# Patient Record
Sex: Female | Born: 1965
Health system: Southern US, Community
[De-identification: ages and names within clinical notes are randomized; demographics above are authoritative.]

## PROBLEM LIST (undated history)

## (undated) DIAGNOSIS — F419 Anxiety disorder, unspecified: Secondary | ICD-10-CM

## (undated) DIAGNOSIS — D649 Anemia, unspecified: Secondary | ICD-10-CM

## (undated) DIAGNOSIS — Z808 Family history of malignant neoplasm of other organs or systems: Secondary | ICD-10-CM

## (undated) DIAGNOSIS — Z800 Family history of malignant neoplasm of digestive organs: Secondary | ICD-10-CM

## (undated) DIAGNOSIS — E079 Disorder of thyroid, unspecified: Secondary | ICD-10-CM

## (undated) DIAGNOSIS — J45909 Unspecified asthma, uncomplicated: Secondary | ICD-10-CM

## (undated) DIAGNOSIS — R32 Unspecified urinary incontinence: Secondary | ICD-10-CM

## (undated) HISTORY — DX: Disorder of thyroid, unspecified: E07.9

## (undated) HISTORY — DX: Unspecified asthma, uncomplicated: J45.909

## (undated) HISTORY — DX: Anemia, unspecified: D64.9

## (undated) HISTORY — DX: Family history of malignant neoplasm of other organs or systems: Z80.8

## (undated) HISTORY — DX: Anxiety disorder, unspecified: F41.9

## (undated) HISTORY — DX: Unspecified urinary incontinence: R32

## (undated) HISTORY — DX: Family history of malignant neoplasm of digestive organs: Z80.0

## (undated) HISTORY — PX: ANKLE ARTHROSCOPY WITH RECONSTRUCTION: SHX5583

---

## 2013-12-05 ENCOUNTER — Encounter: Payer: Self-pay | Admitting: Nurse Practitioner

## 2014-02-26 LAB — HM MAMMOGRAPHY

## 2014-11-15 LAB — CBC AND DIFFERENTIAL
HCT: 39 % (ref 36–46)
Hemoglobin: 13 g/dL (ref 12.0–16.0)
Platelets: 213 10*3/uL (ref 150–399)
WBC: 7 10*3/mL

## 2014-11-15 LAB — TSH: TSH: 4.01 u[IU]/mL (ref 0.41–5.90)

## 2014-12-31 LAB — BASIC METABOLIC PANEL
CREATININE: 0.8 mg/dL (ref 0.5–1.1)
Glucose: 93 mg/dL
Potassium: 4 mmol/L (ref 3.4–5.3)
Sodium: 139 mmol/L (ref 137–147)

## 2014-12-31 LAB — CBC AND DIFFERENTIAL
HEMATOCRIT: 38 % (ref 36–46)
Hemoglobin: 12.6 g/dL (ref 12.0–16.0)
PLATELETS: 244 10*3/uL (ref 150–399)
WBC: 8.2 10*3/mL

## 2015-01-07 ENCOUNTER — Encounter: Payer: Self-pay | Admitting: Nurse Practitioner

## 2015-01-07 LAB — TSH: TSH: 3.18 u[IU]/mL (ref 0.41–5.90)

## 2015-07-28 ENCOUNTER — Encounter: Payer: Self-pay | Admitting: Family Medicine

## 2015-07-28 ENCOUNTER — Ambulatory Visit (INDEPENDENT_AMBULATORY_CARE_PROVIDER_SITE_OTHER): Payer: BLUE CROSS/BLUE SHIELD | Admitting: Family Medicine

## 2015-07-28 VITALS — BP 120/80 | HR 71 | Temp 98.3°F | Resp 12 | Ht 62.0 in | Wt 127.0 lb

## 2015-07-28 DIAGNOSIS — J45909 Unspecified asthma, uncomplicated: Secondary | ICD-10-CM | POA: Insufficient documentation

## 2015-07-28 DIAGNOSIS — S82891A Other fracture of right lower leg, initial encounter for closed fracture: Secondary | ICD-10-CM | POA: Diagnosis not present

## 2015-07-28 DIAGNOSIS — M2550 Pain in unspecified joint: Secondary | ICD-10-CM | POA: Diagnosis not present

## 2015-07-28 DIAGNOSIS — F419 Anxiety disorder, unspecified: Secondary | ICD-10-CM

## 2015-07-28 DIAGNOSIS — E039 Hypothyroidism, unspecified: Secondary | ICD-10-CM | POA: Insufficient documentation

## 2015-07-28 DIAGNOSIS — J4520 Mild intermittent asthma, uncomplicated: Secondary | ICD-10-CM

## 2015-07-28 DIAGNOSIS — Z3041 Encounter for surveillance of contraceptive pills: Secondary | ICD-10-CM

## 2015-07-28 DIAGNOSIS — G2581 Restless legs syndrome: Secondary | ICD-10-CM

## 2015-07-28 LAB — BASIC METABOLIC PANEL
BUN: 12 mg/dL (ref 6–23)
CALCIUM: 9.4 mg/dL (ref 8.4–10.5)
CO2: 24 meq/L (ref 19–32)
CREATININE: 0.71 mg/dL (ref 0.40–1.20)
Chloride: 104 mEq/L (ref 96–112)
GFR: 92.03 mL/min (ref >60.00)
Glucose, Bld: 83 mg/dL (ref 70–99)
Potassium: 4 mEq/L (ref 3.5–5.1)
SODIUM: 138 meq/L (ref 135–145)

## 2015-07-28 LAB — TSH: TSH: 1.39 u[IU]/mL (ref 0.35–4.50)

## 2015-07-28 LAB — T3, FREE: T3 FREE: 2.7 pg/mL (ref 2.3–4.2)

## 2015-07-28 MED ORDER — FLUOXETINE HCL 90 MG PO CPDR: 90.0000 mg | DELAYED_RELEASE_CAPSULE | ORAL | Status: DC

## 2015-07-28 MED ORDER — FLUTICASONE-SALMETEROL 250-50 MCG/DOSE IN AEPB
1.0000 | INHALATION_SPRAY | Freq: Two times a day (BID) | RESPIRATORY_TRACT | Status: DC
Start: 2015-07-28 — End: 2015-08-04

## 2015-07-28 MED ORDER — NORETHINDRONE 0.35 MG PO TABS: 1.0000 | ORAL_TABLET | Freq: Every day | ORAL | Status: DC

## 2015-07-28 MED ORDER — ALBUTEROL SULFATE HFA 108 (90 BASE) MCG/ACT IN AERS: 2.0000 | INHALATION_SPRAY | Freq: Four times a day (QID) | RESPIRATORY_TRACT | Status: DC | PRN

## 2015-07-28 MED ORDER — LEVOTHYROXINE SODIUM 88 MCG PO TABS: 88.0000 ug | ORAL_TABLET | Freq: Every day | ORAL | Status: DC

## 2015-07-28 MED ORDER — ROPINIROLE HCL 0.25 MG PO TABS: 0.5000 mg | ORAL_TABLET | Freq: Every day | ORAL | Status: DC

## 2015-07-28 NOTE — Patient Instructions (Addendum)
A few things to remember from today's visit:   1. Ankle fracture, right  - Ambulatory referral to Orthopedic Surgery  2. Primary hypothyroidism  - TSH - T3, free - Basic metabolic panel - levothyroxine (SYNTHROID, LEVOTHROID) 88 MCG tablet; Take 1 tablet (88 mcg total) by mouth daily before breakfast.  Dispense: 90 tablet; Refill: 1  3. Restless legs syndrome (RLS)  - rOPINIRole (REQUIP) 0.25 MG tablet; Take 2 tablets (0.5 mg total) by mouth at bedtime.  Dispense: 180 tablet; Refill: 1  4. Extrinsic asthma, mild intermittent, uncomplicated  - Fluticasone-Salmeterol (ADVAIR) 250-50 MCG/DOSE AEPB; Inhale 1 puff into the lungs 2 (two) times daily.  Dispense: 60 each; Refill: 2 - albuterol (PROVENTIL HFA;VENTOLIN HFA) 108 (90 Base) MCG/ACT inhaler; Inhale 2 puffs into the lungs every 6 (six) hours as needed for wheezing or shortness of breath.  Dispense: 2 Inhaler; Refill: 2  5. Encounter for surveillance of contraceptive pills  - norethindrone (NORA-BE) 0.35 MG tablet; Take 1 tablet (0.35 mg total) by mouth daily.  Dispense: 3 Package; Refill: 0  6. Anxiety disorder, unspecified  - FLUoxetine (PROZAC WEEKLY) 90 MG DR capsule; Take 1 capsule (90 mg total) by mouth every 7 (seven) days.  Dispense: 13 capsule; Refill: 0   Ashley Murphy may help with joint pain. Ortho referral placed.  Fall precaution.    If you sign-up for My chart, you can communicate easier with [place] in case you have any question or concern.

## 2015-07-28 NOTE — Progress Notes (Signed)
HPI:   Ashley Murphy is a 50 y.o. female, who is here today to establish care with me, moved from [place].   Former PCP: Dr Waunita Schooner. Last preventive routine visit: 08/2014 with gyn.  Hx of asthma, allergic rhinitis, arthralgias, anxiety that exacerbates a "skin condition": granuloma annularis. Also RLS, currently she is on Requip and has taken it for about 2 years, takes 2 tabs at bedtime, which helps some but still has occasional symptoms.    Concerns today: "weak bones", Knees "gave up". Arthralgias mainly hips,lower back, and knees.Has been going on for years but worse since right ankle fracture in 08/2014.  She is afraid of exercise/walk because feels like ankle is going to twist. She was following with ortho, she needs a referral. Also needs medications refill.  + Stiffness, which is worse after prolonged rest and improve with movement. No edema or erythema. No limitation on daily activities.   Last mammogram according to pt, this year and negative. She is on OCP's, which she has tolerated well, no side effects reported.   Asthma: She is on Advair and Albuterol inh; the latter one she has not need in years but since she moved to this area she has had mild wheezing, more than usual, has not used inhaler.  No cough, dyspnea, or chest pain. Denies severe/frequent headache, visual changes, chest pain, dyspnea, palpitation, claudication, focal weakness, or edema.  + Former smoker.   Hypothyroidism:  Currently she is on Levothyroxine 88 mcg daily. Tolerating medication well, no side effects reported. She has not noted dysphagia, palpitations, abdominal pain, changes in bowel habits, tremor, cold/heat intolerance, or abnormal weight loss.    Anxiety:  Currently she is on Fluoxetine 90 mg weekly, has taken it for 15-20 years for anxiety. Prior medications include: None Hospitalizations due to psychiatric disorders: Denies Hx of bipolar disorder  Denies. Insomnia/sleep disorder occasional but attributed to joint and back aching. Suicidal thoughts/ideation: Denies  She states that the main reason she is taking the medications because anxiety/stress exacerbates skin rash. Mother possible bipolar and son was Dx with bipolar disorder.    Current symptoms: None reported.    Review of Systems  Constitutional: Negative for fever, activity change, appetite change, fatigue and unexpected weight change.  HENT: Negative for facial swelling, mouth sores, nosebleeds and trouble swallowing.   Eyes: Negative for photophobia, pain and visual disturbance.  Respiratory: Positive for wheezing. Negative for cough and shortness of breath.   Cardiovascular: Negative for chest pain, palpitations and leg swelling.  Gastrointestinal: Negative for nausea, vomiting and abdominal pain.  Endocrine: Negative for cold intolerance and heat intolerance.  Genitourinary: Negative for dysuria, vaginal bleeding, vaginal discharge and menstrual problem (LMP 07/15/15).  Musculoskeletal: Positive for back pain and arthralgias. Negative for myalgias, joint swelling and gait problem.  Skin: Negative for color change and wound.  Neurological: Negative for syncope, weakness, numbness and headaches.  Hematological: Does not bruise/bleed easily.  Psychiatric/Behavioral: Positive for sleep disturbance (due to pain, occasional). Negative for hallucinations and confusion. The patient is nervous/anxious.       No current outpatient prescriptions on file prior to visit.   No current facility-administered medications on file prior to visit.     Past Medical History  Diagnosis Date  . Asthma    Not on File  Family History  Problem Relation Age of Onset  . Arthritis Mother   . Cancer Mother   . Arthritis Father   . Cancer Paternal Grandfather  Social History   Social History  . Marital Status: Married    Spouse Name: N/A  . Number of Children: N/A  .  Years of Education: N/A   Social History Main Topics  . Smoking status: Former Research scientist (life sciences)  . Smokeless tobacco: None     Comment: Quit 27 years ago  . Alcohol Use: None  . Drug Use: None  . Sexual Activity: Not Asked   Other Topics Concern  . None   Social History Narrative  . None    Filed Vitals:   07/28/15 0904  BP: 120/80  Pulse: 71  Temp: 98.3 F (36.8 C)  Resp: 12    Body mass index is 23.22 kg/(m^2).  SpO2 Readings from Last 3 Encounters:  07/28/15 98%       Physical Exam  Constitutional: She is oriented to person, place, and time. She appears well-developed and well-nourished. No distress.  HENT:  Head: Atraumatic.  Eyes: Conjunctivae and EOM are normal. Pupils are equal, round, and reactive to light.  Neck: No muscular tenderness present. No thyroid mass and no thyromegaly present.  Cardiovascular: Normal rate and regular rhythm.   No murmur heard. Pulses:      Dorsalis pedis pulses are 2+ on the right side, and 2+ on the left side.  Respiratory: Effort normal and breath sounds normal. No respiratory distress.  GI: Soft. She exhibits no mass. There is no tenderness.  Musculoskeletal: She exhibits no edema.   No tenderness upon palpation of paraspinal muscles. No trigger points. Knee crepitus, moderate, no effusion or erythema, stable. Hips adequate ROM, left hip ROM elicits mild pain. Right ankle mild limitation dorsal flexion, pain with palpation of medial peri malleolar area, no edema or erythema.   Lymphadenopathy:    She has no cervical adenopathy.  Neurological: She is alert and oriented to person, place, and time. She has normal strength.  SLR negative bilateral. Stable gait, antalgic.  Skin: Skin is warm. No erythema.  Psychiatric: Her speech is normal. Her mood appears anxious.  Well groomed, good eye contact.      ASSESSMENT AND PLAN:   Najla was seen today for new patient (initial visit).  Diagnoses and all orders for this  visit:  Polyarthralgia  Chronic, most likely osteoarthritis. [place] is a good option for exercise and may help with OA. We could consider Cymbalta later one but we may need to wean of Fluoxetine. Fall precautions.   Ankle fracture, right  Continue PT exercises at home, referral to Ortho was placed today.  -     Ambulatory referral to Orthopedic Surgery  Primary hypothyroidism  No changes today, further recommendations would be given according to results. F/U in 6-12 months.  -     TSH -     T3, free -     Basic metabolic panel -     levothyroxine (SYNTHROID, LEVOTHROID) 88 MCG tablet; Take 1 tablet (88 mcg total) by mouth daily before breakfast.  Restless legs syndrome (RLS)  Still occasional symptoms. We discussed other treatment options: gabapentin. She prefers to hold on adding medications; so no changes. F/U in 4 months.   -     rOPINIRole (REQUIP) 0.25 MG tablet; Take 2 tablets (0.5 mg total) by mouth at bedtime.  Extrinsic asthma, mild intermittent, uncomplicated  No changes for now, recommend using albuterol inhaler for wheezing as needed. We may need to consider adding Singulair. Follow-up in 4 months.  -     Fluticasone-Salmeterol (ADVAIR) 250-50 MCG/DOSE  AEPB; Inhale 1 puff into the lungs 2 (two) times daily. -     albuterol (PROVENTIL HFA;VENTOLIN HFA) 108 (90 Base) MCG/ACT inhaler; Inhale 2 puffs into the lungs every 6 (six) hours as needed for wheezing or shortness of breath.  Encounter for surveillance of contraceptive pills  We discussed some side effects of immunotherapy, including thrombotic episodes. I agree refilling OCP's.  -     norethindrone (NORA-BE) 0.35 MG tablet; Take 1 tablet (0.35 mg total) by mouth daily.  Anxiety disorder, unspecified  Stable. No changes in current management. F/U in 6-12 months.  -     FLUoxetine (PROZAC WEEKLY) 90 MG DR capsule; Take 1 capsule (90 mg total) by mouth every 7 (seven) days.       -PMHx  reviewed. -Will obtain records from former PCP.        Adelei Scobey G. [place], MD  [facility].

## 2015-07-28 NOTE — Progress Notes (Signed)
Pre visit review using our clinic review tool, if applicable. No additional management support is needed unless otherwise documented below in the visit note. 

## 2015-07-30 ENCOUNTER — Encounter: Payer: Self-pay | Admitting: Family Medicine

## 2015-08-01 ENCOUNTER — Telehealth: Payer: Self-pay | Admitting: Family Medicine

## 2015-08-01 DIAGNOSIS — J4520 Mild intermittent asthma, uncomplicated: Secondary | ICD-10-CM

## 2015-08-01 NOTE — Telephone Encounter (Signed)
Ashley Murphy w/[facility] would like to know if they can increase the Advair prescription from a 30day supply to a 90day supply.

## 2015-08-04 ENCOUNTER — Encounter: Payer: Self-pay | Admitting: Family Medicine

## 2015-08-04 MED ORDER — FLUTICASONE-SALMETEROL 250-50 MCG/DOSE IN AEPB: 1.0000 | INHALATION_SPRAY | Freq: Two times a day (BID) | RESPIRATORY_TRACT | Status: DC

## 2015-08-04 NOTE — Telephone Encounter (Signed)
Rx for 90 days sent in.

## 2015-10-21 ENCOUNTER — Telehealth: Payer: Self-pay

## 2015-10-21 NOTE — Telephone Encounter (Signed)
This is fine. Thanks, BJ

## 2015-10-21 NOTE — Telephone Encounter (Signed)
Patient is requesting to tranfer to [facility] She did not feel she had good chemistry with the [place] office.  Dr. Martinique is that ok with you? Wilfred Lacy is that ok with you?

## 2015-10-24 NOTE — Telephone Encounter (Signed)
Ok with me 

## 2015-10-24 NOTE — Telephone Encounter (Signed)
Patient has an app on 10/9 @9am 

## 2015-11-17 ENCOUNTER — Ambulatory Visit: Payer: BLUE CROSS/BLUE SHIELD | Admitting: Nurse Practitioner

## 2015-11-20 ENCOUNTER — Ambulatory Visit (INDEPENDENT_AMBULATORY_CARE_PROVIDER_SITE_OTHER): Payer: BLUE CROSS/BLUE SHIELD | Admitting: Nurse Practitioner

## 2015-11-20 ENCOUNTER — Encounter: Payer: Self-pay | Admitting: Nurse Practitioner

## 2015-11-20 ENCOUNTER — Other Ambulatory Visit (INDEPENDENT_AMBULATORY_CARE_PROVIDER_SITE_OTHER): Payer: BLUE CROSS/BLUE SHIELD

## 2015-11-20 ENCOUNTER — Other Ambulatory Visit: Payer: Self-pay | Admitting: *Deleted

## 2015-11-20 VITALS — BP 112/76 | HR 88 | Temp 98.2°F | Ht 61.0 in | Wt 124.0 lb

## 2015-11-20 DIAGNOSIS — E039 Hypothyroidism, unspecified: Secondary | ICD-10-CM

## 2015-11-20 DIAGNOSIS — Z0000 Encounter for general adult medical examination without abnormal findings: Secondary | ICD-10-CM | POA: Diagnosis not present

## 2015-11-20 DIAGNOSIS — Z3041 Encounter for surveillance of contraceptive pills: Secondary | ICD-10-CM

## 2015-11-20 DIAGNOSIS — Z23 Encounter for immunization: Secondary | ICD-10-CM

## 2015-11-20 DIAGNOSIS — J4520 Mild intermittent asthma, uncomplicated: Secondary | ICD-10-CM

## 2015-11-20 DIAGNOSIS — N951 Menopausal and female climacteric states: Secondary | ICD-10-CM

## 2015-11-20 DIAGNOSIS — G2581 Restless legs syndrome: Secondary | ICD-10-CM

## 2015-11-20 LAB — COMPREHENSIVE METABOLIC PANEL
ALK PHOS: 45 U/L (ref 39–117)
ALT: 10 U/L (ref 0–35)
AST: 14 U/L (ref 0–37)
Albumin: 4.1 g/dL (ref 3.5–5.2)
BILIRUBIN TOTAL: 0.6 mg/dL (ref 0.2–1.2)
BUN: 17 mg/dL (ref 6–23)
CALCIUM: 9.3 mg/dL (ref 8.4–10.5)
CO2: 28 mEq/L (ref 19–32)
CREATININE: 0.78 mg/dL (ref 0.40–1.20)
Chloride: 105 mEq/L (ref 96–112)
GFR: 82.46 mL/min (ref >60.00)
GLUCOSE: 78 mg/dL (ref 70–99)
Potassium: 4.5 mEq/L (ref 3.5–5.1)
Sodium: 139 mEq/L (ref 135–145)
TOTAL PROTEIN: 7.2 g/dL (ref 6.0–8.3)

## 2015-11-20 LAB — LIPID PANEL
CHOLESTEROL: 178 mg/dL (ref 0–200)
HDL: 58.1 mg/dL (ref >39.00)
LDL CALC: 105 mg/dL — AB (ref 0–99)
NonHDL: 119.9
Total CHOL/HDL Ratio: 3
Triglycerides: 74 mg/dL (ref 0.0–149.0)
VLDL: 14.8 mg/dL (ref 0.0–40.0)

## 2015-11-20 LAB — CBC WITH DIFFERENTIAL/PLATELET
BASOS ABS: 0 10*3/uL (ref 0.0–0.1)
Basophils Relative: 0.2 % (ref 0.0–3.0)
EOS ABS: 0.1 10*3/uL (ref 0.0–0.7)
Eosinophils Relative: 0.7 % (ref 0.0–5.0)
HCT: 37.4 % (ref 36.0–46.0)
Hemoglobin: 12.8 g/dL (ref 12.0–15.0)
LYMPHS ABS: 1.6 10*3/uL (ref 0.7–4.0)
Lymphocytes Relative: 20.3 % (ref 12.0–46.0)
MCHC: 34.2 g/dL (ref 30.0–36.0)
MCV: 92.5 fl (ref 78.0–100.0)
MONO ABS: 0.6 10*3/uL (ref 0.1–1.0)
Monocytes Relative: 8.1 % (ref 3.0–12.0)
NEUTROS ABS: 5.6 10*3/uL (ref 1.4–7.7)
NEUTROS PCT: 70.7 % (ref 43.0–77.0)
PLATELETS: 215 10*3/uL (ref 150.0–400.0)
RBC: 4.04 Mil/uL (ref 3.87–5.11)
RDW: 12.7 % (ref 11.5–15.5)
WBC: 7.9 10*3/uL (ref 4.0–10.5)

## 2015-11-20 LAB — TSH: TSH: 3.06 u[IU]/mL (ref 0.35–4.50)

## 2015-11-20 LAB — T4, FREE: FREE T4: 0.92 ng/dL (ref 0.60–1.60)

## 2015-11-20 MED ORDER — ROPINIROLE HCL 0.25 MG PO TABS: 0.5000 mg | ORAL_TABLET | Freq: Every day | ORAL | 1 refills | Status: DC

## 2015-11-20 MED ORDER — ALBUTEROL SULFATE HFA 108 (90 BASE) MCG/ACT IN AERS: 1.0000 | INHALATION_SPRAY | Freq: Four times a day (QID) | RESPIRATORY_TRACT | 5 refills | Status: DC | PRN

## 2015-11-20 MED ORDER — LEVOTHYROXINE SODIUM 88 MCG PO TABS: 88.0000 ug | ORAL_TABLET | Freq: Every day | ORAL | 1 refills | Status: DC

## 2015-11-20 MED ORDER — FLUOXETINE HCL 90 MG PO CPDR: 90.0000 mg | DELAYED_RELEASE_CAPSULE | ORAL | 1 refills | Status: DC

## 2015-11-20 MED ORDER — NORETHINDRONE 0.35 MG PO TABS: 1.0000 | ORAL_TABLET | Freq: Every day | ORAL | 1 refills | Status: DC

## 2015-11-20 MED ORDER — FLUTICASONE-SALMETEROL 250-50 MCG/DOSE IN AEPB: 1.0000 | INHALATION_SPRAY | Freq: Two times a day (BID) | RESPIRATORY_TRACT | 1 refills | Status: DC

## 2015-11-20 NOTE — Patient Instructions (Signed)
Please sign medical release for old medical records.

## 2015-11-20 NOTE — Progress Notes (Unsigned)
Pre visit review using our clinic review tool, if applicable. No additional management support is needed unless otherwise documented below in the visit note. 

## 2015-11-20 NOTE — Progress Notes (Signed)
Pre visit review using our clinic review tool, if applicable. No additional management support is needed unless otherwise documented below in the visit note. 

## 2015-11-20 NOTE — Progress Notes (Signed)
Subjective:    Patient ID: Ashley Murphy, female    DOB: [birth year], 50 y.o.   MRN: [id number]  Patient presents today for complete physical and medication refill  HPI  Immunizations: (TDAP, Hep C screen, Pneumovax, Influenza, zoster)  Health Maintenance  Topic Date Due  .  Hepatitis C: One time screening is recommended by Center for Disease Control  (CDC) for  adults born from [birth year] through 1967.   1965-07-18  . Pap Smear  12/27/1984  . Colon Cancer Screening  12/27/2013  . HIV Screening  11/08/2016*  . Mammogram  02/09/2017  . Tetanus Vaccine  08/08/2024  . Flu Shot  Completed  *Topic was postponed. The date shown is not the original due date.   Diet:healthy Weight:  Wt Readings from Last 3 Encounters:  11/20/15 124 lb (56.2 kg)  07/28/15 127 lb (57.6 kg)   Exercise:unable to exercise due to right ankle injury 2016 Fall Risk: accidental fall 2016 with right ankle fracture. Fall Risk  11/20/2015  Falls in the past year? No   Depression/Suicide: Depression screen PHQ 2/9 11/20/2015  Decreased Interest 0  Down, Depressed, Hopeless 0  PHQ - 2 Score 0   No flowsheet data found. Colonoscopy (every 5-23yr, >50-7361yr:needed Pap Smear (every 3y32yror >21-29 without HPV, every 61yr42yrr >30-661yr12yrh HPV):up to date per patient, last done 3years ago, normal per patient, records requested. Mammogram (yearly, >461yrs84yrto date per patient, last done 2017, normal per patient. Vision:up to date Dental:up to date Advanced Directive: Advanced Directives 11/20/2015  Does patient have an advance directive? No;Yes  Type of Advance Directive Living will   Sexual History (birth control, marital status, STD):sexually active, married, current use of OCP, post menopausal.   Medications and allergies reviewed with patient and updated if appropriate.  Patient Active Problem List   Diagnosis Date Noted  . Menopausal symptoms 11/21/2015  . Ankle fracture, right 07/28/2015  .  Primary hypothyroidism 07/28/2015  . Restless legs syndrome (RLS) 07/28/2015  . Extrinsic asthma 07/28/2015  . Polyarthralgia 07/28/2015    No current outpatient prescriptions on file prior to visit.   No current facility-administered medications on file prior to visit.     Past Medical History:  Diagnosis Date  . Asthma     Past Surgical History:  Procedure Laterality Date  . ANKLE ARTHROSCOPY WITH RECONSTRUCTION Right     Social History   Social History  . Marital status: Married    Spouse name: N/A  . Number of children: N/A  . Years of education: N/A   Social History Main Topics  . 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 Alcohol use 0.0 oz/week     Comment: socially  . Drug use: No  . Sexual activity: Yes    Birth control/ protection: Pill   Other Topics Concern  . None   Social History Narrative  . None    Family History  Problem Relation Age of Onset  . Arthritis Mother   . Cancer Mother   . Arthritis Father   . Heart disease Father   . Hypertension Father   . Hyperlipidemia Father   . Cancer Paternal Grandfather   . Alzheimer's disease Paternal Grandmother         Review of Systems  Constitutional: Negative for fever, malaise/fatigue and weight loss.  HENT: Negative for congestion and sore throat.   Eyes:  Negative for visual changes  Respiratory: Negative for cough and shortness of breath.   Cardiovascular: Negative for chest pain, palpitations and leg swelling.  Gastrointestinal: Negative for abdominal pain, blood in stool, constipation, diarrhea, heartburn, nausea and vomiting.  Genitourinary: Negative for dysuria, frequency and urgency.  Musculoskeletal: Positive for joint pain. Negative for falls and myalgias.       Ankle and hips since right ankle fracture and reconstruction in 2016. Managed by orthopedist at this time.  Skin: Negative for rash.  Neurological: Negative  for dizziness, sensory change and headaches.  Endo/Heme/Allergies: Does not bruise/bleed easily.  Psychiatric/Behavioral: Negative for depression, substance abuse and suicidal ideas. The patient is not nervous/anxious.     Objective:   Vitals:   11/20/15 0816  BP: 112/76  Pulse: 88  Temp: 98.2 F (36.8 C)    Body mass index is 23.43 kg/m.   Physical Examination:  Physical Exam  Constitutional: She is oriented to person, place, and time and well-developed, well-nourished, and in no distress. No distress.  HENT:  Right Ear: External ear normal.  Left Ear: External ear normal.  Nose: Nose normal.  Mouth/Throat: Oropharynx is clear and moist. No oropharyngeal exudate.  Eyes: Conjunctivae and EOM are normal. Pupils are equal, round, and reactive to light. No scleral icterus.  Neck: Normal range of motion. Neck supple. No thyromegaly present.  Cardiovascular: Normal rate, normal heart sounds and intact distal pulses.   Pulmonary/Chest: Effort normal and breath sounds normal. She exhibits no tenderness.  Abdominal: Soft. Bowel sounds are normal. She exhibits no distension. There is no tenderness.  Musculoskeletal: She exhibits no edema.       Right ankle: She exhibits decreased range of motion. She exhibits no swelling, no ecchymosis and no deformity. Tenderness. Achilles tendon normal.       Feet:  Musculoskeletal exam normal except right ankle as documented  Lymphadenopathy:    She has no cervical adenopathy.  Neurological: She is alert and oriented to person, place, and time. She has normal reflexes. No cranial nerve deficit. Coordination normal.  Limping gait due to right ankle instability.  Skin: Skin is warm and dry.  Psychiatric: Affect and judgment normal.  Vitals reviewed.   ASSESSMENT and PLAN:  Marquisa was seen today for annual exam.  Diagnoses and all orders for this visit:  Encounter for preventative adult health care examination -     Comp Met (CMET);  Future -     CBC w/Diff; Future -     Lipid panel; Future -     Hepatitis C Antibody; Future -     Ambulatory referral to Gastroenterology  Need for prophylactic vaccination and inoculation against influenza -     Flu Vaccine QUAD 36+ mos IM  Mild intermittent extrinsic asthma without complication -     Fluticasone-Salmeterol (ADVAIR) 250-50 MCG/DOSE AEPB; Inhale 1 puff into the lungs 2 (two) times daily. -     albuterol (PROVENTIL HFA;VENTOLIN HFA) 108 (90 Base) MCG/ACT inhaler; Inhale 1-2 puffs into the lungs every 6 (six) hours as needed for wheezing or shortness of breath.  Primary hypothyroidism -     TSH; Future -     T4, free; Future -     levothyroxine (SYNTHROID, LEVOTHROID) 88 MCG tablet; Take 1 tablet (88 mcg total) by mouth daily before breakfast.  Restless legs syndrome (RLS) -     rOPINIRole (REQUIP) 0.25 MG tablet; Take 2 tablets (0.5 mg total) by mouth at bedtime.  Encounter for surveillance of contraceptive  pills -     norethindrone (NORA-BE) 0.35 MG tablet; Take 1 tablet (0.35 mg total) by mouth daily.  Extrinsic asthma, mild intermittent, uncomplicated  Menopausal symptoms -     norethindrone (NORA-BE) 0.35 MG tablet; Take 1 tablet (0.35 mg total) by mouth daily. -     FLUoxetine (PROZAC WEEKLY) 90 MG DR capsule; Take 1 capsule (90 mg total) by mouth every 7 (seven) days.    Primary hypothyroidism Maintain current dose of synthroid  Extrinsic asthma No acute symptoms, controlled Continue current inhalers  Restless legs syndrome (RLS) Continue requip  Menopausal symptoms Continue progesterone and prozac as prescribed  Recent Results (from the past 2160 hour(s))  Comp Met (CMET)     Status: None   Collection Time: 11/20/15  9:25 AM  Result Value Ref Range   Sodium 139 135 - 145 mEq/L   Potassium 4.5 3.5 - 5.1 mEq/L   Chloride 105 96 - 112 mEq/L   CO2 28 19 - 32 mEq/L   Glucose, Bld 78 70 - 99 mg/dL   BUN 17 6 - 23 mg/dL   Creatinine, Ser 0.78  0.40 - 1.20 mg/dL   Total Bilirubin 0.6 0.2 - 1.2 mg/dL   Alkaline Phosphatase 45 39 - 117 U/L   AST 14 0 - 37 U/L   ALT 10 0 - 35 U/L   Total Protein 7.2 6.0 - 8.3 g/dL   Albumin 4.1 3.5 - 5.2 g/dL   Calcium 9.3 8.4 - 10.5 mg/dL   GFR 82.46 >60.00 mL/min  CBC w/Diff     Status: None   Collection Time: 11/20/15  9:25 AM  Result Value Ref Range   WBC 7.9 4.0 - 10.5 K/uL   RBC 4.04 3.87 - 5.11 Mil/uL   Hemoglobin 12.8 12.0 - 15.0 g/dL   HCT 37.4 36.0 - 46.0 %   MCV 92.5 78.0 - 100.0 fl   MCHC 34.2 30.0 - 36.0 g/dL   RDW 12.7 11.5 - 15.5 %   Platelets 215.0 150.0 - 400.0 K/uL   Neutrophils Relative % 70.7 43.0 - 77.0 %   Lymphocytes Relative 20.3 12.0 - 46.0 %   Monocytes Relative 8.1 3.0 - 12.0 %   Eosinophils Relative 0.7 0.0 - 5.0 %   Basophils Relative 0.2 0.0 - 3.0 %   Neutro Abs 5.6 1.4 - 7.7 K/uL   Lymphs Abs 1.6 0.7 - 4.0 K/uL   Monocytes Absolute 0.6 0.1 - 1.0 K/uL   Eosinophils Absolute 0.1 0.0 - 0.7 K/uL   Basophils Absolute 0.0 0.0 - 0.1 K/uL  TSH     Status: None   Collection Time: 11/20/15  9:25 AM  Result Value Ref Range   TSH 3.06 0.35 - 4.50 uIU/mL  T4, free     Status: None   Collection Time: 11/20/15  9:25 AM  Result Value Ref Range   Free T4 0.92 0.60 - 1.60 ng/dL    Comment: Specimens from patients who are undergoing biotin therapy and /or ingesting biotin supplements may contain high levels of biotin.  The higher biotin concentration in these specimens interferes with this Free T4 assay.  Specimens that contain high levels  of biotin may cause false high results for this Free T4 assay.  Please interpret results in light of the total clinical presentation of the patient.    Lipid panel     Status: Abnormal   Collection Time: 11/20/15  9:25 AM  Result Value Ref Range   Cholesterol 178 0 -  200 mg/dL    Comment: ATP III Classification       Desirable:  < 200 mg/dL               Borderline High:  200 - 239 mg/dL          High:  > = 240 mg/dL   Triglycerides  74.0 0.0 - 149.0 mg/dL    Comment: Normal:  <150 mg/dLBorderline High:  150 - 199 mg/dL   HDL 58.10 >39.00 mg/dL   VLDL 14.8 0.0 - 40.0 mg/dL   LDL Cholesterol 105 (H) 0 - 99 mg/dL   Total CHOL/HDL Ratio 3     Comment:                Men          Women1/2 Average Risk     3.4          3.3Average Risk          5.0          4.42X Average Risk          9.6          7.13X Average Risk          15.0          11.0                       NonHDL 119.90     Comment: NOTE:  Non-HDL goal should be 30 mg/dL higher than patient's LDL goal (i.e. LDL goal of < 70 mg/dL, would have non-HDL goal of < 100 mg/dL)  Hepatitis C Antibody     Status: None   Collection Time: 11/20/15  9:25 AM  Result Value Ref Range   HCV Ab NEGATIVE NEGATIVE      Follow up: Return in about 6 months (around 05/20/2016) for hypothyroidism, asthma.  Wilfred Lacy, NP

## 2015-11-21 ENCOUNTER — Other Ambulatory Visit: Payer: Self-pay | Admitting: *Deleted

## 2015-11-21 DIAGNOSIS — N951 Menopausal and female climacteric states: Secondary | ICD-10-CM | POA: Insufficient documentation

## 2015-11-21 DIAGNOSIS — J4520 Mild intermittent asthma, uncomplicated: Secondary | ICD-10-CM

## 2015-11-21 LAB — HEPATITIS C ANTIBODY: HCV AB: NEGATIVE

## 2015-11-21 MED ORDER — ALBUTEROL SULFATE HFA 108 (90 BASE) MCG/ACT IN AERS: 1.0000 | INHALATION_SPRAY | Freq: Four times a day (QID) | RESPIRATORY_TRACT | 1 refills | Status: DC | PRN

## 2015-11-21 NOTE — Telephone Encounter (Signed)
Left msg on triage stating received script for albuterol inhaler, but needing quantity change to a 90 day. Resent script for 90 day,,,/lmb

## 2015-11-21 NOTE — Progress Notes (Signed)
Normal results, see office note

## 2015-11-21 NOTE — Assessment & Plan Note (Signed)
Continue requip

## 2015-11-21 NOTE — Assessment & Plan Note (Addendum)
No acute symptoms, controlled Continue current inhalers

## 2015-11-21 NOTE — Assessment & Plan Note (Signed)
Maintain current dose of synthroid

## 2015-11-21 NOTE — Assessment & Plan Note (Signed)
Continue progesterone and prozac as prescribed

## 2015-11-24 ENCOUNTER — Telehealth: Payer: Self-pay | Admitting: Emergency Medicine

## 2015-11-24 DIAGNOSIS — G2581 Restless legs syndrome: Secondary | ICD-10-CM

## 2015-11-24 NOTE — Telephone Encounter (Signed)
Ok, to resend prescription for 90days. With 1refill. Thank you

## 2015-11-24 NOTE — Telephone Encounter (Signed)
Pharmacy called and wants to know if the patient can get a 90 day supply of rOPINIRole (REQUIP) 0.25 MG tablet instead of 45 day. You can call and give a verbal order. The order number is [id number]. Thanks.

## 2015-11-25 MED ORDER — ROPINIROLE HCL 0.25 MG PO TABS: 0.5000 mg | ORAL_TABLET | Freq: Every day | ORAL | 1 refills | Status: DC

## 2015-11-25 NOTE — Telephone Encounter (Signed)
Done. See meds.  

## 2015-11-25 NOTE — Addendum Note (Signed)
Addended by: Cresenciano Lick on: 11/25/2015 04:58 PM   Modules accepted: Orders

## 2015-11-28 ENCOUNTER — Encounter: Payer: Self-pay | Admitting: Gastroenterology

## 2015-11-28 ENCOUNTER — Ambulatory Visit: Payer: BLUE CROSS/BLUE SHIELD | Admitting: Family Medicine

## 2016-01-14 ENCOUNTER — Ambulatory Visit (AMBULATORY_SURGERY_CENTER): Payer: Self-pay | Admitting: *Deleted

## 2016-01-14 VITALS — Ht 61.0 in | Wt 126.0 lb

## 2016-01-14 DIAGNOSIS — Z1211 Encounter for screening for malignant neoplasm of colon: Secondary | ICD-10-CM

## 2016-01-14 MED ORDER — NA SULFATE-K SULFATE-MG SULF 17.5-3.13-1.6 GM/177ML PO SOLN: ORAL | 0 refills | Status: DC

## 2016-01-14 NOTE — Progress Notes (Signed)
No egg or soy allergy  No home oxygen used  No hx sleep apnea  No diet medications taken  No anesthesia or intubation problems  $15 off coupon given (has [employer])

## 2016-01-16 ENCOUNTER — Encounter: Payer: Self-pay | Admitting: Gastroenterology

## 2016-01-28 ENCOUNTER — Ambulatory Visit (AMBULATORY_SURGERY_CENTER): Payer: BLUE CROSS/BLUE SHIELD | Admitting: Gastroenterology

## 2016-01-28 ENCOUNTER — Other Ambulatory Visit: Payer: Self-pay | Admitting: Gastroenterology

## 2016-01-28 ENCOUNTER — Encounter: Payer: Self-pay | Admitting: Gastroenterology

## 2016-01-28 VITALS — BP 97/62 | HR 68 | Temp 98.4°F | Resp 12 | Ht 61.0 in | Wt 126.0 lb

## 2016-01-28 DIAGNOSIS — K6289 Other specified diseases of anus and rectum: Secondary | ICD-10-CM

## 2016-01-28 DIAGNOSIS — D129 Benign neoplasm of anus and anal canal: Secondary | ICD-10-CM

## 2016-01-28 DIAGNOSIS — Z1212 Encounter for screening for malignant neoplasm of rectum: Secondary | ICD-10-CM | POA: Diagnosis not present

## 2016-01-28 DIAGNOSIS — Z1211 Encounter for screening for malignant neoplasm of colon: Secondary | ICD-10-CM

## 2016-01-28 DIAGNOSIS — D128 Benign neoplasm of rectum: Secondary | ICD-10-CM

## 2016-01-28 MED ORDER — SODIUM CHLORIDE 0.9 % IV SOLN: 500.0000 mL | INTRAVENOUS | Status: DC

## 2016-01-28 NOTE — Progress Notes (Signed)
A/ox3 pleased with MAC, report to [facility]

## 2016-01-28 NOTE — Op Note (Signed)
[place] Patient Name: Ashley Murphy Procedure Date: 01/28/2016 1:26 PM MRN: [id number] Endoscopist: Mauri Pole , MD Age: 50 Referring MD:  Date of Birth: August 08, 1965 Gender: Female Account #: [id number] Procedure:                Colonoscopy Indications:              Screening for colorectal malignant neoplasm, This                            is the patient's first colonoscopy Medicines:                Monitored Anesthesia Care Procedure:                Pre-Anesthesia Assessment:                           - Prior to the procedure, a History and Physical                            was performed, and patient medications and                            allergies were reviewed. The patient's tolerance of                            previous anesthesia was also reviewed. The risks                            and benefits of the procedure and the sedation                            options and risks were discussed with the patient.                            All questions were answered, and informed consent                            was obtained. Prior Anticoagulants: The patient has                            taken no previous anticoagulant or antiplatelet                            agents. ASA Grade Assessment: II - A patient with                            mild systemic disease. After reviewing the risks                            and benefits, the patient was deemed in                            satisfactory condition to undergo the procedure.  After obtaining informed consent, the colonoscope                            was passed under direct vision. Throughout the                            procedure, the patient's blood pressure, pulse, and                            oxygen saturations were monitored continuously. The                            [id number] was introduced through the anus                            and advanced  to the the terminal ileum, with                            identification of the appendiceal orifice and IC                            valve. The colonoscopy was performed without                            difficulty. The patient tolerated the procedure                            well. The quality of the bowel preparation was                            excellent. The terminal ileum, ileocecal valve,                            appendiceal orifice, and rectum were photographed. Scope In: 1:41:33 PM Scope Out: 2:12:04 PM Scope Withdrawal Time: 0 hours 20 minutes 6 seconds  Total Procedure Duration: 0 hours 30 minutes 31 seconds  Findings:                 The perianal and digital rectal examinations were                            normal.                           Non-bleeding internal hemorrhoids were found during                            retroflexion. The hemorrhoids were small.                           Anal papilla(e) were hypertrophied. Biopsies were                            taken with a cold forceps for histology.  The exam was otherwise without abnormality. Complications:            No immediate complications. Estimated Blood Loss:     Estimated blood loss was minimal. Impression:               - Non-bleeding internal hemorrhoids.                           - Anal papilla(e) were hypertrophied. Biopsied.                           - The examination was otherwise normal. Recommendation:           - Patient has a contact number available for                            emergencies. The signs and symptoms of potential                            delayed complications were discussed with the                            patient. Return to normal activities tomorrow.                            Written discharge instructions were provided to the                            patient.                           - Resume previous diet.                           - Continue  present medications.                           - Await pathology results.                           - Repeat colonoscopy for surveillance based on                            pathology results.                           - Return to GI clinic PRN. Mauri Pole, MD 01/28/2016 2:20:50 PM This report has been signed electronically.

## 2016-01-28 NOTE — Progress Notes (Signed)
Called to room to assist during endoscopic procedure.  Patient ID and intended procedure confirmed with present staff. Received instructions for my participation in the procedure from the performing physician.  

## 2016-01-28 NOTE — Patient Instructions (Signed)
YOU HAD AN ENDOSCOPIC PROCEDURE TODAY AT [facility]:   Refer to the procedure report that was given to you for any specific questions about what was found during the examination.  If the procedure report does not answer your questions, please call your gastroenterologist to clarify.  If you requested that your care partner not be given the details of your procedure findings, then the procedure report has been included in a sealed envelope for you to review at your convenience later.  YOU SHOULD EXPECT: Some feelings of bloating in the abdomen. Passage of more gas than usual.  Walking can help get rid of the air that was put into your GI tract during the procedure and reduce the bloating. If you had a lower endoscopy (such as a colonoscopy or flexible sigmoidoscopy) you may notice spotting of blood in your stool or on the toilet paper. If you underwent a bowel prep for your procedure, you may not have a normal bowel movement for a few days.  Please Note:  You might notice some irritation and congestion in your nose or some drainage.  This is from the oxygen used during your procedure.  There is no need for concern and it should clear up in a day or so.  SYMPTOMS TO REPORT IMMEDIATELY:   Following lower endoscopy (colonoscopy or flexible sigmoidoscopy):  Excessive amounts of blood in the stool  Significant tenderness or worsening of abdominal pains  Swelling of the abdomen that is new, acute  Fever of 100F or higher  For urgent or emergent issues, a gastroenterologist can be reached at any hour by calling [phone].   DIET:  We do recommend a small meal at first, but then you may proceed to your regular diet.  Drink plenty of fluids but you should avoid alcoholic beverages for 24 hours.  ACTIVITY:  You should plan to take it easy for the rest of today and you should NOT DRIVE or use heavy machinery until tomorrow (because of the sedation medicines used during the test).     FOLLOW UP: Our staff will call the number listed on your records the next business day following your procedure to check on you and address any questions or concerns that you may have regarding the information given to you following your procedure. If we do not reach you, we will leave a message.  However, if you are feeling well and you are not experiencing any problems, there is no need to return our call.  We will assume that you have returned to your regular daily activities without incident.  If any biopsies were taken you will be contacted by phone or by letter within the next 1-3 weeks.  Please call us at [phone] if you have not heard about the biopsies in 3 weeks.    SIGNATURES/CONFIDENTIALITY: You and/or your care partner have signed paperwork which will be entered into your electronic medical record.  These signatures attest to the fact that that the information above on your After Visit Summary has been reviewed and is understood.  Full responsibility of the confidentiality of this discharge information lies with you and/or your care-partner.  Hemorrhoids (handout given) Await biopsy results to determined next Colonoscopy

## 2016-01-29 ENCOUNTER — Telehealth: Payer: Self-pay

## 2016-01-29 NOTE — Telephone Encounter (Signed)
Left message on answering machine. 

## 2016-02-05 ENCOUNTER — Encounter: Payer: Self-pay | Admitting: Gastroenterology

## 2016-02-23 ENCOUNTER — Encounter: Payer: Self-pay | Admitting: Nurse Practitioner

## 2016-02-23 NOTE — Progress Notes (Unsigned)
Abstracted result and sent to scan  

## 2016-04-15 ENCOUNTER — Encounter: Payer: Self-pay | Admitting: Nurse Practitioner

## 2016-04-15 ENCOUNTER — Ambulatory Visit (INDEPENDENT_AMBULATORY_CARE_PROVIDER_SITE_OTHER): Payer: BLUE CROSS/BLUE SHIELD | Admitting: Nurse Practitioner

## 2016-04-15 ENCOUNTER — Other Ambulatory Visit: Payer: BLUE CROSS/BLUE SHIELD

## 2016-04-15 VITALS — BP 110/84 | HR 73 | Temp 98.1°F | Ht 61.0 in | Wt 130.0 lb

## 2016-04-15 DIAGNOSIS — M791 Myalgia, unspecified site: Secondary | ICD-10-CM

## 2016-04-15 DIAGNOSIS — E039 Hypothyroidism, unspecified: Secondary | ICD-10-CM | POA: Diagnosis not present

## 2016-04-15 DIAGNOSIS — R5383 Other fatigue: Secondary | ICD-10-CM

## 2016-04-15 DIAGNOSIS — M2550 Pain in unspecified joint: Secondary | ICD-10-CM | POA: Diagnosis not present

## 2016-04-15 NOTE — Progress Notes (Signed)
Pre visit review using our clinic review tool, if applicable. No additional management support is needed unless otherwise documented below in the visit note. 

## 2016-04-15 NOTE — Patient Instructions (Addendum)
You will be called with lab results.  You nay use naproxen 220mg  oral 2tabs every 12hrs prn for pain. (take with food).   OA vs RA vs fibromyalgia?  Prescribe oral prednisone vs meloxicam vs cymbalta vs lyrica.  Arthritis Arthritis is a term that is commonly used to refer to joint pain or joint disease. There are more than 100 types of arthritis. What are the causes? The most common cause of this condition is wear and tear of a joint. Other causes include:  Gout.  Inflammation of a joint.  An infection of a joint.  Sprains and other injuries near the joint.  A drug reaction or allergic reaction. In some cases, the cause may not be known. What are the signs or symptoms? The main symptom of this condition is pain in the joint with movement. Other symptoms include:  Redness, swelling, or stiffness at a joint.  Warmth coming from the joint.  Fever.  Overall feeling of illness. How is this diagnosed? This condition may be diagnosed with a physical exam and tests, including:  Blood tests.  Urine tests.  Imaging tests, such as MRI, X-rays, or a CT scan. Sometimes, fluid is removed from a joint for testing. How is this treated? Treatment for this condition may involve:  Treatment of the cause, if it is known.  Rest.  Raising (elevating) the joint.  Applying cold or hot packs to the joint.  Medicines to improve symptoms and reduce inflammation.  Injections of a steroid such as cortisone into the joint to help reduce pain and inflammation. Depending on the cause of your arthritis, you may need to make lifestyle changes to reduce stress on your joint. These changes may include exercising more and losing weight. Follow these instructions at home: Medicines   Take over-the-counter and prescription medicines only as told by your health care provider.  Do not take aspirin to relieve pain if gout is suspected. Activity   Rest your joint if told by your health care  provider. Rest is important when your disease is active and your joint feels painful, swollen, or stiff.  Avoid activities that make the pain worse. It is important to balance activity with rest.  Exercise your joint regularly with range-of-motion exercises as told by your health care provider. Try doing low-impact exercise, such as:  Swimming.  Water aerobics.  Biking.  Walking. Joint Care    If your joint is swollen, keep it elevated if told by your health care provider.  If your joint feels stiff in the morning, try taking a warm shower.  If directed, apply heat to the joint. If you have diabetes, do not apply heat without permission from your health care provider.  Put a towel between the joint and the hot pack or heating pad.  Leave the heat on the area for 20-30 minutes.  If directed, apply ice to the joint:  Put ice in a plastic bag.  Place a towel between your skin and the bag.  Leave the ice on for 20 minutes, 2-3 times per day.  Keep all follow-up visits as told by your health care provider. This is important. Contact a health care provider if:  The pain gets worse.  You have a fever. Get help right away if:  You develop severe joint pain, swelling, or redness.  Many joints become painful and swollen.  You develop severe back pain.  You develop severe weakness in your leg.  You cannot control your bladder or bowels. This  information is not intended to replace advice given to you by your health care provider. Make sure you discuss any questions you have with your health care provider. Document Released: 03/04/2004 Document Revised: 07/03/2015 Document Reviewed: 04/22/2014 Elsevier Interactive Patient Education  2017 [employer].

## 2016-04-15 NOTE — Progress Notes (Signed)
Subjective:  Patient ID: Ashley Murphy, female    DOB: Dec 01, 1965  Age: 51 y.o. MRN: [id number]  CC: Pain (knee, right hip,shoulder and right side neck pain,sore---going for a for sometimes but getting worse. )   Muscle Pain  This is a chronic problem. The current episode started more than 1 year ago. The problem occurs constantly. The problem has been gradually worsening since onset. The context of the pain is unknown. Pain location: generlaized muscle and joint pain, worse in knees and hips. The symptoms are aggravated by any movement and exercise. Associated symptoms include fatigue and stiffness. Pertinent negatives include no abdominal pain, chest pain, constipation, diarrhea, dysuria, eye pain, fever, headaches, joint swelling, nausea, rash, sensory change, shortness of breath, swollen glands, urinary symptoms, vaginal discharge, visual change, vomiting, weakness or wheezing. Past treatments include OTC NSAID and acetaminophen (and exercise). The treatment provided mild relief. There is no swelling present.   FH of RA (grandmother).  Outpatient Medications Prior to Visit  Medication Sig Dispense Refill  . albuterol (PROVENTIL HFA;VENTOLIN HFA) 108 (90 Base) MCG/ACT inhaler Inhale 1-2 puffs into the lungs every 6 (six) hours as needed for wheezing or shortness of breath. 3 Inhaler 1  . levothyroxine (SYNTHROID, LEVOTHROID) 88 MCG tablet Take 1 tablet (88 mcg total) by mouth daily before breakfast. 90 tablet 1  . Multiple Vitamins-Minerals (MULTIVITAMIN PO) Take by mouth daily.    . norethindrone (NORA-BE) 0.35 MG tablet Take 1 tablet (0.35 mg total) by mouth daily. 3 Package 1  . rOPINIRole (REQUIP) 0.25 MG tablet Take 2 tablets (0.5 mg total) by mouth at bedtime. 180 tablet 1  . FLUoxetine (PROZAC WEEKLY) 90 MG DR capsule Take 1 capsule (90 mg total) by mouth every 7 (seven) days. 12 capsule 1  . Fluticasone-Salmeterol (ADVAIR) 250-50 MCG/DOSE AEPB Inhale 1 puff into the lungs 2  (two) times daily. (Patient not taking: Reported on 01/28/2016) 180 each 1   Facility-Administered Medications Prior to Visit  Medication Dose Route Frequency Provider Last Rate Last Dose  . 0.9 %  sodium chloride infusion  500 mL Intravenous Continuous Kavitha Nandigam V, MD        ROS See HPI  Objective:  BP 110/84   Pulse 73   Temp 98.1 F (36.7 C)   Ht '5\' 1"'$  (1.549 m)   Wt 130 lb (59 kg)   SpO2 99%   BMI 24.56 kg/m   BP Readings from Last 3 Encounters:  04/15/16 110/84  01/28/16 97/62  11/20/15 112/76    Wt Readings from Last 3 Encounters:  04/15/16 130 lb (59 kg)  01/28/16 126 lb (57.2 kg)  01/14/16 126 lb (57.2 kg)    Physical Exam  Musculoskeletal: Normal range of motion.  Physical Exam  Constitutional: She is oriented to person, place, and time. No distress.  Eyes: No scleral icterus.  Neck: Normal range of motion. Neck supple. No thyromegaly present.  Cardiovascular: Normal rate, regular rhythm and normal heart sounds.   Pulmonary/Chest: Effort normal and breath sounds normal.  Abdominal: Soft. Bowel sounds are normal.  Musculoskeletal: She exhibits tenderness. She exhibits no edema.       Right shoulder: She exhibits tenderness. She exhibits normal range of motion, no bony tenderness, no swelling, no effusion, no crepitus, normal pulse and normal strength.       Left shoulder: She exhibits tenderness. She exhibits normal range of motion, no bony tenderness, no swelling, no effusion, no crepitus, no spasm, normal pulse and normal strength.  Right hip: She exhibits tenderness. She exhibits normal range of motion, normal strength, no bony tenderness, no swelling, no crepitus and no deformity.       Left hip: She exhibits tenderness. She exhibits normal range of motion, normal strength, no bony tenderness, no swelling and no crepitus.       Right ankle: Tenderness.       Left ankle: Tenderness.       Cervical back: She exhibits tenderness. She exhibits no  bony tenderness, no swelling and no edema.       Thoracic back: Normal.       Lumbar back: Normal.  Neurological: She is alert and oriented to person, place, and time. No cranial nerve deficit. Gait normal.  Skin: Skin is warm and dry. No rash noted. No erythema.  Vitals reviewed.   Lab Results  Component Value Date   WBC 7.9 11/20/2015   HGB 12.8 11/20/2015   HCT 37.4 11/20/2015   PLT 215.0 11/20/2015   GLUCOSE 78 11/20/2015   CHOL 178 11/20/2015   TRIG 74.0 11/20/2015   HDL 58.10 11/20/2015   LDLCALC 105 (H) 11/20/2015   ALT 10 11/20/2015   AST 14 11/20/2015   NA 139 11/20/2015   K 4.5 11/20/2015   CL 105 11/20/2015   CREATININE 0.78 11/20/2015   BUN 17 11/20/2015   CO2 28 11/20/2015   TSH 3.06 11/20/2015    Patient was never admitted.  Assessment & Plan:   Ashley Murphy was seen today for pain.  Diagnoses and all orders for this visit:  Polyarthralgia -     CK Total (and CKMB); Future -     Arthritis Panel; Future -     Thyroid Profile; Future  Myalgia -     CK Total (and CKMB); Future -     Arthritis Panel; Future -     Thyroid Profile; Future  Fatigue, unspecified type -     CK Total (and CKMB); Future -     Arthritis Panel; Future -     Thyroid Profile; Future  Primary hypothyroidism -     Thyroid Profile; Future   I am having Ashley Murphy maintain her norethindrone, levothyroxine, Fluticasone-Salmeterol, FLUoxetine, albuterol, rOPINIRole, Multiple Vitamins-Minerals (MULTIVITAMIN PO), and SUPREP BOWEL PREP KIT. We will continue to administer sodium chloride.  Meds ordered this encounter  Medications  . SUPREP BOWEL PREP KIT 17.5-3.13-1.6 GM/180ML SOLN    Sig: USE UTD    Refill:  0    Follow-up: Return if symptoms worsen or fail to improve.  Wilfred Lacy, NP

## 2016-04-16 LAB — THYROID PROFILE - CHCC
Free Thyroxine Index: 2.7 (ref 1.4–3.8)
T3 Uptake: 31 % (ref 22–35)
T4, Total: 8.7 ug/dL (ref 4.5–12.0)

## 2016-04-16 LAB — CK TOTAL AND CKMB (NOT AT ARMC)
CK, MB: <0.7 ng/mL (ref 0.0–5.0)
Total CK: 59 U/L (ref 7–177)

## 2016-04-19 NOTE — Progress Notes (Deleted)
Subjective:  Patient ID: Rolinda Roan, female    DOB: [birth year]  Age: 51 y.o. MRN: [id number]  CC: Pain (knee, right hip,shoulder and right side neck pain,sore---going for a for sometimes but getting worse. )   Muscle Pain  This is a chronic problem. The current episode started more than 1 year ago. The problem occurs constantly. The problem has been gradually worsening since onset. The context of the pain is unknown. Pain location: generlaized muscle and joint pain, worse in knees and hips. The symptoms are aggravated by any movement and exercise. Associated symptoms include fatigue and stiffness. Pertinent negatives include no abdominal pain, chest pain, constipation, diarrhea, dysuria, eye pain, fever, headaches, joint swelling, nausea, rash, sensory change, shortness of breath, swollen glands, urinary symptoms, vaginal discharge, visual change, vomiting, weakness or wheezing. Past treatments include OTC NSAID and acetaminophen (and exercise). The treatment provided mild relief. There is no swelling present.   FH of RA (grandmother).  Outpatient Medications Prior to Visit  Medication Sig Dispense Refill  . albuterol (PROVENTIL HFA;VENTOLIN HFA) 108 (90 Base) MCG/ACT inhaler Inhale 1-2 puffs into the lungs every 6 (six) hours as needed for wheezing or shortness of breath. 3 Inhaler 1  . levothyroxine (SYNTHROID, LEVOTHROID) 88 MCG tablet Take 1 tablet (88 mcg total) by mouth daily before breakfast. 90 tablet 1  . Multiple Vitamins-Minerals (MULTIVITAMIN PO) Take by mouth daily.    . norethindrone (NORA-BE) 0.35 MG tablet Take 1 tablet (0.35 mg total) by mouth daily. 3 Package 1  . rOPINIRole (REQUIP) 0.25 MG tablet Take 2 tablets (0.5 mg total) by mouth at bedtime. 180 tablet 1  . FLUoxetine (PROZAC WEEKLY) 90 MG DR capsule Take 1 capsule (90 mg total) by mouth every 7 (seven) days. 12 capsule 1  . Fluticasone-Salmeterol (ADVAIR) 250-50 MCG/DOSE AEPB Inhale 1 puff into the lungs 2  (two) times daily. (Patient not taking: Reported on 01/28/2016) 180 each 1   Facility-Administered Medications Prior to Visit  Medication Dose Route Frequency Provider Last Rate Last Dose  . 0.9 %  sodium chloride infusion  500 mL Intravenous Continuous Kavitha Nandigam V, MD        ROS See HPI  Objective:  BP 110/84   Pulse 73   Temp 98.1 F (36.7 C)   Ht '5\' 1"'$  (1.549 m)   Wt 130 lb (59 kg)   SpO2 99%   BMI 24.56 kg/m   BP Readings from Last 3 Encounters:  04/15/16 110/84  01/28/16 97/62  11/20/15 112/76    Wt Readings from Last 3 Encounters:  04/15/16 130 lb (59 kg)  01/28/16 126 lb (57.2 kg)  01/14/16 126 lb (57.2 kg)    Physical Exam  Musculoskeletal: Normal range of motion.    Lab Results  Component Value Date   WBC 7.9 11/20/2015   HGB 12.8 11/20/2015   HCT 37.4 11/20/2015   PLT 215.0 11/20/2015   GLUCOSE 78 11/20/2015   CHOL 178 11/20/2015   TRIG 74.0 11/20/2015   HDL 58.10 11/20/2015   LDLCALC 105 (H) 11/20/2015   ALT 10 11/20/2015   AST 14 11/20/2015   NA 139 11/20/2015   K 4.5 11/20/2015   CL 105 11/20/2015   CREATININE 0.78 11/20/2015   BUN 17 11/20/2015   CO2 28 11/20/2015   TSH 3.06 11/20/2015    Patient was never admitted.  Assessment & Plan:   Khiara was seen today for pain.  Diagnoses and all orders for this visit:  Polyarthralgia -  CK Total (and CKMB); Future -     Arthritis Panel; Future -     Thyroid Profile; Future  Myalgia -     CK Total (and CKMB); Future -     Arthritis Panel; Future -     Thyroid Profile; Future  Fatigue, unspecified type -     CK Total (and CKMB); Future -     Arthritis Panel; Future -     Thyroid Profile; Future  Primary hypothyroidism -     Thyroid Profile; Future   I am having Ms. Sandell maintain her norethindrone, levothyroxine, Fluticasone-Salmeterol, FLUoxetine, albuterol, rOPINIRole, Multiple Vitamins-Minerals (MULTIVITAMIN PO), and SUPREP BOWEL PREP KIT. We will continue to  administer sodium chloride.  Meds ordered this encounter  Medications  . SUPREP BOWEL PREP KIT 17.5-3.13-1.6 GM/180ML SOLN    Sig: USE UTD    Refill:  0    Follow-up: Return if symptoms worsen or fail to improve.  Wilfred Lacy, NP

## 2016-04-20 ENCOUNTER — Telehealth: Payer: Self-pay

## 2016-04-20 DIAGNOSIS — R5383 Other fatigue: Secondary | ICD-10-CM

## 2016-04-20 DIAGNOSIS — M791 Myalgia, unspecified site: Secondary | ICD-10-CM

## 2016-04-20 DIAGNOSIS — M2550 Pain in unspecified joint: Secondary | ICD-10-CM

## 2016-04-20 NOTE — Telephone Encounter (Signed)
Ok to enter orders and please notify patient about need to redraw labs. Thank you

## 2016-04-20 NOTE — Telephone Encounter (Signed)
Ashley Murphy with Ashley Murphy called and stated that the Arthitis Panel is no longer a valid test with [facility]. He stated that each portion of the test will need to be ordered separately.   The test he stated would need to be ordered is: Uric Acid (23180) Sed Rate (15010) ANA Screen (23900) Rheumatoid Factor (35597)  Can enter tests for you if you approve. All will need to be redrawn due to the age of original specimen that was collected.

## 2016-04-21 ENCOUNTER — Other Ambulatory Visit (INDEPENDENT_AMBULATORY_CARE_PROVIDER_SITE_OTHER): Payer: BLUE CROSS/BLUE SHIELD

## 2016-04-21 DIAGNOSIS — M791 Myalgia, unspecified site: Secondary | ICD-10-CM

## 2016-04-21 DIAGNOSIS — M2550 Pain in unspecified joint: Secondary | ICD-10-CM

## 2016-04-21 DIAGNOSIS — R5383 Other fatigue: Secondary | ICD-10-CM | POA: Diagnosis not present

## 2016-04-21 LAB — URIC ACID: Uric Acid, Serum: 3.7 mg/dL (ref 2.4–7.0)

## 2016-04-21 LAB — SEDIMENTATION RATE: Sed Rate: 6 mm/hr (ref 0–30)

## 2016-04-21 NOTE — Telephone Encounter (Signed)
Notified pt of lab redraw. She will be in as soon as possible.

## 2016-04-22 ENCOUNTER — Telehealth: Payer: Self-pay | Admitting: Nurse Practitioner

## 2016-04-22 DIAGNOSIS — M2550 Pain in unspecified joint: Secondary | ICD-10-CM

## 2016-04-22 LAB — ANA: ANA: NEGATIVE

## 2016-04-22 LAB — RHEUMATOID FACTOR: Rhuematoid fact SerPl-aCnc: <14 IU/mL (ref <14)

## 2016-04-22 MED ORDER — MELOXICAM 7.5 MG PO TABS: 7.5000 mg | ORAL_TABLET | Freq: Every day | ORAL | 1 refills | Status: DC | PRN

## 2016-04-22 MED ORDER — TIZANIDINE HCL 4 MG PO CAPS: 4.0000 mg | ORAL_CAPSULE | Freq: Three times a day (TID) | ORAL | 1 refills | Status: DC | PRN

## 2016-04-22 NOTE — Assessment & Plan Note (Signed)
Normal arthritis panel. Start mobic and zanaflex prn

## 2016-04-22 NOTE — Telephone Encounter (Signed)
-----   Message from Shawnie Pons, LPN sent at 06/01/5359  1:20 PM EDT ----- Pt verbalize understand of test result.   Pt stated she will go with which ever med Ashley Murphy's recommend to help with this pain. Please send into [facility] on [address]

## 2016-04-22 NOTE — Telephone Encounter (Signed)
Pt verbalize understand of test result.   

## 2016-04-26 ENCOUNTER — Encounter: Payer: Self-pay | Admitting: Nurse Practitioner

## 2016-04-26 DIAGNOSIS — M2550 Pain in unspecified joint: Secondary | ICD-10-CM

## 2016-04-26 MED ORDER — GABAPENTIN 100 MG PO CAPS: 100.0000 mg | ORAL_CAPSULE | Freq: Three times a day (TID) | ORAL | 3 refills | Status: DC

## 2016-05-21 ENCOUNTER — Encounter: Payer: Self-pay | Admitting: Nurse Practitioner

## 2016-05-21 ENCOUNTER — Ambulatory Visit (INDEPENDENT_AMBULATORY_CARE_PROVIDER_SITE_OTHER): Payer: BLUE CROSS/BLUE SHIELD | Admitting: Nurse Practitioner

## 2016-05-21 ENCOUNTER — Other Ambulatory Visit (HOSPITAL_COMMUNITY)
Admission: RE | Admit: 2016-05-21 | Discharge: 2016-05-21 | Disposition: A | Discharge status: Home / Self Care | Payer: BLUE CROSS/BLUE SHIELD | Source: Ambulatory Visit | Attending: Nurse Practitioner | Admitting: Nurse Practitioner

## 2016-05-21 VITALS — BP 114/80 | HR 69 | Temp 98.3°F | Ht 61.0 in | Wt 129.0 lb

## 2016-05-21 DIAGNOSIS — N898 Other specified noninflammatory disorders of vagina: Secondary | ICD-10-CM

## 2016-05-21 DIAGNOSIS — N951 Menopausal and female climacteric states: Secondary | ICD-10-CM | POA: Diagnosis not present

## 2016-05-21 DIAGNOSIS — M791 Myalgia, unspecified site: Secondary | ICD-10-CM

## 2016-05-21 DIAGNOSIS — Z3041 Encounter for surveillance of contraceptive pills: Secondary | ICD-10-CM

## 2016-05-21 DIAGNOSIS — G2581 Restless legs syndrome: Secondary | ICD-10-CM

## 2016-05-21 DIAGNOSIS — M2550 Pain in unspecified joint: Secondary | ICD-10-CM

## 2016-05-21 DIAGNOSIS — E039 Hypothyroidism, unspecified: Secondary | ICD-10-CM | POA: Diagnosis not present

## 2016-05-21 DIAGNOSIS — R21 Rash and other nonspecific skin eruption: Secondary | ICD-10-CM

## 2016-05-21 DIAGNOSIS — Z124 Encounter for screening for malignant neoplasm of cervix: Secondary | ICD-10-CM | POA: Insufficient documentation

## 2016-05-21 MED ORDER — FLUOXETINE HCL 90 MG PO CPDR: 90.0000 mg | DELAYED_RELEASE_CAPSULE | ORAL | 1 refills | Status: DC

## 2016-05-21 MED ORDER — LEVOTHYROXINE SODIUM 88 MCG PO TABS: 88.0000 ug | ORAL_TABLET | Freq: Every day | ORAL | 1 refills | Status: DC

## 2016-05-21 MED ORDER — ROPINIROLE HCL 0.25 MG PO TABS: 0.5000 mg | ORAL_TABLET | Freq: Every day | ORAL | 1 refills | Status: DC

## 2016-05-21 MED ORDER — NORETHINDRONE 0.35 MG PO TABS: 1.0000 | ORAL_TABLET | Freq: Every day | ORAL | 1 refills | Status: DC

## 2016-05-21 NOTE — Progress Notes (Signed)
Pre visit review using our clinic review tool, if applicable. No additional management support is needed unless otherwise documented below in the visit note. 

## 2016-05-21 NOTE — Progress Notes (Signed)
Subjective:  Patient ID: Ashley Murphy, female    DOB: 12/12/1965  Age: 51 y.o. MRN: [id number]  CC: Follow-up (6 mo fu/pap?)   HPI She report persistent joint and muscle pain. Pain is worse with prolonged inactivity. She is unable to use gabapentin daily due to increased somnolence. She will like referral to rheumatologist for further diagnositics.  Rash: Chronic, onset several years ago. Generalized rash, associated with occassional itching. Has been evaluated by dermatologist is past. Treated with corticosteriods, and UV light. Waxing and waning. She will like another referral to dermatology.  She will also like PAP done today. Last done over 3years ago per patient, no hx of abnormal pap.  Outpatient Medications Prior to Visit  Medication Sig Dispense Refill  . albuterol (PROVENTIL HFA;VENTOLIN HFA) 108 (90 Base) MCG/ACT inhaler Inhale 1-2 puffs into the lungs every 6 (six) hours as needed for wheezing or shortness of breath. 3 Inhaler 1  . gabapentin (NEURONTIN) 100 MG capsule Take 1 capsule (100 mg total) by mouth 3 (three) times daily. 90 capsule 3  . Multiple Vitamins-Minerals (MULTIVITAMIN PO) Take by mouth daily.    Manus Gunning BOWEL PREP KIT 17.5-3.13-1.6 GM/180ML SOLN USE UTD  0  . tiZANidine (ZANAFLEX) 4 MG capsule Take 1 capsule (4 mg total) by mouth 3 (three) times daily as needed for muscle spasms. 30 capsule 1  . levothyroxine (SYNTHROID, LEVOTHROID) 88 MCG tablet Take 1 tablet (88 mcg total) by mouth daily before breakfast. 90 tablet 1  . norethindrone (NORA-BE) 0.35 MG tablet Take 1 tablet (0.35 mg total) by mouth daily. 3 Package 1  . rOPINIRole (REQUIP) 0.25 MG tablet Take 2 tablets (0.5 mg total) by mouth at bedtime. 180 tablet 1  . FLUoxetine (PROZAC WEEKLY) 90 MG DR capsule Take 1 capsule (90 mg total) by mouth every 7 (seven) days. 12 capsule 1  . Fluticasone-Salmeterol (ADVAIR) 250-50 MCG/DOSE AEPB Inhale 1 puff into the lungs 2 (two) times daily. (Patient  not taking: Reported on 01/28/2016) 180 each 1   Facility-Administered Medications Prior to Visit  Medication Dose Route Frequency Provider Last Rate Last Dose  . 0.9 %  sodium chloride infusion  500 mL Intravenous Continuous Kavitha Nandigam V, MD        ROS See HPI  Objective:  BP 114/80   Pulse 69   Temp 98.3 F (36.8 C)   Ht '5\' 1"'$  (1.549 m)   Wt 129 lb (58.5 kg)   SpO2 100%   BMI 24.37 kg/m   BP Readings from Last 3 Encounters:  05/21/16 114/80  04/15/16 110/84  01/28/16 97/62    Wt Readings from Last 3 Encounters:  05/21/16 129 lb (58.5 kg)  04/15/16 130 lb (59 kg)  01/28/16 126 lb (57.2 kg)    Physical Exam  Constitutional: She is oriented to person, place, and time.  Neck: Normal range of motion. Neck supple.  Cardiovascular: Normal rate.   Pulmonary/Chest: Effort normal.  Abdominal: Soft. Bowel sounds are normal.  Genitourinary: Uterus normal. Rectal exam shows external hemorrhoid. There is no rash on the right labia. There is no rash on the left labia. Cervix exhibits discharge. Cervix exhibits no motion tenderness and no friability. Right adnexum displays no mass. Left adnexum displays no mass. No tenderness in the vagina. Vaginal discharge found.  Genitourinary Comments: Yellow, thin vaginal discharge noted during pelvic exam.  Musculoskeletal: She exhibits tenderness. She exhibits no edema.  Neurological: She is alert and oriented to person, place, and time.  Skin: Skin is warm, dry and intact. Rash noted. Rash is macular.  Vitals reviewed.   Lab Results  Component Value Date   WBC 7.9 11/20/2015   HGB 12.8 11/20/2015   HCT 37.4 11/20/2015   PLT 215.0 11/20/2015   GLUCOSE 78 11/20/2015   CHOL 178 11/20/2015   TRIG 74.0 11/20/2015   HDL 58.10 11/20/2015   LDLCALC 105 (H) 11/20/2015   ALT 10 11/20/2015   AST 14 11/20/2015   NA 139 11/20/2015   K 4.5 11/20/2015   CL 105 11/20/2015   CREATININE 0.78 11/20/2015   BUN 17 11/20/2015   CO2 28  11/20/2015   TSH 3.06 11/20/2015    Patient was never admitted.  Assessment & Plan:   Ashley Murphy was seen today for follow-up.  Diagnoses and all orders for this visit:  Polyarthralgia -     Ambulatory referral to Rheumatology  Myalgia -     Ambulatory referral to Rheumatology  Restless legs syndrome (RLS) -     rOPINIRole (REQUIP) 0.25 MG tablet; Take 2 tablets (0.5 mg total) by mouth at bedtime.  Menopausal symptoms -     FLUoxetine (PROZAC WEEKLY) 90 MG DR capsule; Take 1 capsule (90 mg total) by mouth every 7 (seven) days. -     norethindrone (NORA-BE) 0.35 MG tablet; Take 1 tablet (0.35 mg total) by mouth daily.  Primary hypothyroidism -     levothyroxine (SYNTHROID, LEVOTHROID) 88 MCG tablet; Take 1 tablet (88 mcg total) by mouth daily before breakfast.  Encounter for surveillance of contraceptive pills -     norethindrone (NORA-BE) 0.35 MG tablet; Take 1 tablet (0.35 mg total) by mouth daily.  Encounter for Papanicolaou smear for cervical cancer screening -     Cytology - PAP  Vaginal discharge -     Cancel: Cervicovaginal ancillary only; Future -     Cervicovaginal ancillary only  Macular rash -     Ambulatory referral to Dermatology   I have discontinued Ashley Murphy's Fluticasone-Salmeterol. I am also having her maintain her albuterol, Multiple Vitamins-Minerals (MULTIVITAMIN PO), SUPREP BOWEL PREP KIT, tiZANidine, gabapentin, meloxicam, FLUoxetine, levothyroxine, rOPINIRole, and norethindrone. We will continue to administer sodium chloride.  Meds ordered this encounter  Medications  . meloxicam (MOBIC) 7.5 MG tablet    Sig: Take 7.5 mg by mouth as needed.    Refill:  1  . FLUoxetine (PROZAC WEEKLY) 90 MG DR capsule    Sig: Take 1 capsule (90 mg total) by mouth every 7 (seven) days.    Dispense:  12 capsule    Refill:  1    Order Specific Question:   Supervising Provider    Answer:   Cassandria Anger [[id number]]  . levothyroxine (SYNTHROID, LEVOTHROID) 88  MCG tablet    Sig: Take 1 tablet (88 mcg total) by mouth daily before breakfast.    Dispense:  90 tablet    Refill:  1    Order Specific Question:   Supervising Provider    Answer:   Cassandria Anger [[id number]]  . rOPINIRole (REQUIP) 0.25 MG tablet    Sig: Take 2 tablets (0.5 mg total) by mouth at bedtime.    Dispense:  180 tablet    Refill:  1    This rx to replace 11/20/15 rx.    Order Specific Question:   Supervising Provider    Answer:   Cassandria Anger [[id number]]  . norethindrone (NORA-BE) 0.35 MG tablet    Sig: Take 1 tablet (0.35 mg  total) by mouth daily.    Dispense:  3 Package    Refill:  1    Order Specific Question:   Supervising Provider    Answer:   Cassandria Anger [[id number]]   Follow-up: Return in about 6 months (around 11/20/2016) for hypothyroidism.  Wilfred Lacy, NP

## 2016-05-24 LAB — CYTOLOGY - PAP
Chlamydia: NEGATIVE
DIAGNOSIS: NEGATIVE
HPV (WINDOPATH): NOT DETECTED
Neisseria Gonorrhea: NEGATIVE

## 2016-05-24 LAB — CERVICOVAGINAL ANCILLARY ONLY: Wet Prep (BD Affirm): NEGATIVE

## 2016-05-25 ENCOUNTER — Encounter: Payer: Self-pay | Admitting: Nurse Practitioner

## 2016-05-25 DIAGNOSIS — M25551 Pain in right hip: Principal | ICD-10-CM

## 2016-05-25 DIAGNOSIS — G8929 Other chronic pain: Secondary | ICD-10-CM

## 2016-05-26 ENCOUNTER — Ambulatory Visit (INDEPENDENT_AMBULATORY_CARE_PROVIDER_SITE_OTHER)
Admission: RE | Admit: 2016-05-26 | Discharge: 2016-05-26 | Disposition: A | Discharge status: Home / Self Care | Payer: BLUE CROSS/BLUE SHIELD | Source: Ambulatory Visit | Attending: Nurse Practitioner | Admitting: Nurse Practitioner

## 2016-05-26 DIAGNOSIS — M25551 Pain in right hip: Secondary | ICD-10-CM | POA: Diagnosis not present

## 2016-05-26 DIAGNOSIS — G8929 Other chronic pain: Secondary | ICD-10-CM

## 2016-06-01 ENCOUNTER — Encounter: Payer: Self-pay | Admitting: Nurse Practitioner

## 2016-06-14 DIAGNOSIS — M19171 Post-traumatic osteoarthritis, right ankle and foot: Secondary | ICD-10-CM | POA: Diagnosis not present

## 2016-06-17 DIAGNOSIS — M19171 Post-traumatic osteoarthritis, right ankle and foot: Secondary | ICD-10-CM | POA: Diagnosis not present

## 2016-06-29 DIAGNOSIS — D0371 Melanoma in situ of right lower limb, including hip: Secondary | ICD-10-CM | POA: Diagnosis not present

## 2016-06-29 DIAGNOSIS — C4371 Malignant melanoma of right lower limb, including hip: Secondary | ICD-10-CM | POA: Diagnosis not present

## 2016-07-13 DIAGNOSIS — L309 Dermatitis, unspecified: Secondary | ICD-10-CM | POA: Diagnosis not present

## 2016-07-13 DIAGNOSIS — L920 Granuloma annulare: Secondary | ICD-10-CM | POA: Diagnosis not present

## 2016-07-27 DIAGNOSIS — L905 Scar conditions and fibrosis of skin: Secondary | ICD-10-CM | POA: Diagnosis not present

## 2016-07-27 DIAGNOSIS — D0371 Melanoma in situ of right lower limb, including hip: Secondary | ICD-10-CM | POA: Diagnosis not present

## 2016-09-13 DIAGNOSIS — M19171 Post-traumatic osteoarthritis, right ankle and foot: Secondary | ICD-10-CM | POA: Diagnosis not present

## 2016-10-02 ENCOUNTER — Other Ambulatory Visit: Payer: Self-pay | Admitting: Nurse Practitioner

## 2016-10-02 DIAGNOSIS — Z3041 Encounter for surveillance of contraceptive pills: Secondary | ICD-10-CM

## 2016-10-02 DIAGNOSIS — G2581 Restless legs syndrome: Secondary | ICD-10-CM

## 2016-10-02 DIAGNOSIS — E039 Hypothyroidism, unspecified: Secondary | ICD-10-CM

## 2016-10-02 DIAGNOSIS — N951 Menopausal and female climacteric states: Secondary | ICD-10-CM

## 2016-10-04 NOTE — Telephone Encounter (Signed)
Wait until pt comes in tomorrow.

## 2016-10-05 ENCOUNTER — Encounter: Payer: Self-pay | Admitting: Nurse Practitioner

## 2016-10-05 ENCOUNTER — Ambulatory Visit (INDEPENDENT_AMBULATORY_CARE_PROVIDER_SITE_OTHER): Payer: BLUE CROSS/BLUE SHIELD | Admitting: Nurse Practitioner

## 2016-10-05 ENCOUNTER — Other Ambulatory Visit (INDEPENDENT_AMBULATORY_CARE_PROVIDER_SITE_OTHER): Payer: BLUE CROSS/BLUE SHIELD

## 2016-10-05 VITALS — BP 120/82 | HR 73 | Temp 98.3°F | Ht 61.0 in | Wt 124.0 lb

## 2016-10-05 DIAGNOSIS — E039 Hypothyroidism, unspecified: Secondary | ICD-10-CM | POA: Diagnosis not present

## 2016-10-05 DIAGNOSIS — Z3041 Encounter for surveillance of contraceptive pills: Secondary | ICD-10-CM

## 2016-10-05 DIAGNOSIS — N951 Menopausal and female climacteric states: Secondary | ICD-10-CM | POA: Diagnosis not present

## 2016-10-05 DIAGNOSIS — M2550 Pain in unspecified joint: Secondary | ICD-10-CM

## 2016-10-05 DIAGNOSIS — R7989 Other specified abnormal findings of blood chemistry: Secondary | ICD-10-CM | POA: Diagnosis not present

## 2016-10-05 DIAGNOSIS — G2581 Restless legs syndrome: Secondary | ICD-10-CM | POA: Diagnosis not present

## 2016-10-05 LAB — HEPATIC FUNCTION PANEL
ALT: 10 U/L (ref 0–35)
AST: 15 U/L (ref 0–37)
Albumin: 4.6 g/dL (ref 3.5–5.2)
Alkaline Phosphatase: 40 U/L (ref 39–117)
Bilirubin, Direct: 0.1 mg/dL (ref 0.0–0.3)
Total Bilirubin: 0.5 mg/dL (ref 0.2–1.2)
Total Protein: 7.4 g/dL (ref 6.0–8.3)

## 2016-10-05 LAB — TSH: TSH: 3.52 u[IU]/mL (ref 0.35–4.50)

## 2016-10-05 LAB — BASIC METABOLIC PANEL WITH GFR
BUN: 12 mg/dL (ref 6–23)
CO2: 32 meq/L (ref 19–32)
Calcium: 9.7 mg/dL (ref 8.4–10.5)
Chloride: 103 meq/L (ref 96–112)
Creatinine, Ser: 1.41 mg/dL — ABNORMAL HIGH (ref 0.40–1.20)
GFR: 41.5 mL/min — ABNORMAL LOW
Glucose, Bld: 80 mg/dL (ref 70–99)
Potassium: 4 meq/L (ref 3.5–5.1)
Sodium: 140 meq/L (ref 135–145)

## 2016-10-05 LAB — T4, FREE: FREE T4: 0.87 ng/dL (ref 0.60–1.60)

## 2016-10-05 MED ORDER — ROPINIROLE HCL 0.25 MG PO TABS: 0.5000 mg | ORAL_TABLET | Freq: Every day | ORAL | 1 refills | Status: DC

## 2016-10-05 MED ORDER — FLUOXETINE HCL 90 MG PO CPDR: 90.0000 mg | DELAYED_RELEASE_CAPSULE | ORAL | 1 refills | Status: DC

## 2016-10-05 MED ORDER — LEVOTHYROXINE SODIUM 88 MCG PO TABS: 88.0000 ug | ORAL_TABLET | Freq: Every day | ORAL | 1 refills | Status: DC

## 2016-10-05 MED ORDER — NORETHINDRONE 0.35 MG PO TABS
1.0000 | ORAL_TABLET | Freq: Every day | ORAL | 1 refills | Status: DC
Start: 2016-10-05 — End: 2017-06-02

## 2016-10-05 MED ORDER — GABAPENTIN 100 MG PO CAPS: 100.0000 mg | ORAL_CAPSULE | Freq: Three times a day (TID) | ORAL | 1 refills | Status: DC

## 2016-10-05 NOTE — Progress Notes (Signed)
Subjective:  Patient ID: Ashley Murphy, female    DOB: August 16, 1965  Age: 51 y.o. MRN: [id number]  CC: Follow-up (6 mo fu--refill request--thyroid test again?)   HPI  denies any acute complains.  She will be loosing job and Insurance will be out in 62month Will like to have refills for next 3-668monthwhile transitioning to another job and insurance.   Outpatient Medications Prior to Visit  Medication Sig Dispense Refill  . meloxicam (MOBIC) 7.5 MG tablet Take 7.5 mg by mouth as needed.  1  . Multiple Vitamins-Minerals (MULTIVITAMIN PO) Take by mouth daily.    . Marland Kitchenabapentin (NEURONTIN) 100 MG capsule Take 1 capsule (100 mg total) by mouth 3 (three) times daily. 90 capsule 3  . levothyroxine (SYNTHROID, LEVOTHROID) 88 MCG tablet Take 1 tablet (88 mcg total) by mouth daily before breakfast. 90 tablet 1  . norethindrone (NORA-BE) 0.35 MG tablet Take 1 tablet (0.35 mg total) by mouth daily. 3 Package 1  . rOPINIRole (REQUIP) 0.25 MG tablet Take 2 tablets (0.5 mg total) by mouth at bedtime. 180 tablet 1  . SUPREP BOWEL PREP KIT 17.5-3.13-1.6 GM/180ML SOLN USE UTD  0  . albuterol (PROVENTIL HFA;VENTOLIN HFA) 108 (90 Base) MCG/ACT inhaler Inhale 1-2 puffs into the lungs every 6 (six) hours as needed for wheezing or shortness of breath. (Patient not taking: Reported on 10/05/2016) 3 Inhaler 1  . FLUoxetine (PROZAC WEEKLY) 90 MG DR capsule Take 1 capsule (90 mg total) by mouth every 7 (seven) days. 12 capsule 1  . tiZANidine (ZANAFLEX) 4 MG capsule Take 1 capsule (4 mg total) by mouth 3 (three) times daily as needed for muscle spasms. (Patient not taking: Reported on 10/05/2016) 30 capsule 1   Facility-Administered Medications Prior to Visit  Medication Dose Route Frequency Provider Last Rate Last Dose  . 0.9 %  sodium chloride infusion  500 mL Intravenous Continuous Nandigam, Kavitha V, MD        ROS See HPI  Objective:  BP 120/82   Pulse 73   Temp 98.3 F (36.8 C)   Ht _0  (1.549  m)   Wt 124 lb (56.2 kg)   SpO2 100%   BMI 23.43 kg/m   BP Readings from Last 3 Encounters:  10/05/16 120/82  05/21/16 114/80  04/15/16 110/84    Wt Readings from Last 3 Encounters:  10/05/16 124 lb (56.2 kg)  05/21/16 129 lb (58.5 kg)  04/15/16 130 lb (59 kg)    Physical Exam  Constitutional: She is oriented to person, place, and time. No distress.  Neck: No thyromegaly present.  Cardiovascular: Normal rate, regular rhythm and normal heart sounds.   Pulmonary/Chest: Effort normal and breath sounds normal.  Musculoskeletal: She exhibits no edema.  Neurological: She is alert and oriented to person, place, and time.  Skin: Skin is warm and dry.  Vitals reviewed.   Lab Results  Component Value Date   WBC 7.9 11/20/2015   HGB 12.8 11/20/2015   HCT 37.4 11/20/2015   PLT 215.0 11/20/2015   GLUCOSE 80 10/05/2016   CHOL 178 11/20/2015   TRIG 74.0 11/20/2015   HDL 58.10 11/20/2015   LDLCALC 105 (H) 11/20/2015   ALT 10 10/05/2016   AST 15 10/05/2016   NA 140 10/05/2016   K 4.0 10/05/2016   CL 103 10/05/2016   CREATININE 1.41 (H) 10/05/2016   BUN 12 10/05/2016   CO2 32 10/05/2016   TSH 3.52 10/05/2016    Dg Hip Unilat With  Pelvis 2-3 Views Right  Result Date: 05/27/2016 CLINICAL DATA:  Chronic right hip pain for 5 months. No reported recent injury. EXAM: DG HIP (WITH OR WITHOUT PELVIS) 2-3V RIGHT COMPARISON:  None. FINDINGS: No fracture or malalignment in the right hip. No suspicious focal osseous lesion. No significant degenerative or erosive arthropathy. No radiopaque foreign body. IMPRESSION: No fracture, malalignment or significant arthropathy in the right hip. Electronically Signed   By: Ilona Sorrel M.D.   On: 05/27/2016 08:23    Assessment & Plan:   Meleane was seen today for follow-up.  Diagnoses and all orders for this visit:  Primary hypothyroidism -     TSH; Future -     T4, free; Future -     Cancel: Basic metabolic panel; Future -     Hepatic  function panel; Future -     levothyroxine (SYNTHROID, LEVOTHROID) 88 MCG tablet; Take 1 tablet (88 mcg total) by mouth daily before breakfast.  Menopausal symptoms -     Cancel: Basic metabolic panel; Future -     FLUoxetine (PROZAC WEEKLY) 90 MG DR capsule; Take 1 capsule (90 mg total) by mouth every 7 (seven) days. -     norethindrone (NORA-BE) 0.35 MG tablet; Take 1 tablet (0.35 mg total) by mouth daily.  Restless legs syndrome (RLS) -     rOPINIRole (REQUIP) 0.25 MG tablet; Take 2 tablets (0.5 mg total) by mouth at bedtime.  Polyarthralgia -     gabapentin (NEURONTIN) 100 MG capsule; Take 1 capsule (100 mg total) by mouth 3 (three) times daily.  Encounter for surveillance of contraceptive pills -     norethindrone (NORA-BE) 0.35 MG tablet; Take 1 tablet (0.35 mg total) by mouth daily.  Elevated serum creatinine -     Basic metabolic panel; Future -     Basic metabolic panel; Future   I have discontinued Ms. Celestine's albuterol and tiZANidine. I am also having her maintain her Multiple Vitamins-Minerals (MULTIVITAMIN PO), SUPREP BOWEL PREP KIT, meloxicam, FLUoxetine, gabapentin, norethindrone, rOPINIRole, and levothyroxine. We will continue to administer sodium chloride.  Meds ordered this encounter  Medications  . FLUoxetine (PROZAC WEEKLY) 90 MG DR capsule    Sig: Take 1 capsule (90 mg total) by mouth every 7 (seven) days.    Dispense:  12 capsule    Refill:  1    Order Specific Question:   Supervising Provider    Answer:   Cassandria Anger [[id number]]  . gabapentin (NEURONTIN) 100 MG capsule    Sig: Take 1 capsule (100 mg total) by mouth 3 (three) times daily.    Dispense:  90 capsule    Refill:  1    Order Specific Question:   Supervising Provider    Answer:   Cassandria Anger [[id number]]  . norethindrone (NORA-BE) 0.35 MG tablet    Sig: Take 1 tablet (0.35 mg total) by mouth daily.    Dispense:  3 Package    Refill:  1    Order Specific Question:   Supervising  Provider    Answer:   Cassandria Anger [[id number]]  . rOPINIRole (REQUIP) 0.25 MG tablet    Sig: Take 2 tablets (0.5 mg total) by mouth at bedtime.    Dispense:  180 tablet    Refill:  1    Order Specific Question:   Supervising Provider    Answer:   Cassandria Anger [[id number]]  . levothyroxine (SYNTHROID, LEVOTHROID) 88 MCG tablet    Sig: Take 1  tablet (88 mcg total) by mouth daily before breakfast.    Dispense:  90 tablet    Refill:  1    Order Specific Question:   Supervising Provider    Answer:   Cassandria Anger [[id number]]    Follow-up: Return in about 6 months (around 04/07/2017) for CPE (fasting).  Wilfred Lacy, NP

## 2016-10-05 NOTE — Patient Instructions (Addendum)
  Stable thyroid function. Normal hepatic function. BMP indicates increase in creatinine. This could be as result of inadequate oral hydration. Push oral hydration with water mostly, avoid use of NSAIDS, then return to office for repeat BMP in 1week

## 2016-10-06 ENCOUNTER — Telehealth: Payer: Self-pay | Admitting: Nurse Practitioner

## 2016-10-06 NOTE — Telephone Encounter (Signed)
Received request refill for Meloxicam 7.5 mg send to [facility]. We didn't send in this med yesterday can not find note why) but last time we change therapy for this med on 04/26/2016. Please advise.

## 2016-10-06 NOTE — Telephone Encounter (Signed)
Ok to send

## 2016-10-08 MED ORDER — MELOXICAM 7.5 MG PO TABS: 7.5000 mg | ORAL_TABLET | ORAL | 1 refills | Status: DC | PRN

## 2016-10-08 NOTE — Telephone Encounter (Signed)
rx sent in as requested 

## 2016-10-13 ENCOUNTER — Other Ambulatory Visit (INDEPENDENT_AMBULATORY_CARE_PROVIDER_SITE_OTHER): Payer: BLUE CROSS/BLUE SHIELD

## 2016-10-13 DIAGNOSIS — R7989 Other specified abnormal findings of blood chemistry: Secondary | ICD-10-CM

## 2016-10-13 LAB — BASIC METABOLIC PANEL
BUN: 11 mg/dL (ref 6–23)
CALCIUM: 9.3 mg/dL (ref 8.4–10.5)
CHLORIDE: 103 meq/L (ref 96–112)
CO2: 26 meq/L (ref 19–32)
Creatinine, Ser: 0.75 mg/dL (ref 0.40–1.20)
GFR: 85.98 mL/min (ref >60.00)
GLUCOSE: 96 mg/dL (ref 70–99)
POTASSIUM: 3.7 meq/L (ref 3.5–5.1)
SODIUM: 137 meq/L (ref 135–145)

## 2016-10-14 ENCOUNTER — Telehealth: Payer: Self-pay | Admitting: Nurse Practitioner

## 2016-10-14 MED ORDER — MELOXICAM 7.5 MG PO TABS: 7.5000 mg | ORAL_TABLET | Freq: Every day | ORAL | 1 refills | Status: DC | PRN

## 2016-10-14 NOTE — Telephone Encounter (Signed)
rx corrected and [facility].

## 2016-10-14 NOTE — Telephone Encounter (Signed)
meloxicam (MOBIC) 7.5 MG tablet   Pharmacy need to know how this medication needs to be taken. It can say once or twice daily as needed. They need more information.  Please advise. [phone] Pick #1 Order #: [id number]

## 2016-11-22 ENCOUNTER — Ambulatory Visit: Payer: BLUE CROSS/BLUE SHIELD | Admitting: Nurse Practitioner

## 2017-05-12 ENCOUNTER — Telehealth: Payer: Self-pay | Admitting: Internal Medicine

## 2017-05-12 NOTE — Telephone Encounter (Signed)
Dr Burns, °Would you be willing to see this patient to establish care? °

## 2017-05-12 NOTE — Telephone Encounter (Signed)
Copied from [place] [phone]. Topic: Quick Communication - See Telephone Encounter >> May 12, 2017 11:02 AM Antonieta Iba C wrote: CRM for notification. See Telephone encounter for: 05/12/17.  Pt use to see Baldo Ash, pt would like to stay at the [facility] office. Pt called to schedule a follow up visit. Pt would like to know if Dr. Quay Burow would take her on as a pt?   CB: [phone]

## 2017-05-13 NOTE — Telephone Encounter (Signed)
Please let her know I am sorry but can not accept her at this time.  Please give alternatives.

## 2017-05-13 NOTE — Telephone Encounter (Signed)
LM informing patient. Asked her to call back to discuss other transfer options at [facility]. Please connect patient to office.

## 2017-06-02 ENCOUNTER — Other Ambulatory Visit: Payer: Self-pay | Admitting: Nurse Practitioner

## 2017-06-02 DIAGNOSIS — Z3041 Encounter for surveillance of contraceptive pills: Secondary | ICD-10-CM

## 2017-06-02 DIAGNOSIS — N951 Menopausal and female climacteric states: Secondary | ICD-10-CM

## 2017-06-07 ENCOUNTER — Other Ambulatory Visit: Payer: Self-pay | Admitting: Nurse Practitioner

## 2017-06-07 DIAGNOSIS — G2581 Restless legs syndrome: Secondary | ICD-10-CM

## 2017-06-07 DIAGNOSIS — E039 Hypothyroidism, unspecified: Secondary | ICD-10-CM

## 2017-06-08 ENCOUNTER — Telehealth: Payer: Self-pay | Admitting: Family Medicine

## 2017-06-08 ENCOUNTER — Encounter: Payer: Self-pay | Admitting: Family

## 2017-06-08 NOTE — Telephone Encounter (Signed)
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  Patient would like to transfer to [facility].  Please schedule appt to transfer.

## 2017-06-08 NOTE — Telephone Encounter (Signed)
Copied from [place] [id number]. Topic: Appointment Scheduling - Prior Auth Required for Appointment >> Jun 08, 2017  8:01 AM Robina Ade, Helene Kelp D wrote: No appointment has been scheduled. Patient is requesting transfer appointment with either Dr. Juleen China or Aldona Bar. Per scheduling protocol, this appointment requires a prior authorization prior to scheduling.  Route to department's PEC pool.

## 2017-06-10 ENCOUNTER — Ambulatory Visit (INDEPENDENT_AMBULATORY_CARE_PROVIDER_SITE_OTHER): Payer: 59 | Admitting: Family Medicine

## 2017-06-10 ENCOUNTER — Encounter: Payer: Self-pay | Admitting: Family Medicine

## 2017-06-10 VITALS — BP 120/78 | HR 74 | Ht 61.5 in | Wt 122.2 lb

## 2017-06-10 DIAGNOSIS — G2581 Restless legs syndrome: Secondary | ICD-10-CM

## 2017-06-10 DIAGNOSIS — Z0000 Encounter for general adult medical examination without abnormal findings: Secondary | ICD-10-CM | POA: Diagnosis not present

## 2017-06-10 DIAGNOSIS — Z789 Other specified health status: Secondary | ICD-10-CM | POA: Diagnosis not present

## 2017-06-10 DIAGNOSIS — E039 Hypothyroidism, unspecified: Secondary | ICD-10-CM | POA: Diagnosis not present

## 2017-06-10 DIAGNOSIS — C439 Malignant melanoma of skin, unspecified: Secondary | ICD-10-CM | POA: Insufficient documentation

## 2017-06-10 DIAGNOSIS — IMO0001 Reserved for inherently not codable concepts without codable children: Secondary | ICD-10-CM

## 2017-06-10 DIAGNOSIS — L920 Granuloma annulare: Secondary | ICD-10-CM | POA: Insufficient documentation

## 2017-06-10 LAB — CBC WITH DIFFERENTIAL/PLATELET
BASOS PCT: 0.3 % (ref 0.0–3.0)
Basophils Absolute: 0 10*3/uL (ref 0.0–0.1)
EOS ABS: 0 10*3/uL (ref 0.0–0.7)
EOS PCT: 0.4 % (ref 0.0–5.0)
HEMATOCRIT: 37.3 % (ref 36.0–46.0)
HEMOGLOBIN: 12.8 g/dL (ref 12.0–15.0)
LYMPHS PCT: 29.9 % (ref 12.0–46.0)
Lymphs Abs: 1.9 10*3/uL (ref 0.7–4.0)
MCHC: 34.3 g/dL (ref 30.0–36.0)
MCV: 92.8 fl (ref 78.0–100.0)
MONOS PCT: 9.1 % (ref 3.0–12.0)
Monocytes Absolute: 0.6 10*3/uL (ref 0.1–1.0)
NEUTROS ABS: 3.8 10*3/uL (ref 1.4–7.7)
Neutrophils Relative %: 60.3 % (ref 43.0–77.0)
PLATELETS: 217 10*3/uL (ref 150.0–400.0)
RBC: 4.02 Mil/uL (ref 3.87–5.11)
RDW: 12.3 % (ref 11.5–15.5)
WBC: 6.2 10*3/uL (ref 4.0–10.5)

## 2017-06-10 LAB — COMPREHENSIVE METABOLIC PANEL
ALBUMIN: 4.6 g/dL (ref 3.5–5.2)
ALT: 11 U/L (ref 0–35)
AST: 18 U/L (ref 0–37)
Alkaline Phosphatase: 45 U/L (ref 39–117)
BUN: 12 mg/dL (ref 6–23)
CALCIUM: 9.4 mg/dL (ref 8.4–10.5)
CO2: 28 meq/L (ref 19–32)
Chloride: 101 mEq/L (ref 96–112)
Creatinine, Ser: 0.73 mg/dL (ref 0.40–1.20)
GFR: 88.48 mL/min (ref >60.00)
Glucose, Bld: 87 mg/dL (ref 70–99)
POTASSIUM: 4.3 meq/L (ref 3.5–5.1)
Sodium: 136 mEq/L (ref 135–145)
Total Bilirubin: 0.7 mg/dL (ref 0.2–1.2)
Total Protein: 7.3 g/dL (ref 6.0–8.3)

## 2017-06-10 LAB — LIPID PANEL
CHOL/HDL RATIO: 3
Cholesterol: 177 mg/dL (ref 0–200)
HDL: 62.3 mg/dL (ref >39.00)
LDL CALC: 103 mg/dL — AB (ref 0–99)
NONHDL: 114.69
TRIGLYCERIDES: 57 mg/dL (ref 0.0–149.0)
VLDL: 11.4 mg/dL (ref 0.0–40.0)

## 2017-06-10 NOTE — Telephone Encounter (Signed)
Patient is scheduled for today with Dr. Orma Flaming at 1:30pm.

## 2017-06-10 NOTE — Progress Notes (Signed)
Patient: Ashley Murphy MRN: [id number] DOB: [birth year] PCP: Orma Flaming, MD     Subjective:  Chief Complaint  Patient presents with  . New Patient (Initial Visit)    transfer care    HPI: The patient is a 52 y.o. female who presents today for annual physical, hypothyroid, RLS, OCP. She is doing well and feels well. No recent hospitalizations over the past year. She is up to date on her immunizations/pap smear and colonoscopy. She has had no change in her medical history or allergies.   Hypothyroid: she is asymptomatic. Doing well and takes medication on an empty stomach 30 minutes before breakfast with no medication. Needing refills today.   RLS: She is currently on requip at .5mg . She takes this nightly and states it seems to control her symptoms fairly well. She has some nights where it doesn't seem like her symptoms are controlled.   Preventative health Pap smear:05/21/2016 with negative hpv Mammogram: due for this.  Tdap: 08/09/2014 Colonoscopy: 01/28/2016  Review of Systems  Constitutional: Negative for chills, fatigue and fever.  HENT: Negative for dental problem, ear pain, hearing loss and trouble swallowing.   Eyes: Negative for visual disturbance.  Respiratory: Negative for cough, chest tightness and shortness of breath.   Cardiovascular: Negative for chest pain, palpitations and leg swelling.  Gastrointestinal: Negative for abdominal pain, blood in stool, diarrhea and nausea.  Endocrine: Negative for cold intolerance, polydipsia, polyphagia and polyuria.  Genitourinary: Negative for dysuria and hematuria.  Musculoskeletal: Negative for arthralgias.  Skin: Positive for rash.  Neurological: Negative for dizziness and headaches.  Psychiatric/Behavioral: Negative for dysphoric mood and sleep disturbance. The patient is not nervous/anxious.     Allergies Patient has No Known Allergies.  Past Medical History Patient  has a past medical history of Asthma and  Thyroid disease.  Surgical History Patient  has a past surgical history that includes Ankle arthroscopy with reconstruction (Right) and Cesarean section.  Family History Pateint's family history includes Alzheimer's disease in her paternal grandmother; Arthritis in her father and mother; Cancer in her mother and paternal grandfather; Colon cancer in her paternal grandfather; Heart disease in her father; Hyperlipidemia in her father; Hypertension in her father.  Social History Patient  reports that she has quit smoking. She has never used smokeless tobacco. She reports that she drinks alcohol. She reports that she does not use drugs.    Objective:  Vitals:   06/10/17 1324  BP: 120/78  Pulse: 74  SpO2: 99%  Weight: 122 lb 3.2 oz (55.4 kg)  Height: 5' 1.5" (1.562 m)    Body mass index is 22.72 kg/m.  Physical Exam  Constitutional: She is oriented to person, place, and time. She appears well-developed and well-nourished.  HENT:  Right Ear: External ear normal.  Left Ear: External ear normal.  Mouth/Throat: Oropharynx is clear and moist.  Eyes: Pupils are equal, round, and reactive to light. Conjunctivae and EOM are normal.  Neck: Normal range of motion. Neck supple. No thyromegaly present.  Cardiovascular: Normal rate, regular rhythm, normal heart sounds and intact distal pulses.  No murmur heard. Negative carotid bruits bilaterally   Pulmonary/Chest: Effort normal and breath sounds normal.  Abdominal: Soft. Bowel sounds are normal. She exhibits no distension. There is no tenderness.  Lymphadenopathy:    She has no cervical adenopathy.  Neurological: She is alert and oriented to person, place, and time. She displays normal reflexes. No cranial nerve deficit. Coordination normal.  Skin: Skin is warm and dry. No  rash noted.  Psychiatric: She has a normal mood and affect. Her behavior is normal.  Vitals reviewed.   phq 2: 0  Assessment/plan: 1. Annual physical exam She is  up to date on her tdap/cscope and pap smear. Hep c screen done and negative. Overdue for mmg and discussed with her that since on ocp like her to get this yearly. Information given on setting up screening mmg. Routine screening lab work today. Discussed healthy diet, lifestyle, exercise. Sun screen in the summer/hydration. Safety prevention and dental care. F/u in one year.  - Lipid panel; Future - CBC w/Diff; Future - Comprehensive metabolic panel; Future  2. Primary hypothyroidism Routine lab work today. Will refill medication x 1 year once I get her labs back.  - TSH; Future - T4, free; Future  3. Restless legs syndrome (RLS) Checking ferritin. We are going to increase her to .75mg  and discussed she can titrate up to 1mg  if needed, but I don't want her going past this. Will send in refill of .5mg  with rest of medication.  - Ferritin; Future  4. Birth control Refills given. She discussed possibly coming off of this, but since she is still fertile she would rather stay on this than use alternative contraception means. still having regular periods with no peri-menopausal symptoms. Will get her mmg. F/u in one year or as needed.    Orma Flaming, MD

## 2017-06-11 LAB — FERRITIN: Ferritin: 102.7 ng/mL (ref 10.0–291.0)

## 2017-06-11 LAB — T4, FREE: Free T4: 0.88 ng/dL (ref 0.60–1.60)

## 2017-06-13 ENCOUNTER — Other Ambulatory Visit: Payer: Self-pay | Admitting: Family Medicine

## 2017-06-13 DIAGNOSIS — N951 Menopausal and female climacteric states: Secondary | ICD-10-CM

## 2017-06-13 DIAGNOSIS — G2581 Restless legs syndrome: Secondary | ICD-10-CM

## 2017-06-13 DIAGNOSIS — E039 Hypothyroidism, unspecified: Secondary | ICD-10-CM

## 2017-06-13 DIAGNOSIS — Z3041 Encounter for surveillance of contraceptive pills: Secondary | ICD-10-CM

## 2017-06-13 LAB — HM MAMMOGRAPHY

## 2017-06-13 LAB — TSH: TSH: 2.66 u[IU]/mL (ref 0.35–4.50)

## 2017-06-13 MED ORDER — LEVOTHYROXINE SODIUM 88 MCG PO TABS: 88.0000 ug | ORAL_TABLET | Freq: Every day | ORAL | 3 refills | Status: DC

## 2017-06-13 MED ORDER — ROPINIROLE HCL 0.5 MG PO TABS: 0.5000 mg | ORAL_TABLET | Freq: Every day | ORAL | 3 refills | Status: DC

## 2017-06-13 MED ORDER — NORETHINDRONE 0.35 MG PO TABS: 1.0000 | ORAL_TABLET | Freq: Every day | ORAL | 1 refills | Status: DC

## 2017-06-13 NOTE — Progress Notes (Signed)
Med refills

## 2017-06-29 ENCOUNTER — Encounter: Payer: Self-pay | Admitting: Emergency Medicine

## 2017-09-02 ENCOUNTER — Other Ambulatory Visit: Payer: Self-pay | Admitting: Nurse Practitioner

## 2017-09-02 DIAGNOSIS — N951 Menopausal and female climacteric states: Secondary | ICD-10-CM

## 2017-11-12 ENCOUNTER — Other Ambulatory Visit: Payer: Self-pay | Admitting: Family Medicine

## 2017-11-12 DIAGNOSIS — Z3041 Encounter for surveillance of contraceptive pills: Secondary | ICD-10-CM

## 2017-11-12 DIAGNOSIS — N951 Menopausal and female climacteric states: Secondary | ICD-10-CM

## 2018-05-18 ENCOUNTER — Other Ambulatory Visit: Payer: Self-pay | Admitting: Family Medicine

## 2018-05-18 DIAGNOSIS — E039 Hypothyroidism, unspecified: Secondary | ICD-10-CM

## 2018-07-08 ENCOUNTER — Encounter: Payer: Self-pay | Admitting: Family Medicine

## 2018-07-10 ENCOUNTER — Other Ambulatory Visit: Payer: Self-pay

## 2018-07-10 DIAGNOSIS — E039 Hypothyroidism, unspecified: Secondary | ICD-10-CM

## 2018-07-10 DIAGNOSIS — Z3041 Encounter for surveillance of contraceptive pills: Secondary | ICD-10-CM

## 2018-07-10 DIAGNOSIS — N951 Menopausal and female climacteric states: Secondary | ICD-10-CM

## 2018-07-10 MED ORDER — NORETHINDRONE 0.35 MG PO TABS: 1.0000 | ORAL_TABLET | Freq: Every day | ORAL | 3 refills | Status: DC

## 2018-07-10 MED ORDER — LEVOTHYROXINE SODIUM 88 MCG PO TABS: 88.0000 ug | ORAL_TABLET | Freq: Every day | ORAL | 0 refills | Status: DC

## 2018-07-10 NOTE — Progress Notes (Signed)
Rx refill for Ashley Murphy (30 day supply) sent to [facility] at [address] and 3 mo supply w/3 refills sent to [facility] per patient's request.  Refill also sent in for Levothyroxine to [facility]

## 2018-07-10 NOTE — Progress Notes (Signed)
Rx refill for levothyroxine sent to [facility], refill for Camilla OCP's sent to [facility] at [address].

## 2018-07-12 ENCOUNTER — Other Ambulatory Visit: Payer: Self-pay | Admitting: Family Medicine

## 2018-07-12 DIAGNOSIS — E039 Hypothyroidism, unspecified: Secondary | ICD-10-CM

## 2018-07-12 MED ORDER — LEVOTHYROXINE SODIUM 88 MCG PO TABS: 88.0000 ug | ORAL_TABLET | Freq: Every day | ORAL | 2 refills | Status: DC

## 2018-08-09 DIAGNOSIS — Z1231 Encounter for screening mammogram for malignant neoplasm of breast: Secondary | ICD-10-CM | POA: Diagnosis not present

## 2018-08-09 LAB — HM MAMMOGRAPHY

## 2018-08-17 ENCOUNTER — Encounter: Payer: Self-pay | Admitting: Family Medicine

## 2018-09-01 LAB — LIPID PANEL
Cholesterol: 209 — AB (ref 0–200)
HDL: 65 (ref 35–70)

## 2018-09-01 LAB — BASIC METABOLIC PANEL: Glucose: 95

## 2018-09-07 ENCOUNTER — Encounter: Payer: Self-pay | Admitting: Family Medicine

## 2018-09-20 ENCOUNTER — Other Ambulatory Visit: Payer: Self-pay | Admitting: Family Medicine

## 2018-09-20 DIAGNOSIS — E039 Hypothyroidism, unspecified: Secondary | ICD-10-CM

## 2018-09-20 NOTE — Telephone Encounter (Signed)
Left voicemail message on patient's cell phone requesting a call back.

## 2018-09-20 NOTE — Telephone Encounter (Signed)
Please let her know she is overdue for appointment and lab work...  Ashley Flaming, MD [place]

## 2018-12-06 DIAGNOSIS — Z713 Dietary counseling and surveillance: Secondary | ICD-10-CM | POA: Diagnosis not present

## 2019-05-20 ENCOUNTER — Other Ambulatory Visit: Payer: Self-pay | Admitting: Family Medicine

## 2019-05-20 DIAGNOSIS — Z3041 Encounter for surveillance of contraceptive pills: Secondary | ICD-10-CM

## 2019-05-20 DIAGNOSIS — N951 Menopausal and female climacteric states: Secondary | ICD-10-CM

## 2019-06-14 ENCOUNTER — Other Ambulatory Visit: Payer: Self-pay | Admitting: Family Medicine

## 2019-06-14 ENCOUNTER — Other Ambulatory Visit: Payer: Self-pay

## 2019-06-14 ENCOUNTER — Ambulatory Visit (INDEPENDENT_AMBULATORY_CARE_PROVIDER_SITE_OTHER): Payer: BC Managed Care – PPO | Admitting: Family Medicine

## 2019-06-14 ENCOUNTER — Encounter: Payer: Self-pay | Admitting: Family Medicine

## 2019-06-14 VITALS — BP 118/68 | HR 73 | Temp 98.2°F | Ht 61.5 in | Wt 125.0 lb

## 2019-06-14 DIAGNOSIS — Z0000 Encounter for general adult medical examination without abnormal findings: Secondary | ICD-10-CM | POA: Diagnosis not present

## 2019-06-14 DIAGNOSIS — N951 Menopausal and female climacteric states: Secondary | ICD-10-CM | POA: Diagnosis not present

## 2019-06-14 DIAGNOSIS — E039 Hypothyroidism, unspecified: Secondary | ICD-10-CM | POA: Diagnosis not present

## 2019-06-14 LAB — CBC WITH DIFFERENTIAL/PLATELET
Basophils Absolute: 0 10*3/uL (ref 0.0–0.1)
Basophils Relative: 0.6 % (ref 0.0–3.0)
Eosinophils Absolute: 0 10*3/uL (ref 0.0–0.7)
Eosinophils Relative: 0.2 % (ref 0.0–5.0)
HCT: 40.3 % (ref 36.0–46.0)
Hemoglobin: 13.7 g/dL (ref 12.0–15.0)
Lymphocytes Relative: 17.6 % (ref 12.0–46.0)
Lymphs Abs: 1 10*3/uL (ref 0.7–4.0)
MCHC: 33.9 g/dL (ref 30.0–36.0)
MCV: 94.4 fl (ref 78.0–100.0)
Monocytes Absolute: 0.7 10*3/uL (ref 0.1–1.0)
Monocytes Relative: 11.8 % (ref 3.0–12.0)
Neutro Abs: 3.9 10*3/uL (ref 1.4–7.7)
Neutrophils Relative %: 69.8 % (ref 43.0–77.0)
Platelets: 179 10*3/uL (ref 150.0–400.0)
RBC: 4.27 Mil/uL (ref 3.87–5.11)
RDW: 12.1 % (ref 11.5–15.5)
WBC: 5.6 10*3/uL (ref 4.0–10.5)

## 2019-06-14 LAB — COMPREHENSIVE METABOLIC PANEL
ALT: 10 U/L (ref 0–35)
AST: 16 U/L (ref 0–37)
Albumin: 4.7 g/dL (ref 3.5–5.2)
Alkaline Phosphatase: 50 U/L (ref 39–117)
BUN: 11 mg/dL (ref 6–23)
CO2: 26 mEq/L (ref 19–32)
Calcium: 9.2 mg/dL (ref 8.4–10.5)
Chloride: 103 mEq/L (ref 96–112)
Creatinine, Ser: 0.77 mg/dL (ref 0.40–1.20)
GFR: 77.7 mL/min (ref >60.00)
Glucose, Bld: 81 mg/dL (ref 70–99)
Potassium: 4.3 mEq/L (ref 3.5–5.1)
Sodium: 136 mEq/L (ref 135–145)
Total Bilirubin: 0.6 mg/dL (ref 0.2–1.2)
Total Protein: 7.4 g/dL (ref 6.0–8.3)

## 2019-06-14 LAB — LIPID PANEL
Cholesterol: 174 mg/dL (ref 0–200)
HDL: 54.9 mg/dL (ref >39.00)
LDL Cholesterol: 105 mg/dL — ABNORMAL HIGH (ref 0–99)
NonHDL: 118.94
Total CHOL/HDL Ratio: 3
Triglycerides: 69 mg/dL (ref 0.0–149.0)
VLDL: 13.8 mg/dL (ref 0.0–40.0)

## 2019-06-14 LAB — VITAMIN D 25 HYDROXY (VIT D DEFICIENCY, FRACTURES): VITD: 74.79 ng/mL (ref 30.00–100.00)

## 2019-06-14 LAB — TSH: TSH: 1.95 u[IU]/mL (ref 0.35–4.50)

## 2019-06-14 LAB — T4, FREE: Free T4: 1.07 ng/dL (ref 0.60–1.60)

## 2019-06-14 MED ORDER — NORETHINDRONE 0.35 MG PO TABS: 1.0000 | ORAL_TABLET | Freq: Every day | ORAL | 3 refills | Status: DC

## 2019-06-14 MED ORDER — LEVOTHYROXINE SODIUM 88 MCG PO TABS: ORAL_TABLET | ORAL | 3 refills | Status: DC

## 2019-06-14 NOTE — Progress Notes (Signed)
Patient: Ashley Murphy MRN: [id number] DOB: [birth year] PCP: Orma Flaming, MD     Subjective:  Chief Complaint  Patient presents with  . Annual Exam  . Menstrual Problem    irregular cycles    HPI: The patient is a 54 y.o. female who presents today for annual exam. She denies any changes to past medical history. There have been no recent hospitalizations. They are following a well balanced diet and exercise plan. She follows weight watchers, and has healthy eating habits. Patient reports riding rides her bike 12 min aggressively, and also does yoga. Weight has been stable. She complains of irregular cycles.    Hypothyroidism Currently on synthroid 20mcg. She denies any symptoms of hypo or hyper thyroid. Does need a refill.   Irregular cycles Started over the last year. She states it's every 3 months. She states it is painful, will get period and its very light then it stops and a week later she will have a full blown period for a week then won't get another one again for 3 months. She doesn't feel like it's stretching out longer than 3 months. She is currently on birth control that she has been on for a very long time. She states she bleeds even when not on sugar pills. She Is only on progesterone only pills.    Immunization History  Administered Date(s) Administered  . Influenza,inj,Quad PF,6+ Mos 11/20/2015  . PFIZER SARS-COV-2 Vaccination 05/18/2019, 06/12/2019   Colonoscopy: 01/2016 Mammogram: 08/09/2018 Pap smear: 2018. Normal with negative hpv   Review of Systems  Constitutional: Negative for chills, fatigue and fever.  HENT: Negative for dental problem, ear pain, hearing loss and trouble swallowing.   Eyes: Negative for visual disturbance.  Respiratory: Negative for cough, chest tightness and shortness of breath.   Cardiovascular: Negative for chest pain, palpitations and leg swelling.  Gastrointestinal: Negative for abdominal pain, blood in stool, diarrhea,  nausea and vomiting.  Endocrine: Negative for cold intolerance, polydipsia, polyphagia and polyuria.  Genitourinary: Positive for menstrual problem. Negative for dysuria, frequency, hematuria, pelvic pain and urgency.  Musculoskeletal: Negative for arthralgias.  Skin: Negative for rash.  Neurological: Negative for dizziness, light-headedness and headaches.  Psychiatric/Behavioral: Negative for dysphoric mood and sleep disturbance. The patient is not nervous/anxious.     Allergies Patient has No Known Allergies.  Past Medical History Patient  has a past medical history of Asthma and Thyroid disease.  Surgical History Patient  has a past surgical history that includes Ankle arthroscopy with reconstruction (Right) and Cesarean section.  Family History Pateint's family history includes Alzheimer's disease in her paternal grandmother; Arthritis in her father and mother; Cancer in her mother and paternal grandfather; Colon cancer in her paternal grandfather; Heart disease in her father; Hyperlipidemia in her father; Hypertension in her father.  Social History Patient  reports that she has quit smoking. She has never used smokeless tobacco. She reports current alcohol use. She reports that she does not use drugs.    Objective: Vitals:   06/14/19 1044  BP: 118/68  Pulse: 73  Temp: 98.2 F (36.8 C)  TempSrc: Temporal  SpO2: 98%  Weight: 125 lb (56.7 kg)  Height: 5' 1.5" (1.562 m)    Body mass index is 23.24 kg/m.  Physical Exam Vitals reviewed.  Constitutional:      Appearance: Normal appearance. She is well-developed and normal weight.  HENT:     Head: Normocephalic and atraumatic.     Right Ear: Tympanic membrane, ear canal and external  ear normal.     Left Ear: Tympanic membrane, ear canal and external ear normal.  Eyes:     Extraocular Movements: Extraocular movements intact.     Conjunctiva/sclera: Conjunctivae normal.     Pupils: Pupils are equal, round, and reactive  to light.  Neck:     Thyroid: No thyromegaly.     Vascular: No carotid bruit.     Comments: No thyromegaly  Cardiovascular:     Rate and Rhythm: Normal rate and regular rhythm.     Pulses: Normal pulses.     Heart sounds: Normal heart sounds. No murmur.  Pulmonary:     Effort: Pulmonary effort is normal.     Breath sounds: Normal breath sounds.  Abdominal:     General: Abdomen is flat. Bowel sounds are normal. There is no distension.     Palpations: Abdomen is soft.     Tenderness: There is no abdominal tenderness.  Musculoskeletal:     Cervical back: Normal range of motion and neck supple.  Lymphadenopathy:     Cervical: No cervical adenopathy.  Skin:    General: Skin is warm and dry.     Findings: No rash.  Neurological:     General: No focal deficit present.     Mental Status: She is alert and oriented to person, place, and time.     Cranial Nerves: No cranial nerve deficit.     Coordination: Coordination normal.     Deep Tendon Reflexes: Reflexes normal.  Psychiatric:        Mood and Affect: Mood normal.        Behavior: Behavior normal.      Office Visit from 06/14/2019 in [facility]  PHQ-2 Total Score  0         Assessment/plan: 1. Annual physical exam Routine fasting lab work today. She is fasting. She is UTD on her HM. Doing great and has lost weight during covid. Doing great.  Patient counseling [x]    Nutrition: Stressed importance of moderation in sodium/caffeine intake, saturated fat and cholesterol, caloric balance, sufficient intake of fresh fruits, vegetables, fiber, calcium, iron, and 1 mg of folate supplement per day (for females capable of pregnancy).  [x]    Stressed the importance of regular exercise.   []    Substance Abuse: Discussed cessation/primary prevention of tobacco, alcohol, or other drug use; driving or other dangerous activities under the influence; availability of treatment for abuse.   [x]    Injury prevention:  Discussed safety belts, safety helmets, smoke detector, smoking near bedding or upholstery.   [x]    Sexuality: Discussed sexually transmitted diseases, partner selection, use of condoms, avoidance of unintended pregnancy  and contraceptive alternatives.  [x]    Dental health: Discussed importance of regular tooth brushing, flossing, and dental visits.  [x]    Health maintenance and immunizations reviewed. Please refer to Health maintenance section.    - CBC with Differential/Platelet - Comprehensive metabolic panel - Lipid panel - T4, free - TSH - VITAMIN D 25 Hydroxy (Vit-D Deficiency, Fractures)  2. Perimenopause She is 53 years. Will check labs, but she is on POP. Desires birth control.  - FSH/LH  3. Primary hypothyroidism Euthyroid. Refilled medication. Checking labs today.  - levothyroxine (SYNTHROID) 88 MCG tablet; TAKE 1 TABLET DAILY BEFORE BREAKFAST  Dispense: 90 tablet; Refill: 3    This visit occurred during the SARS-CoV-2 public health emergency.  Safety protocols were in place, including screening questions prior to the visit, additional usage of staff PPE,  and extensive cleaning of exam room while observing appropriate contact time as indicated for disinfecting solutions.     Return in about 1 year (around 06/13/2020) for annual/labs .     Orma Flaming, MD [facility]  06/14/2019

## 2019-06-14 NOTE — Patient Instructions (Signed)

## 2019-06-15 LAB — FSH/LH
FSH: 10 m[IU]/mL
LH: 2.3 m[IU]/mL

## 2019-06-18 ENCOUNTER — Other Ambulatory Visit: Payer: Self-pay | Admitting: Family Medicine

## 2019-06-18 DIAGNOSIS — N951 Menopausal and female climacteric states: Secondary | ICD-10-CM

## 2019-06-19 ENCOUNTER — Encounter: Payer: Self-pay | Admitting: Obstetrics and Gynecology

## 2019-06-20 ENCOUNTER — Encounter: Payer: 59 | Admitting: Family Medicine

## 2019-07-20 ENCOUNTER — Ambulatory Visit: Payer: BC Managed Care – PPO | Admitting: Obstetrics and Gynecology

## 2019-08-16 DIAGNOSIS — Z1231 Encounter for screening mammogram for malignant neoplasm of breast: Secondary | ICD-10-CM | POA: Diagnosis not present

## 2019-08-16 LAB — HM MAMMOGRAPHY

## 2019-08-17 ENCOUNTER — Other Ambulatory Visit (HOSPITAL_COMMUNITY)
Admission: RE | Admit: 2019-08-17 | Discharge: 2019-08-17 | Disposition: A | Discharge status: Home / Self Care | Payer: BC Managed Care – PPO | Source: Ambulatory Visit | Attending: Obstetrics and Gynecology | Admitting: Obstetrics and Gynecology

## 2019-08-17 ENCOUNTER — Encounter: Payer: Self-pay | Admitting: Obstetrics and Gynecology

## 2019-08-17 ENCOUNTER — Other Ambulatory Visit: Payer: Self-pay

## 2019-08-17 ENCOUNTER — Ambulatory Visit (INDEPENDENT_AMBULATORY_CARE_PROVIDER_SITE_OTHER): Payer: BC Managed Care – PPO | Admitting: Obstetrics and Gynecology

## 2019-08-17 VITALS — BP 122/70 | HR 74 | Ht 61.75 in | Wt 122.2 lb

## 2019-08-17 DIAGNOSIS — N946 Dysmenorrhea, unspecified: Secondary | ICD-10-CM

## 2019-08-17 DIAGNOSIS — N939 Abnormal uterine and vaginal bleeding, unspecified: Secondary | ICD-10-CM

## 2019-08-17 DIAGNOSIS — N951 Menopausal and female climacteric states: Secondary | ICD-10-CM

## 2019-08-17 DIAGNOSIS — Z3009 Encounter for other general counseling and advice on contraception: Secondary | ICD-10-CM | POA: Diagnosis not present

## 2019-08-17 DIAGNOSIS — Z124 Encounter for screening for malignant neoplasm of cervix: Secondary | ICD-10-CM | POA: Diagnosis not present

## 2019-08-17 MED ORDER — NAPROXEN SODIUM 550 MG PO TABS
550.0000 mg | ORAL_TABLET | Freq: Two times a day (BID) | ORAL | 1 refills | Status: DC
Start: 2019-08-17 — End: 2021-09-08

## 2019-08-17 NOTE — Patient Instructions (Signed)

## 2019-08-17 NOTE — Progress Notes (Signed)
54 y.o. G2P0202 Married White or Caucasian Not Hispanic or Latino female here for a consultation from Dr Rogers Blocker for perimenopausal symptoms. She reports irregular periods, last period before this one was Late April. Her cramping is more severe now than it used to be. She cramps for several days before her period.  She has been on POP for 5-6 years.  She previously was on the mirena and did well on it.  Cycles are sporadic, approximately every 3-4 months. She has bad cramping for days prior to her cycle, then spots for a few days, stops for a few days, then flow comes back for 1-2 weeks. Saturates a regular tampon in 6 hours. No hot flashes, night sweats or vaginal dryness. Sexually active, no pain. The cramps now are worse than they have ever been. Cramps can wake her up. The cramps can last for 4 days, goes away with her cycle. No real help with 400 mg of advil. Cramps have been this bad for a year. Prior to the last year she didn't bleed for more than a week at a time, now she bleeds typically for 2 weeks at a time.   Recent FSH was 10, TSH 1.95.    Period Pattern: (!) Irregular Menstrual Flow: Light Menstrual Control: Thin pad Menstrual Control Change Freq (Hours): 4 Dysmenorrhea: (!) Severe Dysmenorrhea Symptoms: Cramping  Patient's last menstrual period was 08/16/2019 (exact date).          Sexually active: Yes.    The current method of family planning is POP (progesterone).    Exercising: Yes.    bike  Smoker:  no  Health Maintenance: Pap:  05/21/2016 Neg HPV Neg  History of abnormal Pap:  no MMG:  08/16/19 report requested  BMD:   Never  Colonoscopy: 01/28/16 normal  TDaP:  2016  Gardasil: Na   reports that she has quit smoking. She has never used smokeless tobacco. She reports current alcohol use. She reports that she does not use drugs. Couple of glasses of wine a week. Works as a Government social research officer. Kids are 30 and 25. Has a grandson 28 months old.   Past Medical History:   Diagnosis Date  . Anemia   . Anxiety   . Asthma   . Thyroid disease    hypothyroid  . Urinary incontinence    not severe  Just occasional GSI, tolerable.   Past Surgical History:  Procedure Laterality Date  . ANKLE ARTHROSCOPY WITH RECONSTRUCTION Right   . CESAREAN SECTION    C/S x 1, then VBAC  Current Outpatient Medications  Medication Sig Dispense Refill  . levothyroxine (SYNTHROID) 88 MCG tablet TAKE 1 TABLET DAILY BEFORE BREAKFAST 90 tablet 3  . Multiple Vitamins-Minerals (MULTIVITAMIN PO) Take by mouth daily.    . norethindrone (CAMILA) 0.35 MG tablet Take 1 tablet (0.35 mg total) by mouth daily. 84 tablet 3   No current facility-administered medications for this visit.    Family History  Problem Relation Age of Onset  . Arthritis Mother   . Cancer Mother   . Arthritis Father   . Heart disease Father   . Hypertension Father   . Hyperlipidemia Father   . Cancer Paternal Grandfather   . Colon cancer Paternal Grandfather   . Alzheimer's disease Paternal Grandmother   . Esophageal cancer Neg Hx   . Rectal cancer Neg Hx   . Stomach cancer Neg Hx    Mom with stage 4 cancer, diagnosed at 97. Primary unknown. Died at 74.  It was thought to be from her reproductive system. Not aware of any genetic testing.   Review of Systems  Genitourinary: Positive for vaginal bleeding.  All other systems reviewed and are negative.   Exam:   BP 122/70   Pulse 74   Ht 5' 1.75" (1.568 m)   Wt 122 lb 3.2 oz (55.4 kg)   LMP 08/16/2019 (Exact Date)   SpO2 100%   BMI 22.53 kg/m   Weight change: @WEIGHTCHANGE @ Height:   Height: 5' 1.75" (156.8 cm)  Ht Readings from Last 3 Encounters:  08/17/19 5' 1.75" (1.568 m)  06/14/19 5' 1.5" (1.562 m)  06/10/17 5' 1.5" (1.562 m)    General appearance: alert, cooperative and appears stated age Head: Normocephalic, without obvious abnormality, atraumatic Neck: no adenopathy, supple, symmetrical, trachea midline and thyroid normal to  inspection and palpation Lungs: clear to auscultation bilaterally Cardiovascular: regular rate and rhythm Breasts: normal appearance, no masses or tenderness Abdomen: soft, non-tender; non distended,  no masses,  no organomegaly Extremities: extremities normal, atraumatic, no cyanosis or edema Skin: Skin color, texture, turgor normal. No rashes or lesions Lymph nodes: Cervical, supraclavicular, and axillary nodes normal. No abnormal inguinal nodes palpated Neurologic: Grossly normal   Pelvic: External genitalia:  no lesions              Urethra:  normal appearing urethra with no masses, tenderness or lesions              Bartholins and Skenes: normal                 Vagina: normal appearing vagina with normal color and discharge, no lesions              Cervix: no lesions               Bimanual Exam:  Uterus:  normal size, contour, position, consistency, mobility, non-tender and anteverted              Adnexa: no mass, fullness, tenderness               Rectovaginal: Confirms               Anus:  normal sphincter tone, no lesions  Gae Dry chaperoned for the exam.  A:  Perimenopausal  New onset severe cramps prior to her cycle  Contraception, on micronor  Some episodes of prolonged bleeding over the last year  Family history of likely reproductive cancer (mother)  P:   Anaprox for cramps  Recommend pelvic ultrasound  Discussed the mirena IUD for contraception and to help with the menopausal transition, she has done well on it in the past.   She will return for ultrasound and mirena IUD insertion. Information on the mirena given and reviewwed  Pap with hpv  CC: Dr Rogers Blocker Note sent

## 2019-08-20 LAB — CYTOLOGY - PAP
Comment: NEGATIVE
Diagnosis: NEGATIVE
High risk HPV: NEGATIVE

## 2019-08-22 ENCOUNTER — Telehealth: Payer: Self-pay | Admitting: Obstetrics and Gynecology

## 2019-08-22 NOTE — Telephone Encounter (Signed)
Call placed to convey benefits. Spoke with the patient and conveyed the benefits. Patient understands/agreeable with the benefits. Patient is aware of the cancellation policy. Appointment scheduled 09/04/19@3pm  with Dr.Jertson.

## 2019-08-23 ENCOUNTER — Encounter: Payer: Self-pay | Admitting: Family Medicine

## 2019-09-04 ENCOUNTER — Other Ambulatory Visit: Payer: Self-pay

## 2019-09-04 ENCOUNTER — Other Ambulatory Visit: Payer: Self-pay | Admitting: Obstetrics and Gynecology

## 2019-09-04 ENCOUNTER — Ambulatory Visit (INDEPENDENT_AMBULATORY_CARE_PROVIDER_SITE_OTHER): Payer: BC Managed Care – PPO

## 2019-09-04 ENCOUNTER — Encounter: Payer: Self-pay | Admitting: Obstetrics and Gynecology

## 2019-09-04 ENCOUNTER — Ambulatory Visit (INDEPENDENT_AMBULATORY_CARE_PROVIDER_SITE_OTHER): Payer: BC Managed Care – PPO | Admitting: Obstetrics and Gynecology

## 2019-09-04 VITALS — BP 145/78 | HR 65 | Ht 61.0 in | Wt 122.0 lb

## 2019-09-04 DIAGNOSIS — N946 Dysmenorrhea, unspecified: Secondary | ICD-10-CM

## 2019-09-04 DIAGNOSIS — N939 Abnormal uterine and vaginal bleeding, unspecified: Secondary | ICD-10-CM

## 2019-09-04 DIAGNOSIS — Z30430 Encounter for insertion of intrauterine contraceptive device: Secondary | ICD-10-CM

## 2019-09-04 DIAGNOSIS — N951 Menopausal and female climacteric states: Secondary | ICD-10-CM

## 2019-09-04 DIAGNOSIS — N882 Stricture and stenosis of cervix uteri: Secondary | ICD-10-CM | POA: Diagnosis not present

## 2019-09-04 DIAGNOSIS — Z3009 Encounter for other general counseling and advice on contraception: Secondary | ICD-10-CM

## 2019-09-04 NOTE — Progress Notes (Signed)
GYNECOLOGY  VISIT   HPI: 54 y.o.   Married White or Caucasian Not Hispanic or Latino  female   [phone] with Patient's last menstrual period was 08/16/2019 (exact date).   here for evaluation of worsening, severe dysmenorrhea and mirena IUD insertion. She is on micronor and is having irregular cycles. Still perimenopausal, Recent FSH was 10, TSH was 1.95.  Pap from earlier this month was negative with negative HPV.   GYNECOLOGIC HISTORY: Patient's last menstrual period was 08/16/2019 (exact date). Contraception: micronor Menopausal hormone therapy: none        OB History    Gravida  2   Para  2   Term      Preterm  2   AB      Living  2     SAB      TAB      Ectopic      Multiple      Live Births  2              Patient Active Problem List   Diagnosis Date Noted  . Granuloma annulare 06/10/2017  . Melanoma of skin ([place]) 06/10/2017  . Ankle fracture, right 07/28/2015  . Primary hypothyroidism 07/28/2015  . Restless legs syndrome (RLS) 07/28/2015  . Extrinsic asthma 07/28/2015    Past Medical History:  Diagnosis Date  . Anemia   . Anxiety   . Asthma   . Thyroid disease    hypothyroid  . Urinary incontinence    not severe    Past Surgical History:  Procedure Laterality Date  . ANKLE ARTHROSCOPY WITH RECONSTRUCTION Right   . CESAREAN SECTION      Current Outpatient Medications  Medication Sig Dispense Refill  . levothyroxine (SYNTHROID) 88 MCG tablet TAKE 1 TABLET DAILY BEFORE BREAKFAST 90 tablet 3  . Multiple Vitamins-Minerals (MULTIVITAMIN PO) Take by mouth daily.    . naproxen sodium (ANAPROX DS) 550 MG tablet Take 1 tablet (550 mg total) by mouth 2 (two) times daily with a meal. Use prn cramps 30 tablet 1  . norethindrone (CAMILA) 0.35 MG tablet Take 1 tablet (0.35 mg total) by mouth daily. 84 tablet 3   No current facility-administered medications for this visit.     ALLERGIES: Patient has no known allergies.  Family History  Problem  Relation Age of Onset  . Arthritis Mother   . Cancer Mother 16       likely from reproductive organs   . Ovarian cancer Mother   . Arthritis Father   . Heart disease Father   . Hypertension Father   . Hyperlipidemia Father   . Cancer Paternal Grandfather   . Colon cancer Paternal Grandfather   . Alzheimer's disease Paternal Grandmother   . Esophageal cancer Neg Hx   . Rectal cancer Neg Hx   . Stomach cancer Neg Hx     Social History   Socioeconomic History  . Marital status: Married    Spouse name: Not on file  . Number of children: Not on file  . Years of education: Not on file  . Highest education level: Not on file  Occupational History  . Not on file  Tobacco Use  . Smoking status: Former Research scientist (life sciences)  . Smokeless tobacco: Never Used  . Tobacco comment: Quit 27 years ago  Substance and Sexual Activity  . Alcohol use: Yes    Alcohol/week: 0.0 standard drinks    Comment: socially  . Drug use: No  . Sexual activity: Not  on file  Other Topics Concern  . Not on file  Social History Narrative  . Not on file   Social Determinants of Health   Financial Resource Strain:   . Difficulty of Paying Living Expenses:   Food Insecurity:   . Worried About Charity fundraiser in the Last Year:   . Arboriculturist in the Last Year:   Transportation Needs:   . Film/video editor (Medical):   Marland Kitchen Lack of Transportation (Non-Medical):   Physical Activity:   . Days of Exercise per Week:   . Minutes of Exercise per Session:   Stress:   . Feeling of Stress :   Social Connections:   . Frequency of Communication with Friends and Family:   . Frequency of Social Gatherings with Friends and Family:   . Attends Religious Services:   . Active Member of Clubs or Organizations:   . Attends Archivist Meetings:   Marland Kitchen Marital Status:   Intimate Partner Violence:   . Fear of Current or Ex-Partner:   . Emotionally Abused:   Marland Kitchen Physically Abused:   . Sexually Abused:     Review  of Systems  All other systems reviewed and are negative.   PHYSICAL EXAMINATION:    LMP 08/16/2019 (Exact Date)     General appearance: alert, cooperative and appears stated age  Pelvic: External genitalia:  no lesions              Urethra:  normal appearing urethra with no masses, tenderness or lesions              Bartholins and Skenes: normal                 Vagina: normal appearing vagina with normal color and discharge, no lesions              Cervix: no lesions and stenotic                The risks of the mirena IUD were reviewed with the patient, including infection, abnormal bleeding and uterine perfortion. Consent was signed.  A speculum was placed in the vagina, the cervix was cleansed with betadine. A tenaculum was placed on the cervix, the cervix was dilated with the mini-dilators up to the 5 hagar dilators under ultrasound guidance. The cervix was stenotic and the dilation was difficult. the uterus sounded to 7-8 cm.  The mirena IUD was inserted with ultrasound guidance. The string were cut to 3-4 cm. The tenaculum was removed. Slight oozing from the tenaculum site was stopped with pressure.   Post insertion vaginal ultrasound showed the IUD in place.   Chaperone was present for exam.  Ultrasound images reviewed with the patient, several small myomas. One of the myomas may deviate the cavity slightly.   ASSESSMENT Contraception Severe dysmenorrhea Irregular bleeding on micronor Fibroids, one small myoma may be deviating the cavity slightly.  Cervical stenosis    PLAN We discussed that fibroids could lessen the effectiveness of the mirena or increase the risk of expulsion. I doubt it would have a significant effect on her since her overall cavity isn't enlarged and any deviation is minimal. Mirena IUD inserted under ultrasound guidance, needed dilation starting with the mini-dilators.  Continue with the micronor for one more week

## 2019-09-04 NOTE — Patient Instructions (Addendum)
IUD Post-procedure Instructions . Cramping is common.  You may take Ibuprofen, Aleve, or Tylenol for the cramping.  This should resolve within 24 hours.   . You may have a small amount of spotting.  You should wear a mini pad for the next few days. . You may have intercourse in 24 hours. . You need to call the office if you have any pelvic pain, fever, heavy bleeding, or foul smelling vaginal discharge. . Shower or bathe as normal . Use back up contraception for one week   Uterine Fibroids  Uterine fibroids (leiomyomas) are noncancerous (benign) tumors that can develop in the uterus. Fibroids may also develop in the fallopian tubes, cervix, or tissues (ligaments) near the uterus. You may have one or many fibroids. Fibroids vary in size, weight, and where they grow in the uterus. Some can become quite large. Most fibroids do not require medical treatment. What are the causes? The cause of this condition is not known. What increases the risk? You are more likely to develop this condition if you:  Are in your 30s or 40s and have not gone through menopause.  Have a family history of this condition.  Are of African-American descent.  Had your first period at an early age (early menarche).  Have not had any children (nulliparity).  Are overweight or obese. What are the signs or symptoms? Many women do not have any symptoms. Symptoms of this condition may include:  Heavy menstrual bleeding.  Bleeding or spotting between periods.  Pain and pressure in the pelvic area, between the hips.  Bladder problems, such as needing to urinate urgently or more often than usual.  Inability to have children (infertility).  Failure to carry pregnancy to term (miscarriage). How is this diagnosed? This condition may be diagnosed based on:  Your symptoms and medical history.  A physical exam.  A pelvic exam that includes feeling for any tumors.  Imaging tests, such as ultrasound or MRI. How  is this treated? Treatment for this condition may include:  Seeing your health care provider for follow-up visits to monitor your fibroids for any changes.  Taking NSAIDs such as ibuprofen, naproxen, or aspirin to reduce pain.  Hormone medicines. These may be taken as a pill, given in an injection, or delivered by a T-shaped device that is inserted into the uterus (intrauterine device, IUD).  Surgery to remove one of the following: ? The fibroids (myomectomy). Your health care provider may recommend this if fibroids affect your fertility and you want to become pregnant. ? The uterus (hysterectomy). ? Blood supply to the fibroids (uterine artery embolization). Follow these instructions at home:  Take over-the-counter and prescription medicines only as told by your health care provider.  Ask your health care provider if you should take iron pills or eat more iron-rich foods, such as dark green, leafy vegetables. Heavy menstrual bleeding can cause low iron levels.  If directed, apply heat to your back or abdomen to reduce pain. Use the heat source that your health care provider recommends, such as a moist heat pack or a heating pad. ? Place a towel between your skin and the heat source. ? Leave the heat on for 20-30 minutes. ? Remove the heat if your skin turns bright red. This is especially important if you are unable to feel pain, heat, or cold. You may have a greater risk of getting burned.  Pay close attention to your menstrual cycle. Tell your health care provider about any changes, such  as: ? Increased blood flow that requires you to use more pads or tampons than usual. ? A change in the number of days that your period lasts. ? A change in symptoms that are associated with your period, such as back pain or cramps in your abdomen.  Keep all follow-up visits as told by your health care provider. This is important, especially if your fibroids need to be monitored for any  changes. Contact a health care provider if you:  Have pelvic pain, back pain, or cramps in your abdomen that do not get better with medicine or heat.  Develop new bleeding between periods.  Have increased bleeding during or between periods.  Feel unusually tired or weak.  Feel light-headed. Get help right away if you:  Faint.  Have pelvic pain that suddenly gets worse.  Have severe vaginal bleeding that soaks a tampon or pad in 30 minutes or less. Summary  Uterine fibroids are noncancerous (benign) tumors that can develop in the uterus.  The exact cause of this condition is not known.  Most fibroids do not require medical treatment unless they affect your ability to have children (fertility).  Contact a health care provider if you have pelvic pain, back pain, or cramps in your abdomen that do not get better with medicines.  Make sure you know what symptoms should cause you to get help right away. This information is not intended to replace advice given to you by your health care provider. Make sure you discuss any questions you have with your health care provider. Document Revised: 01/07/2017 Document Reviewed: 12/21/2016 Elsevier Patient Education  2020 [employer].

## 2019-10-05 ENCOUNTER — Ambulatory Visit: Payer: BC Managed Care – PPO | Admitting: Obstetrics and Gynecology

## 2019-10-08 ENCOUNTER — Other Ambulatory Visit: Payer: Self-pay

## 2019-10-08 ENCOUNTER — Encounter: Payer: Self-pay | Admitting: Obstetrics and Gynecology

## 2019-10-08 ENCOUNTER — Ambulatory Visit (INDEPENDENT_AMBULATORY_CARE_PROVIDER_SITE_OTHER): Payer: BC Managed Care – PPO | Admitting: Obstetrics and Gynecology

## 2019-10-08 VITALS — BP 110/64 | HR 92 | Ht 61.5 in | Wt 118.0 lb

## 2019-10-08 DIAGNOSIS — Z30431 Encounter for routine checking of intrauterine contraceptive device: Secondary | ICD-10-CM

## 2019-10-08 NOTE — Progress Notes (Signed)
GYNECOLOGY  VISIT   HPI: 54 y.o.   Married White or Caucasian Not Hispanic or Latino  female   [phone] with No LMP recorded.   here for IUD follow up. Patient states that she has not stopped bleeding since having the IUD was put in . She says that it is light and spotting but it is every day.   She had a mirena IUD inserted last month for contraception and severe dysmenorrhea, not controlled with micronor. She has been bleeding since IUD insertion, the bleeding went from light to just spotting in the last month.  She c/o continued cramping, even with the spotting. The cramping is a 3/10, prior to the IUD her cramping was up to a 9-10/10 in severity. She thinks the cramping is improving. She has been sexually active 1 x since insertion, felt more dry than normal. No deep dyspareunia.   GYNECOLOGIC HISTORY: No LMP recorded. Contraception:IUD Menopausal hormone therapy: none        OB History    Gravida  2   Para  2   Term      Preterm  2   AB      Living  2     SAB      TAB      Ectopic      Multiple      Live Births  2              Patient Active Problem List   Diagnosis Date Noted  . Granuloma annulare 06/10/2017  . Melanoma of skin ([place]) 06/10/2017  . Ankle fracture, right 07/28/2015  . Primary hypothyroidism 07/28/2015  . Restless legs syndrome (RLS) 07/28/2015  . Extrinsic asthma 07/28/2015    Past Medical History:  Diagnosis Date  . Anemia   . Anxiety   . Asthma   . Thyroid disease    hypothyroid  . Urinary incontinence    not severe    Past Surgical History:  Procedure Laterality Date  . ANKLE ARTHROSCOPY WITH RECONSTRUCTION Right   . CESAREAN SECTION      Current Outpatient Medications  Medication Sig Dispense Refill  . amoxicillin (AMOXIL) 500 MG tablet Take 500 mg by mouth 3 (three) times daily.    Marland Kitchen ibuprofen (ADVIL) 800 MG tablet Take 800 mg by mouth every 6 (six) hours as needed.    Marland Kitchen levothyroxine (SYNTHROID) 88 MCG tablet TAKE 1  TABLET DAILY BEFORE BREAKFAST 90 tablet 3  . Multiple Vitamins-Minerals (MULTIVITAMIN PO) Take by mouth daily.    . naproxen sodium (ANAPROX DS) 550 MG tablet Take 1 tablet (550 mg total) by mouth 2 (two) times daily with a meal. Use prn cramps 30 tablet 1   No current facility-administered medications for this visit.     ALLERGIES: Patient has no known allergies.  Family History  Problem Relation Age of Onset  . Arthritis Mother   . Cancer Mother 45       likely from reproductive organs   . Ovarian cancer Mother   . Arthritis Father   . Heart disease Father   . Hypertension Father   . Hyperlipidemia Father   . Cancer Paternal Grandfather   . Colon cancer Paternal Grandfather   . Alzheimer's disease Paternal Grandmother   . Esophageal cancer Neg Hx   . Rectal cancer Neg Hx   . Stomach cancer Neg Hx     Social History   Socioeconomic History  . Marital status: Married    Spouse name:  Not on file  . Number of children: Not on file  . Years of education: Not on file  . Highest education level: Not on file  Occupational History  . Not on file  Tobacco Use  . Smoking status: Former Research scientist (life sciences)  . Smokeless tobacco: Never Used  . Tobacco comment: Quit 27 years ago  Substance and Sexual Activity  . Alcohol use: Yes    Alcohol/week: 0.0 standard drinks    Comment: socially  . Drug use: No  . Sexual activity: Not on file  Other Topics Concern  . Not on file  Social History Narrative  . Not on file   Social Determinants of Health   Financial Resource Strain:   . Difficulty of Paying Living Expenses: Not on file  Food Insecurity:   . Worried About Charity fundraiser in the Last Year: Not on file  . Ran Out of Food in the Last Year: Not on file  Transportation Needs:   . Lack of Transportation (Medical): Not on file  . Lack of Transportation (Non-Medical): Not on file  Physical Activity:   . Days of Exercise per Week: Not on file  . Minutes of Exercise per Session: Not  on file  Stress:   . Feeling of Stress : Not on file  Social Connections:   . Frequency of Communication with Friends and Family: Not on file  . Frequency of Social Gatherings with Friends and Family: Not on file  . Attends Religious Services: Not on file  . Active Member of Clubs or Organizations: Not on file  . Attends Archivist Meetings: Not on file  . Marital Status: Not on file  Intimate Partner Violence:   . Fear of Current or Ex-Partner: Not on file  . Emotionally Abused: Not on file  . Physically Abused: Not on file  . Sexually Abused: Not on file    Review of Systems  All other systems reviewed and are negative.   PHYSICAL EXAMINATION:    There were no vitals taken for this visit.    General appearance: alert, cooperative and appears stated age  Pelvic: External genitalia:  no lesions              Urethra:  normal appearing urethra with no masses, tenderness or lesions              Bartholins and Skenes: normal                 Vagina: normal appearing vagina with normal color and discharge, no lesions              Cervix: no lesions and IUD string 4 cm              Bimanual Exam:  Uterus:  normal size, contour, position, consistency, mobility, non-tender              Adnexa: no mass, fullness, tenderness               Chaperone was present for exam.  ASSESSMENT IUD check, overall doing well Cramping is improving    PLAN Routine f/u Call with any concerns

## 2019-10-18 ENCOUNTER — Ambulatory Visit: Payer: BC Managed Care – PPO | Admitting: Obstetrics and Gynecology

## 2019-12-10 ENCOUNTER — Encounter: Payer: Self-pay | Admitting: Obstetrics and Gynecology

## 2019-12-12 ENCOUNTER — Telehealth: Payer: Self-pay

## 2019-12-12 NOTE — Telephone Encounter (Signed)
Pt sent following mychart message:  [facility] Hello Dr. Talbert Nan, as expected the recent changes in my Birth Control have now allowed the normal symptoms of menopause to begin. For the past month, I have been having hot flashes that have been keeping me up at night. Do you have any recommendations?

## 2019-12-12 NOTE — Telephone Encounter (Signed)
Left message for pt to return call to triage RN. 

## 2019-12-12 NOTE — Telephone Encounter (Signed)
I would recommend a visit to discuss her symptoms and possible treatment options.

## 2019-12-12 NOTE — Telephone Encounter (Signed)
AEX 7/21 Mirena IUD placed 08/2019 Perimenopausal 08/2019  Spoke with pt. Pt states having hot flashes, night sweats, insomnia and vaginal dryness x 1 month. Pt states last cycle was Sept. Missed Oct cycle. Denies any vaginal sx, UTI sx or other concerns. Pt wanting to know if needs to come in for OV for hormone recheck or just monitor.  Pt advised will review with Dr Talbert Nan and return call with recommendations. Pt agreeable.   Routing to Dr Talbert Nan, please advise

## 2019-12-12 NOTE — Telephone Encounter (Signed)
Spoke with pt. Pt given update and recommendations per Dr Talbert Nan. Pt agreeable to have OV. OV scheduled for 11/8 at 1030 am. Pt agreeable to date and time of appt.  Encounter closed

## 2019-12-17 ENCOUNTER — Other Ambulatory Visit: Payer: Self-pay

## 2019-12-17 ENCOUNTER — Encounter: Payer: Self-pay | Admitting: Obstetrics and Gynecology

## 2019-12-17 ENCOUNTER — Ambulatory Visit (INDEPENDENT_AMBULATORY_CARE_PROVIDER_SITE_OTHER): Payer: BC Managed Care – PPO | Admitting: Obstetrics and Gynecology

## 2019-12-17 VITALS — BP 122/68 | HR 78 | Ht 61.5 in | Wt 122.4 lb

## 2019-12-17 DIAGNOSIS — N951 Menopausal and female climacteric states: Secondary | ICD-10-CM | POA: Diagnosis not present

## 2019-12-17 MED ORDER — ESTRADIOL 0.025 MG/24HR TD PTTW
1.0000 | MEDICATED_PATCH | TRANSDERMAL | 1 refills | Status: DC
Start: 2019-12-17 — End: 2020-01-17

## 2019-12-17 NOTE — Patient Instructions (Signed)
Menopause and Hormone Replacement Therapy Menopause is a normal time of life when menstrual periods stop completely and the ovaries stop producing the female hormones estrogen and progesterone. This lack of hormones can affect your health and cause undesirable symptoms. Hormone replacement therapy (HRT) can relieve some of those symptoms. What is hormone replacement therapy? HRT is the use of artificial (synthetic) hormones to replace hormones that your body has stopped producing because you have reached menopause. What are my options for HRT?  HRT may consist of the synthetic hormones estrogen and progestin, or it may consist of only estrogen (estrogen-only therapy). You and your health care provider will decide which form of HRT is best for you. If you choose to be on HRT and you have a uterus, estrogen and progestin are usually prescribed. Estrogen-only therapy is used for women who do not have a uterus. Possible options for taking HRT include:  Pills.  Patches.  Gels.  Sprays.  Vaginal cream.  Vaginal rings.  Vaginal inserts. The amount of hormone(s) that you take and how long you take the hormone(s) varies according to your health. It is important to:  Begin HRT with the lowest possible dosage.  Stop HRT as soon as your health care provider tells you to stop.  Work with your health care provider so that you feel informed and comfortable with your decisions. What are the benefits of HRT? HRT can reduce the frequency and severity of menopausal symptoms. Benefits of HRT vary according to the kind of symptoms that you have, how severe they are, and your overall health. HRT may help to improve the following symptoms of menopause:  Hot flashes and night sweats. These are sudden feelings of heat that spread over the face and body. The skin may turn red, like a blush. Night sweats are hot flashes that happen while you are sleeping or trying to sleep.  Bone loss (osteoporosis). The  body loses calcium more quickly after menopause, causing the bones to become weaker. This can increase the risk for bone breaks (fractures).  Vaginal dryness. The lining of the vagina can become thin and dry, which can cause pain during sex or cause infection, burning, or itching.  Urinary tract infections.  Urinary incontinence. This is the inability to control when you pass urine.  Irritability.  Short-term memory problems. What are the risks of HRT? Risks of HRT vary depending on your individual health and medical history. Risks of HRT also depend on whether you receive both estrogen and progestin or you receive estrogen only. HRT may increase the risk of:  Spotting. This is when a small amount of blood leaks from the vagina unexpectedly.  Endometrial cancer. This cancer is in the lining of the uterus (endometrium).  Breast cancer.  Increased density of breast tissue. This can make it harder to find breast cancer on a breast X-ray (mammogram).  Stroke.  Heart disease.  Blood clots.  Gallbladder disease.  Liver disease. Risks of HRT can increase if you have any of the following conditions:  Endometrial cancer.  Liver disease.  Heart disease.  Breast cancer.  History of blood clots.  History of stroke. Follow these instructions at home:  Take over-the-counter and prescription medicines only as told by your health care provider.  Get mammograms, pelvic exams, and medical checkups as often as told by your health care provider.  Have Pap tests done as often as told by your health care provider. A Pap test is sometimes called a Pap smear. It   is a screening test that is used to check for signs of cancer of the cervix and vagina. A Pap test can also identify the presence of infection or precancerous changes. Pap tests may be done: ? Every 3 years, starting at age 21. ? Every 5 years, starting after age 30, in combination with testing for human papillomavirus  (HPV). ? More often or less often depending on other medical conditions you have, your age, and other risk factors.  It is up to you to get the results of your Pap test. Ask your health care provider, or the department that is doing the test, when your results will be ready.  Keep all follow-up visits as told by your health care provider. This is important. Contact a health care provider if you have:  Pain or swelling in your legs.  Shortness of breath.  Chest pain.  Lumps or changes in your breasts or armpits.  Slurred speech.  Pain, burning, or bleeding when you urinate.  Unusual vaginal bleeding.  Dizziness or headaches.  Weakness or numbness in any part of your arms or legs.  Pain in your abdomen. Summary  Menopause is a normal time of life when menstrual periods stop completely and the ovaries stop producing the female hormones estrogen and progesterone.  Hormone replacement therapy (HRT) can relieve some of the symptoms of menopause.  HRT can reduce the frequency and severity of menopausal symptoms.  Risks of HRT vary depending on your individual health and medical history. This information is not intended to replace advice given to you by your health care provider. Make sure you discuss any questions you have with your health care provider. Document Revised: 09/27/2017 Document Reviewed: 09/27/2017 Elsevier Patient Education  2020 [employer] Inc[employer]  

## 2019-12-17 NOTE — Progress Notes (Signed)
GYNECOLOGY  VISIT   HPI: 54 y.o.   Married White or Caucasian Not Hispanic or Latino  female   [phone] with Patient's last menstrual period was 09/09/2019.   here for vasomotor symptoms. She is having hot flashes and trouble sleeping at night.   She had a mirena IUD inserted in 7/21 for contraception and severe dysmenorrhea. She cramping for a long time after insertion, definitely better.  She c/o a one month h/o hot flashes and night sweats. She is up at least 10 x at night, trouble falling back asleep. Exhausted.  She is getting ~6 hot flashes a day.  She is having vaginal dryness.   GYNECOLOGIC HISTORY: Patient's last menstrual period was 09/09/2019. Contraception:none  Menopausal hormone therapy: none         OB History    Gravida  2   Para  2   Term      Preterm  2   AB      Living  2     SAB      TAB      Ectopic      Multiple      Live Births  2              Patient Active Problem List   Diagnosis Date Noted  . Granuloma annulare 06/10/2017  . Melanoma of skin ([place]) 06/10/2017  . Ankle fracture, right 07/28/2015  . Primary hypothyroidism 07/28/2015  . Restless legs syndrome (RLS) 07/28/2015  . Extrinsic asthma 07/28/2015    Past Medical History:  Diagnosis Date  . Anemia   . Anxiety   . Asthma   . Thyroid disease    hypothyroid  . Urinary incontinence    not severe    Past Surgical History:  Procedure Laterality Date  . ANKLE ARTHROSCOPY WITH RECONSTRUCTION Right   . CESAREAN SECTION      Current Outpatient Medications  Medication Sig Dispense Refill  . levothyroxine (SYNTHROID) 88 MCG tablet TAKE 1 TABLET DAILY BEFORE BREAKFAST 90 tablet 3  . Multiple Vitamins-Minerals (MULTIVITAMIN PO) Take by mouth daily.    . naproxen sodium (ANAPROX DS) 550 MG tablet Take 1 tablet (550 mg total) by mouth 2 (two) times daily with a meal. Use prn cramps 30 tablet 1   No current facility-administered medications for this visit.     ALLERGIES:  Patient has no known allergies.  Family History  Problem Relation Age of Onset  . Arthritis Mother   . Cancer Mother 30       likely from reproductive organs   . Arthritis Father   . Heart disease Father   . Hypertension Father   . Hyperlipidemia Father   . Cancer Paternal Grandfather   . Colon cancer Paternal Grandfather   . Alzheimer's disease Paternal Grandmother   . Esophageal cancer Neg Hx   . Rectal cancer Neg Hx   . Stomach cancer Neg Hx    Mom with stage 4 cancer, diagnosed at 39. Primary unknown. Died at 10. It was thought to be from her reproductive system. Not aware of any genetic testing.  Social History   Socioeconomic History  . Marital status: Married    Spouse name: Not on file  . Number of children: Not on file  . Years of education: Not on file  . Highest education level: Not on file  Occupational History  . Not on file  Tobacco Use  . Smoking status: Former Research scientist (life sciences)  . Smokeless tobacco: Never Used  .  Tobacco comment: Quit 27 years ago  Substance and Sexual Activity  . Alcohol use: Yes    Alcohol/week: 0.0 standard drinks    Comment: socially  . Drug use: No  . Sexual activity: Not on file  Other Topics Concern  . Not on file  Social History Narrative  . Not on file   Social Determinants of Health   Financial Resource Strain:   . Difficulty of Paying Living Expenses: Not on file  Food Insecurity:   . Worried About Charity fundraiser in the Last Year: Not on file  . Ran Out of Food in the Last Year: Not on file  Transportation Needs:   . Lack of Transportation (Medical): Not on file  . Lack of Transportation (Non-Medical): Not on file  Physical Activity:   . Days of Exercise per Week: Not on file  . Minutes of Exercise per Session: Not on file  Stress:   . Feeling of Stress : Not on file  Social Connections:   . Frequency of Communication with Friends and Family: Not on file  . Frequency of Social Gatherings with Friends and Family: Not on  file  . Attends Religious Services: Not on file  . Active Member of Clubs or Organizations: Not on file  . Attends Archivist Meetings: Not on file  . Marital Status: Not on file  Intimate Partner Violence:   . Fear of Current or Ex-Partner: Not on file  . Emotionally Abused: Not on file  . Physically Abused: Not on file  . Sexually Abused: Not on file    Review of Systems  Psychiatric/Behavioral: The patient has insomnia.   All other systems reviewed and are negative.   PHYSICAL EXAMINATION:    BP 122/68   Pulse 78   Ht 5' 1.5" (1.562 m)   Wt 122 lb 6.4 oz (55.5 kg)   LMP 09/09/2019   SpO2 100%   BMI 22.75 kg/m     General appearance: alert, cooperative and appears stated age  ASSESSMENT Vasomotor symptoms, severe Has a mirena IUD, placed a few months ago    PLAN Discussed options for treatment, including ERT (has the iud for endometrial protection), SSRI's and Gabapentin Discussed risks and side effects She would like to start ERT, will start with the lowest dose patch and adjust as needed F/U in one month Information on HRT in mychart.

## 2020-01-17 ENCOUNTER — Other Ambulatory Visit: Payer: Self-pay

## 2020-01-17 ENCOUNTER — Encounter: Payer: Self-pay | Admitting: Obstetrics and Gynecology

## 2020-01-17 ENCOUNTER — Ambulatory Visit (INDEPENDENT_AMBULATORY_CARE_PROVIDER_SITE_OTHER): Payer: BC Managed Care – PPO | Admitting: Obstetrics and Gynecology

## 2020-01-17 VITALS — BP 130/80 | HR 72 | Ht 61.5 in | Wt 122.4 lb

## 2020-01-17 DIAGNOSIS — Z79890 Hormone replacement therapy (postmenopausal): Secondary | ICD-10-CM

## 2020-01-17 MED ORDER — ESTRADIOL 0.0375 MG/24HR TD PTTW: 1.0000 | MEDICATED_PATCH | TRANSDERMAL | 0 refills | Status: DC

## 2020-01-17 NOTE — Progress Notes (Signed)
GYNECOLOGY  VISIT   HPI: 54 y.o.   Married White or Caucasian Not Hispanic or Latino  female   [phone] with No LMP recorded.   here for follow up for HRT Patient states that she has seen some improvement. She has a mirena IUD, inserted in 7/21. She was started a low dose estrogen patch last month for severe vasomotor symptoms. Symptoms have improved, she has gone from waking up 10 x a night to 3-4 x a night. She is having a couple of hot flashes a day. Night time is the biggest issue, sleep is better, but still very broken up. Can't always fall back asleep.  GYNECOLOGIC HISTORY: No LMP recorded. Contraception:none  Menopausal hormone therapy: Vivelle Dot        OB History    Gravida  2   Para  2   Term      Preterm  2   AB      Living  2     SAB      IAB      Ectopic      Multiple      Live Births  2              Patient Active Problem List   Diagnosis Date Noted  . Granuloma annulare 06/10/2017  . Melanoma of skin ([place]) 06/10/2017  . Ankle fracture, right 07/28/2015  . Primary hypothyroidism 07/28/2015  . Restless legs syndrome (RLS) 07/28/2015  . Extrinsic asthma 07/28/2015    Past Medical History:  Diagnosis Date  . Anemia   . Anxiety   . Asthma   . Thyroid disease    hypothyroid  . Urinary incontinence    not severe    Past Surgical History:  Procedure Laterality Date  . ANKLE ARTHROSCOPY WITH RECONSTRUCTION Right   . CESAREAN SECTION      Current Outpatient Medications  Medication Sig Dispense Refill  . estradiol (VIVELLE-DOT) 0.025 MG/24HR Place 1 patch onto the skin 2 (two) times a week. 8 patch 1  . levothyroxine (SYNTHROID) 88 MCG tablet TAKE 1 TABLET DAILY BEFORE BREAKFAST 90 tablet 3  . Multiple Vitamins-Minerals (MULTIVITAMIN PO) Take by mouth daily.    . naproxen sodium (ANAPROX DS) 550 MG tablet Take 1 tablet (550 mg total) by mouth 2 (two) times daily with a meal. Use prn cramps 30 tablet 1   No current facility-administered  medications for this visit.     ALLERGIES: Patient has no known allergies.  Family History  Problem Relation Age of Onset  . Arthritis Mother   . Cancer Mother 50       likely from reproductive organs   . Arthritis Father   . Heart disease Father   . Hypertension Father   . Hyperlipidemia Father   . Cancer Paternal Grandfather   . Colon cancer Paternal Grandfather   . Alzheimer's disease Paternal Grandmother   . Esophageal cancer Neg Hx   . Rectal cancer Neg Hx   . Stomach cancer Neg Hx     Social History   Socioeconomic History  . Marital status: Married    Spouse name: Not on file  . Number of children: Not on file  . Years of education: Not on file  . Highest education level: Not on file  Occupational History  . Not on file  Tobacco Use  . Smoking status: Former Research scientist (life sciences)  . Smokeless tobacco: Never Used  . Tobacco comment: Quit 27 years ago  Substance and Sexual  Activity  . Alcohol use: Yes    Alcohol/week: 0.0 standard drinks    Comment: socially  . Drug use: No  . Sexual activity: Not on file  Other Topics Concern  . Not on file  Social History Narrative  . Not on file   Social Determinants of Health   Financial Resource Strain: Not on file  Food Insecurity: Not on file  Transportation Needs: Not on file  Physical Activity: Not on file  Stress: Not on file  Social Connections: Not on file  Intimate Partner Violence: Not on file    Review of Systems  All other systems reviewed and are negative.   PHYSICAL EXAMINATION:    BP 130/80   Pulse 72   Ht 5' 1.5" (1.562 m)   Wt 122 lb 6.4 oz (55.5 kg)   SpO2 99%   BMI 22.75 kg/m     General appearance: alert, cooperative and appears stated age  ASSESSMENT F/U on HRT. She was started on low dose ERT last month for severe vasomotor symptoms. She has a mirena IUD for endometrial protection. She is doing better, but still having significant sleep disturbance    PLAN Will increase to the 0.0375 mg  patch She will call with an update in a month

## 2020-01-18 ENCOUNTER — Encounter: Payer: Self-pay | Admitting: Obstetrics and Gynecology

## 2020-04-16 ENCOUNTER — Other Ambulatory Visit: Payer: Self-pay | Admitting: Obstetrics and Gynecology

## 2020-06-09 ENCOUNTER — Other Ambulatory Visit: Payer: Self-pay | Admitting: Family Medicine

## 2020-06-09 DIAGNOSIS — E039 Hypothyroidism, unspecified: Secondary | ICD-10-CM

## 2020-06-16 ENCOUNTER — Ambulatory Visit (INDEPENDENT_AMBULATORY_CARE_PROVIDER_SITE_OTHER): Payer: BC Managed Care – PPO | Admitting: Family Medicine

## 2020-06-16 ENCOUNTER — Other Ambulatory Visit: Payer: Self-pay

## 2020-06-16 ENCOUNTER — Encounter: Payer: Self-pay | Admitting: Family Medicine

## 2020-06-16 ENCOUNTER — Ambulatory Visit (INDEPENDENT_AMBULATORY_CARE_PROVIDER_SITE_OTHER): Payer: BC Managed Care – PPO

## 2020-06-16 VITALS — BP 131/74 | HR 70 | Temp 98.3°F | Resp 18 | Ht 61.5 in | Wt 125.8 lb

## 2020-06-16 DIAGNOSIS — Z0000 Encounter for general adult medical examination without abnormal findings: Secondary | ICD-10-CM | POA: Diagnosis not present

## 2020-06-16 DIAGNOSIS — M47812 Spondylosis without myelopathy or radiculopathy, cervical region: Secondary | ICD-10-CM | POA: Diagnosis not present

## 2020-06-16 DIAGNOSIS — E039 Hypothyroidism, unspecified: Secondary | ICD-10-CM

## 2020-06-16 DIAGNOSIS — Z809 Family history of malignant neoplasm, unspecified: Secondary | ICD-10-CM | POA: Diagnosis not present

## 2020-06-16 DIAGNOSIS — M542 Cervicalgia: Secondary | ICD-10-CM | POA: Diagnosis not present

## 2020-06-16 DIAGNOSIS — G5603 Carpal tunnel syndrome, bilateral upper limbs: Secondary | ICD-10-CM

## 2020-06-16 LAB — CBC WITH DIFFERENTIAL/PLATELET
Basophils Absolute: 0 10*3/uL (ref 0.0–0.1)
Basophils Relative: 0.5 % (ref 0.0–3.0)
Eosinophils Absolute: 0 10*3/uL (ref 0.0–0.7)
Eosinophils Relative: 0.6 % (ref 0.0–5.0)
HCT: 38.6 % (ref 36.0–46.0)
Hemoglobin: 13.4 g/dL (ref 12.0–15.0)
Lymphocytes Relative: 23.3 % (ref 12.0–46.0)
Lymphs Abs: 1.3 10*3/uL (ref 0.7–4.0)
MCHC: 34.7 g/dL (ref 30.0–36.0)
MCV: 93.6 fl (ref 78.0–100.0)
Monocytes Absolute: 0.5 10*3/uL (ref 0.1–1.0)
Monocytes Relative: 9.5 % (ref 3.0–12.0)
Neutro Abs: 3.8 10*3/uL (ref 1.4–7.7)
Neutrophils Relative %: 66.1 % (ref 43.0–77.0)
Platelets: 206 10*3/uL (ref 150.0–400.0)
RBC: 4.12 Mil/uL (ref 3.87–5.11)
RDW: 12.5 % (ref 11.5–15.5)
WBC: 5.8 10*3/uL (ref 4.0–10.5)

## 2020-06-16 LAB — LIPID PANEL
Cholesterol: 188 mg/dL (ref 0–200)
HDL: 55 mg/dL (ref >39.00)
LDL Cholesterol: 113 mg/dL — ABNORMAL HIGH (ref 0–99)
NonHDL: 132.79
Total CHOL/HDL Ratio: 3
Triglycerides: 101 mg/dL (ref 0.0–149.0)
VLDL: 20.2 mg/dL (ref 0.0–40.0)

## 2020-06-16 LAB — COMPREHENSIVE METABOLIC PANEL
ALT: 16 U/L (ref 0–35)
AST: 19 U/L (ref 0–37)
Albumin: 4.5 g/dL (ref 3.5–5.2)
Alkaline Phosphatase: 45 U/L (ref 39–117)
BUN: 17 mg/dL (ref 6–23)
CO2: 27 mEq/L (ref 19–32)
Calcium: 9.2 mg/dL (ref 8.4–10.5)
Chloride: 103 mEq/L (ref 96–112)
Creatinine, Ser: 0.85 mg/dL (ref 0.40–1.20)
GFR: 76.56 mL/min (ref >60.00)
Glucose, Bld: 80 mg/dL (ref 70–99)
Potassium: 3.8 mEq/L (ref 3.5–5.1)
Sodium: 139 mEq/L (ref 135–145)
Total Bilirubin: 0.6 mg/dL (ref 0.2–1.2)
Total Protein: 7.2 g/dL (ref 6.0–8.3)

## 2020-06-16 LAB — TSH: TSH: 1.85 u[IU]/mL (ref 0.35–4.50)

## 2020-06-16 LAB — T4, FREE: Free T4: 0.98 ng/dL (ref 0.60–1.60)

## 2020-06-16 MED ORDER — LEVOTHYROXINE SODIUM 88 MCG PO TABS: 88.0000 ug | ORAL_TABLET | Freq: Every day | ORAL | 3 refills | Status: DC

## 2020-06-16 MED ORDER — METHOCARBAMOL 500 MG PO TABS: 500.0000 mg | ORAL_TABLET | Freq: Four times a day (QID) | ORAL | 1 refills | Status: DC

## 2020-06-16 NOTE — Progress Notes (Signed)
Patient: Ashley Murphy MRN: [id number] DOB: 1965/09/30 PCP: Orma Flaming, MD     Subjective:  Chief Complaint  Patient presents with  . Annual Exam    Patient is here for a physical and also would like to discuss some neck stiffness that she has had for months.  . Neck Pain  . Hypothyroidism    HPI: The patient is a 55 y.o. female who presents today for annual exam. She denies any changes to past medical history. There have been no recent hospitalizations. They are following a well balanced diet and exercise plan. Weight has been stable. Complaints of neck pain.   No family hx of colon cancer or breast cancer in first degree relative.  Colon cancer paternal grandfather.   Hypothyroidism She appears euthyroid. Needs refill. Specifically denies any trouble swallowing, temperature intolerance, enlarged neck, dry skin, constipation, swelling.   Neck pain She states this started several months ago, but is progressively getting worse. Pain is in the back of the neck and radiates to both shoulders. Pain seems to be most severe on the right side of the neck into the trapezius. aggrevated by cold weather. She states it feels like bone hitting bone. Pain rated as a 5/10 on average. Pain is constant and then flairs to an 8/10 if she looks down. Only thing that helps is if she stretches and puts a pillow in just the right spot.  The pain is interfering with work, sleep, daily life. She feels like it got worse with covid when she constantly looking down at her computer.  She does remember getting injections in her neck years ago, but she can't recall anything except a frozen shoulder. She states the shots worked. She thinks she strained her neck from traveling to [place] every 2 weeks and carrying heavy luggage. She takes 2 aleve and this will sometimes this will help, but at other times doesn't touch it. She denies any weakness in her hands/arms, no tingling or burning. She does have some  tingling in her hands, but this seems more like CT.   Immunization History  Administered Date(s) Administered  . Influenza,inj,Quad PF,6+ Mos 11/20/2015  . PFIZER(Purple Top)SARS-COV-2 Vaccination 05/18/2019, 06/12/2019   Colonoscopy: 01/28/2016 Mammogram: 08/16/2019 Pap smear:  08/17/2019   Review of Systems  Constitutional: Negative for chills, fatigue and fever.  HENT: Negative for dental problem, ear pain, hearing loss and trouble swallowing.   Eyes: Negative for visual disturbance.  Respiratory: Negative for cough, chest tightness and shortness of breath.   Cardiovascular: Negative for chest pain, palpitations and leg swelling.  Gastrointestinal: Negative for abdominal pain, blood in stool, diarrhea and nausea.  Endocrine: Negative for cold intolerance, polydipsia, polyphagia and polyuria.  Genitourinary: Negative for dysuria and hematuria.  Musculoskeletal: Positive for neck pain. Negative for arthralgias.  Skin: Negative for rash.  Neurological: Negative for dizziness and headaches.  Psychiatric/Behavioral: Negative for dysphoric mood and sleep disturbance. The patient is not nervous/anxious.     Allergies Patient has No Known Allergies.  Past Medical History Patient  has a past medical history of Anemia, Anxiety, Asthma, Thyroid disease, and Urinary incontinence.  Surgical History Patient  has a past surgical history that includes Ankle arthroscopy with reconstruction (Right) and Cesarean section.  Family History Pateint's family history includes Alzheimer's disease in her paternal grandmother; Arthritis in her father and mother; Cancer in her paternal grandfather; Cancer (age of onset: 56) in her mother; Colon cancer in her paternal grandfather; Heart disease in her father; Hyperlipidemia in  her father; Hypertension in her father.  Social History Patient  reports that she has quit smoking. She has never used smokeless tobacco. She reports current alcohol use. She reports  that she does not use drugs.    Objective: Vitals:   06/16/20 0831  BP: 131/74  Pulse: 70  Resp: 18  Temp: 98.3 F (36.8 C)  TempSrc: Temporal  SpO2: 100%  Weight: 125 lb 12.8 oz (57.1 kg)  Height: 5' 1.5" (1.562 m)    Body mass index is 23.38 kg/m.  Physical Exam Vitals reviewed.  Constitutional:      Appearance: Normal appearance. She is well-developed and normal weight.  HENT:     Head: Normocephalic and atraumatic.     Right Ear: Tympanic membrane, ear canal and external ear normal.     Left Ear: Tympanic membrane, ear canal and external ear normal.     Mouth/Throat:     Mouth: Mucous membranes are moist.  Eyes:     Extraocular Movements: Extraocular movements intact.     Conjunctiva/sclera: Conjunctivae normal.     Pupils: Pupils are equal, round, and reactive to light.  Neck:     Thyroid: No thyromegaly.     Comments: She has decreased ROM with horizontal/side to side head movement due to pain/tighteness on the right side. ttp over right trapezius with large knot in muscle.  Cardiovascular:     Rate and Rhythm: Normal rate and regular rhythm.     Heart sounds: Normal heart sounds. No murmur heard.   Pulmonary:     Effort: Pulmonary effort is normal.     Breath sounds: Normal breath sounds.  Abdominal:     General: Abdomen is flat. Bowel sounds are normal. There is no distension.     Palpations: Abdomen is soft.     Tenderness: There is no abdominal tenderness.  Musculoskeletal:     Cervical back: Normal range of motion and neck supple. Tenderness present.     Comments: Normal strength in bilateral upper extremities. Intact hand grip. Normal upper DTR and sensation.   Lymphadenopathy:     Cervical: No cervical adenopathy.  Skin:    General: Skin is warm and dry.     Capillary Refill: Capillary refill takes less than 2 seconds.     Findings: No rash.  Neurological:     General: No focal deficit present.     Mental Status: She is alert and oriented to  person, place, and time.     Cranial Nerves: No cranial nerve deficit.     Sensory: No sensory deficit.     Motor: No weakness.     Coordination: Coordination normal.     Deep Tendon Reflexes: Reflexes normal.     Comments: +tinels on the right wrist. +phalens bilaterally   Psychiatric:        Mood and Affect: Mood normal.        Behavior: Behavior normal.    Drew Office Visit from 06/16/2020 in [place]  PHQ-2 Total Score 0      Cervical xray: no acute findings, official read pending.      Assessment/plan: 1. Annual physical exam Routine fasting labs today. Hm reviewed and she is UTD. Doing well overall. Discussed more exercise, but limited with neck right now. F/u in one year or as needed.  Patient counseling [x]    Nutrition: Stressed importance of moderation in sodium/caffeine intake, saturated fat and cholesterol, caloric balance, sufficient intake of fresh fruits, vegetables, fiber, calcium,  iron, and 1 mg of folate supplement per day (for females capable of pregnancy).  [x]    Stressed the importance of regular exercise.   []    Substance Abuse: Discussed cessation/primary prevention of tobacco, alcohol, or other drug use; driving or other dangerous activities under the influence; availability of treatment for abuse.   [x]    Injury prevention: Discussed safety belts, safety helmets, smoke detector, smoking near bedding or upholstery.   [x]    Sexuality: Discussed sexually transmitted diseases, partner selection, use of condoms, avoidance of unintended pregnancy  and contraceptive alternatives.  [x]    Dental health: Discussed importance of regular tooth brushing, flossing, and dental visits.  [x]    Health maintenance and immunizations reviewed. Please refer to Health maintenance section.    - CBC with Differential/Platelet - Comprehensive metabolic panel - Lipid panel  2. Primary hypothyroidism Appear euthyroid. Labs today and will send in  refill if normal.  - T4, free - TSH  3. Neck pain -appears more MSK today on clinical exam ,but does have past hx of injections. -xray -trial muscle relaxer and PT> may really benefit from dry needling -if fails this discussed MRI and discussed MRI will be needed if any radicular symptoms present.  - DG Cervical Spine Complete; Future - Ambulatory referral to Physical Therapy  4. Maternal family history of cancer  - Ambulatory referral to Genetics  5. Carpal tunnel bilateral -conservative therapy with thumb spica splints at night and voltaren gel QID.  -If no improvement discussed sending to ortho. She will let me know.   Return in about 1 year (around 06/16/2021) for annual, keep in touch wiht neck! Orma Flaming, MD [place]  06/16/2020

## 2020-06-16 NOTE — Patient Instructions (Addendum)
1) neck pain Honestly it feels like it's more your trapezius muscle that is so tight. Overtime your muscle sticks in this abnormal position so we have to reteach your muscles. Will xray to get baseline, send to PT and trial muscle relaxer. If this doesn't work or pain worsens will need MRI. I also need to know if any new pain, weakness or tingling down arms we need to MRI you regardless. Recommend massage, heating pad and voltaren gel up to four times a day   2) tingling in hands -exam clinical for carpal tunnel. I would get a thumb spica brace and wear this at night. Can also wear during the day if want to. voltaren gel (over the counter) can use 4x/day. If not getting better I send to sports med vs. Orthopedics.   3) routine fasting labs.   4) other GYN I like -[facility].  -[facility] -dr. Garwin Brothers   Preventive Care 72-74 Years Old, Female Preventive care refers to lifestyle choices and visits with your health care provider that can promote health and wellness. This includes:  A yearly physical exam. This is also called an annual wellness visit.  Regular dental and eye exams.  Immunizations.  Screening for certain conditions.  Healthy lifestyle choices, such as: ? Eating a healthy diet. ? Getting regular exercise. ? Not using drugs or products that contain nicotine and tobacco. ? Limiting alcohol use. What can I expect for my preventive care visit? Physical exam Your health care provider will check your:  Height and weight. These may be used to calculate your BMI (body mass index). BMI is a measurement that tells if you are at a healthy weight.  Heart rate and blood pressure.  Body temperature.  Skin for abnormal spots. Counseling Your health care provider may ask you questions about your:  Past medical problems.  Family's medical history.  Alcohol, tobacco, and drug use.  Emotional well-being.  Home life and relationship well-being.  Sexual  activity.  Diet, exercise, and sleep habits.  Work and work Statistician.  Access to firearms.  Method of birth control.  Menstrual cycle.  Pregnancy history. What immunizations do I need? Vaccines are usually given at various ages, according to a schedule. Your health care provider will recommend vaccines for you based on your age, medical history, and lifestyle or other factors, such as travel or where you work.   What tests do I need? Blood tests  Lipid and cholesterol levels. These may be checked every 5 years, or more often if you are over 48 years old.  Hepatitis C test.  Hepatitis B test. Screening  Lung cancer screening. You may have this screening every year starting at age 6 if you have a 30-pack-year history of smoking and currently smoke or have quit within the past 15 years.  Colorectal cancer screening. ? All adults should have this screening starting at age 15 and continuing until age 100. ? Your health care provider may recommend screening at age 83 if you are at increased risk. ? You will have tests every 1-10 years, depending on your results and the type of screening test.  Diabetes screening. ? This is done by checking your blood sugar (glucose) after you have not eaten for a while (fasting). ? You may have this done every 1-3 years.  Mammogram. ? This may be done every 1-2 years. ? Talk with your health care provider about when you should start having regular mammograms. This may depend on whether  you have a family history of breast cancer.  BRCA-related cancer screening. This may be done if you have a family history of breast, ovarian, tubal, or peritoneal cancers.  Pelvic exam and Pap test. ? This may be done every 3 years starting at age 67. ? Starting at age 43, this may be done every 5 years if you have a Pap test in combination with an HPV test. Other tests  STD (sexually transmitted disease) testing, if you are at risk.  Bone density scan.  This is done to screen for osteoporosis. You may have this scan if you are at high risk for osteoporosis. Talk with your health care provider about your test results, treatment options, and if necessary, the need for more tests. Follow these instructions at home: Eating and drinking  Eat a diet that includes fresh fruits and vegetables, whole grains, lean protein, and low-fat dairy products.  Take vitamin and mineral supplements as recommended by your health care provider.  Do not drink alcohol if: ? Your health care provider tells you not to drink. ? You are pregnant, may be pregnant, or are planning to become pregnant.  If you drink alcohol: ? Limit how much you have to 0-1 drink a day. ? Be aware of how much alcohol is in your drink. In the [place] one drink equals one 12 oz bottle of beer (355 mL), one 5 oz glass of wine (148 mL), or one 1 oz glass of hard liquor (44 mL).   Lifestyle  Take daily care of your teeth and gums. Brush your teeth every morning and night with fluoride toothpaste. Floss one time each day.  Stay active. Exercise for at least 30 minutes 5 or more days each week.  Do not use any products that contain nicotine or tobacco, such as cigarettes, e-cigarettes, and chewing tobacco. If you need help quitting, ask your health care provider.  Do not use drugs.  If you are sexually active, practice safe sex. Use a condom or other form of protection to prevent STIs (sexually transmitted infections).  If you do not wish to become pregnant, use a form of birth control. If you plan to become pregnant, see your health care provider for a prepregnancy visit.  If told by your health care provider, take low-dose aspirin daily starting at age 38.  Find healthy ways to cope with stress, such as: ? Meditation, yoga, or listening to music. ? Journaling. ? Talking to a trusted person. ? Spending time with friends and family. Safety  Always wear your seat belt while driving or  riding in a vehicle.  Do not drive: ? If you have been drinking alcohol. Do not ride with someone who has been drinking. ? When you are tired or distracted. ? While texting.  Wear a helmet and other protective equipment during sports activities.  If you have firearms in your house, make sure you follow all gun safety procedures. What's next?  Visit your health care provider once a year for an annual wellness visit.  Ask your health care provider how often you should have your eyes and teeth checked.  Stay up to date on all vaccines. This information is not intended to replace advice given to you by your health care provider. Make sure you discuss any questions you have with your health care provider. Document Revised: 10/30/2019 Document Reviewed: 10/06/2017 Elsevier Patient Education  2021 [employer].

## 2020-06-18 ENCOUNTER — Telehealth: Payer: Self-pay

## 2020-06-18 NOTE — Telephone Encounter (Signed)
Patient is calling in stating she was here a few days ago and talked with Dr.Wolfe about getting genetic testing done. She hasnt heard from anyone about the oncology referral.

## 2020-06-18 NOTE — Telephone Encounter (Signed)
Sending to lisa walker to follow up on this.  Thanks lisa! Dr. Rogers Blocker

## 2020-06-24 ENCOUNTER — Telehealth: Payer: Self-pay | Admitting: Genetic Counselor

## 2020-06-24 NOTE — Telephone Encounter (Signed)
Received a genetic counseling referral from dr. Rogers Blocker for fhx of cancer. Ms. Ballentine has been cld and scheduled to see Santiago Glad on 6/8 at 2pm. Pt aware to arrive 15 minutes early.

## 2020-06-27 ENCOUNTER — Ambulatory Visit: Payer: BC Managed Care – PPO | Admitting: Internal Medicine

## 2020-06-27 ENCOUNTER — Other Ambulatory Visit: Payer: Self-pay

## 2020-06-27 ENCOUNTER — Ambulatory Visit (INDEPENDENT_AMBULATORY_CARE_PROVIDER_SITE_OTHER): Payer: BC Managed Care – PPO | Admitting: Physical Therapy

## 2020-06-27 DIAGNOSIS — M62838 Other muscle spasm: Secondary | ICD-10-CM

## 2020-06-27 DIAGNOSIS — M542 Cervicalgia: Secondary | ICD-10-CM

## 2020-06-27 NOTE — Patient Instructions (Signed)
Access Code: [email or web address] URL: [email or web address] Date: 06/27/2020 Prepared by: Lyndee Hensen  Exercises Standing Backward Shoulder Rolls - 3 x daily - 1 sets - 10 reps Seated Cervical Sidebending Stretch - 2-3 x daily - 3 reps - 30 hold Seated Levator Scapulae Stretch - 2-3 x daily - 3 reps - 30 hold Standing Scapular Retraction - 2 x daily - 1 sets - 10 reps

## 2020-06-30 ENCOUNTER — Encounter: Payer: Self-pay | Admitting: Physical Therapy

## 2020-06-30 NOTE — Therapy (Signed)
[employer] [address] [place], [place], [address] Phone: [phone]   Fax:  [phone]  Physical Therapy Evaluation  Patient Details  Name: Ashley Murphy MRN: [id number] Date of Birth: [birth year] Referring Provider (PT): Orma Flaming   Encounter Date: 06/27/2020   PT End of Session - 06/30/20 0957    Visit Number 1    Number of Visits 12    Date for PT Re-Evaluation 08/08/20    Authorization Type BCBS    PT Start Time 1533    PT Stop Time 1610    PT Time Calculation (min) 37 min    Activity Tolerance Patient tolerated treatment well    Behavior During Therapy [facility] for tasks assessed/performed           Past Medical History:  Diagnosis Date  . Anemia   . Anxiety   . Asthma   . Thyroid disease    hypothyroid  . Urinary incontinence    not severe    Past Surgical History:  Procedure Laterality Date  . ANKLE ARTHROSCOPY WITH RECONSTRUCTION Right   . CESAREAN SECTION      There were no vitals filed for this visit.        [facility] PT Assessment - 06/30/20 0001      Assessment   Medical Diagnosis Neck pain    Referring Provider (PT) Orma Flaming    Hand Dominance Right    Prior Therapy no      Precautions   Precautions None      Balance Screen   Has the patient fallen in the past 6 months No      Prior Function   Level of Independence Independent      Cognition   Overall Cognitive Status Within Functional Limits for tasks assessed      ROM / Strength   AROM / PROM / Strength AROM;Strength      AROM   AROM Assessment Site Cervical    Cervical Flexion mild limitation/pain    Cervical Extension mod limitation/ pain    Cervical - Right Rotation 72    Cervical - Left Rotation 60      Strength   Overall Strength Comments 4+/5 shoulders, 4/5 scapular      Palpation   Palpation comment Tenderness and hypertonicity in UT and levator on R, tenderness into R cervical paraspinals, tender C4-7  with PAs.      Special Tests   Other special tests Neg ULTT,                      Objective measurements completed on examination: See above findings.       [facility] Adult PT Treatment/Exercise - 06/30/20 0001      Exercises   Exercises Neck      Neck Exercises: Seated   Shoulder Rolls 10 reps    Other Seated Exercise Scap retract x 15;      Modalities   Modalities Moist Heat      Manual Therapy   Manual Therapy Joint mobilization;Soft tissue mobilization;Manual Traction;Passive ROM    Manual therapy comments skilled palpation and monitoring of soft tissue with dry needling.    Joint Mobilization Cervical PAs    Soft tissue mobilization STM/TPR to UTs, R levator, and cervical paraspinals    Passive ROM Manual stretches for UT and levator    Manual Traction 10 sec x 8      Neck Exercises: Stretches   Upper Trapezius  Stretch 2 reps;30 seconds    Upper Trapezius Stretch Limitations seated    Levator Stretch 2 reps;30 seconds    Levator Stretch Limitations reviewed for HEP                  PT Education - 06/30/20 0957    Education Details PT POC, Exam findings, HEP    Person(s) Educated Patient    Methods Explanation;Demonstration;Tactile cues;Verbal cues;Handout    Comprehension Verbalized understanding;Returned demonstration;Verbal cues required;Tactile cues required;Need further instruction            PT Short Term Goals - 06/30/20 1231      PT SHORT TERM GOAL #1   Title Pt to be independent with initial HEP    Time 2    Period Weeks    Status New    Target Date 07/14/20             PT Long Term Goals - 06/30/20 1231      PT LONG TERM GOAL #1   Title Pt to report decreased pain in neck to 0-1/10 with activity and work duties.    Time 6    Period Weeks    Status New    Target Date 08/11/20      PT LONG TERM GOAL #2   Title Pt to demo soft tissue limitations in cervical region to be WNL, for decreased pain and improved ROM.     Time 6    Period Weeks    Status New    Target Date 08/11/20      PT LONG TERM GOAL #3   Title Pt to demo improved ROM of c-spine to be WNL and pain free, to improve ability for IADLs, and driving.    Time 6    Period Weeks    Status New    Target Date 08/11/20      PT LONG TERM GOAL #4   Title Pt to be independent with final HEP    Time 6    Period Weeks    Status New    Target Date 08/11/20                  Plan - 06/30/20 0959    Clinical Impression Statement Pt presents with primary complaint of increased pain in cervical region. Pt with limited and painful ROM, as well as increased muscle tension in UTs and cervical musculature. She has decreased postural awareness and decreased strength of scapular and postural muscles. Pt with decreased ability for full functional activities, work duties, driving, and IADLS, due to pain and deficits. Pt to benefit from skilled PT to improve deficits and pain. Dry needling performed today for muscle tension and pain, pt with good tolerance, will assess effects next visit.    Personal Factors and Comorbidities Time since onset of injury/illness/exacerbation    Examination-Activity Limitations Carry;Lift;Locomotion Level    Examination-Participation Restrictions Meal Prep;Cleaning;Occupation;Community Activity;Driving;Dorita Sciara    Stability/Clinical Decision Making Stable/Uncomplicated    Clinical Decision Making Low    Rehab Potential Good    PT Frequency 2x / week    PT Duration 6 weeks    PT Treatment/Interventions ADLs/Self Care Home Management;Cryotherapy;Electrical Stimulation;DME Instruction;Ultrasound;Traction;Moist Heat;Iontophoresis 4mg /ml Dexamethasone;Functional mobility training;Therapeutic activities;Therapeutic exercise;Neuromuscular re-education;Manual techniques;Patient/family education;Passive range of motion;Dry needling;Taping;Joint Manipulations;Spinal Manipulations;Vasopneumatic Device    PT Home Exercise Plan  [email or web address]    Consulted and Agree with Plan of Care Patient           Patient will benefit  from skilled therapeutic intervention in order to improve the following deficits and impairments:  Decreased range of motion,Impaired UE functional use,Increased muscle spasms,Decreased activity tolerance,Pain,Hypomobility,Impaired flexibility,Improper body mechanics,Decreased mobility,Decreased strength  Visit Diagnosis: Cervicalgia  Other muscle spasm     Problem List Patient Active Problem List   Diagnosis Date Noted  . Granuloma annulare 06/10/2017  . Melanoma of skin ([place]) 06/10/2017  . Ankle fracture, right 07/28/2015  . Primary hypothyroidism 07/28/2015  . Restless legs syndrome (RLS) 07/28/2015  . Extrinsic asthma 07/28/2015    Lyndee Hensen, PT, DPT 12:36 PM  06/30/20    [place], [place], [address] Phone: [phone]   Fax:  [phone]  Name: Ashley Murphy MRN: [id number] Date of Birth: 1965/11/20

## 2020-07-03 ENCOUNTER — Encounter: Payer: BC Managed Care – PPO | Admitting: Physical Therapy

## 2020-07-04 ENCOUNTER — Encounter: Payer: Self-pay | Admitting: Physical Therapy

## 2020-07-04 ENCOUNTER — Other Ambulatory Visit: Payer: Self-pay

## 2020-07-04 ENCOUNTER — Ambulatory Visit (INDEPENDENT_AMBULATORY_CARE_PROVIDER_SITE_OTHER): Payer: BC Managed Care – PPO | Admitting: Physical Therapy

## 2020-07-04 DIAGNOSIS — M62838 Other muscle spasm: Secondary | ICD-10-CM | POA: Diagnosis not present

## 2020-07-04 DIAGNOSIS — M542 Cervicalgia: Secondary | ICD-10-CM | POA: Diagnosis not present

## 2020-07-04 NOTE — Therapy (Signed)
[facility] [address] [place], [place], [address] Phone: [phone]   Fax:  [phone]  Physical Therapy Treatment  Patient Details  Name: Ashley Murphy MRN: [id number] Date of Birth: [birth year] Referring Provider (PT): Orma Flaming   Encounter Date: 07/04/2020   PT End of Session - 07/04/20 0951    Visit Number 2    Number of Visits 12    Date for PT Re-Evaluation 08/08/20    Authorization Type BCBS    PT Start Time 0850    PT Stop Time 0935    PT Time Calculation (min) 45 min    Activity Tolerance Patient tolerated treatment well    Behavior During Therapy [facility] for tasks assessed/performed           Past Medical History:  Diagnosis Date  . Anemia   . Anxiety   . Asthma   . Thyroid disease    hypothyroid  . Urinary incontinence    not severe    Past Surgical History:  Procedure Laterality Date  . ANKLE ARTHROSCOPY WITH RECONSTRUCTION Right   . CESAREAN SECTION      There were no vitals filed for this visit.   Subjective Assessment - 07/04/20 0857    Subjective Pt states mild pain this am, but significant pain yesterday. She has been trying to do HEP, but had a long day yesterday and did not take as many breaks.    Limitations Reading;Lifting;Writing;House hold activities    Diagnostic tests recent X-ray: Mild to moderate intervertebral disc space height  loss of C6-7 with uncovertebral hypertrophy. Mild to moderate  osseous neuroforaminal narrowing of LEFT C6-7    Patient Stated Goals Decreased pain, return to exercise    Currently in Pain? Yes    Pain Score 2     Pain Location Neck    Pain Orientation Left;Right    Pain Descriptors / Indicators Aching    Pain Type Chronic pain    Pain Radiating Towards up to 8/10 yesterday    Pain Onset More than a month ago    Pain Frequency Intermittent                             [facility] Adult PT Treatment/Exercise - 07/04/20 0001       Exercises   Exercises Neck      Neck Exercises: Seated   Shoulder Rolls 10 reps    Other Seated Exercise Scap retract x 15;      Neck Exercises: Sidelying   Other Sidelying Exercise thoracic rotation x 10 bil;      Neck Exercises: Prone   Other Prone Exercise cat/cow for thoracic mobility x 10      Modalities   Modalities Moist Heat      Manual Therapy   Manual Therapy Joint mobilization;Soft tissue mobilization;Manual Traction;Passive ROM    Manual therapy comments skilled palpation and monitoring of soft tissue with dry needling.    Joint Mobilization Cervical and thoracic PAs    Soft tissue mobilization STM/TPR to R UT,levator, and sub occipitals, SOR    Passive ROM --    Manual Traction 10 sec x 8      Neck Exercises: Stretches   Upper Trapezius Stretch 2 reps;30 seconds    Upper Trapezius Stretch Limitations (mild pain on R)    Levator Stretch 2 reps;30 seconds    Levator Stretch Limitations --    Other Neck Stretches  supine on towel roll, pec stetch x2 min            Trigger Point Dry Needling - 07/04/20 0001    Consent Given? Yes    Education Handout Provided Previously provided    Muscles Treated Head and Neck Upper trapezius;Levator scapulae    Upper Trapezius Response Twitch reponse elicited;Palpable increased muscle length   R   Levator Scapulae Response Twitch response elicited;Palpable increased muscle length   R               PT Education - 07/04/20 0951    Education Details updated HEP    Person(s) Educated Patient    Methods Explanation;Demonstration;Tactile cues;Verbal cues;Handout    Comprehension Verbalized understanding;Returned demonstration;Verbal cues required;Tactile cues required;Need further instruction            PT Short Term Goals - 06/30/20 1231      PT SHORT TERM GOAL #1   Title Pt to be independent with initial HEP    Time 2    Period Weeks    Status New    Target Date 07/14/20             PT Long Term Goals -  06/30/20 1231      PT LONG TERM GOAL #1   Title Pt to report decreased pain in neck to 0-1/10 with activity and work duties.    Time 6    Period Weeks    Status New    Target Date 08/11/20      PT LONG TERM GOAL #2   Title Pt to demo soft tissue limitations in cervical region to be WNL, for decreased pain and improved ROM.    Time 6    Period Weeks    Status New    Target Date 08/11/20      PT LONG TERM GOAL #3   Title Pt to demo improved ROM of c-spine to be WNL and pain free, to improve ability for IADLs, and driving.    Time 6    Period Weeks    Status New    Target Date 08/11/20      PT LONG TERM GOAL #4   Title Pt to be independent with final HEP    Time 6    Period Weeks    Status New    Target Date 08/11/20                 Plan - 07/04/20 0955    Clinical Impression Statement Pt with continued soreness in R levator, and increased tenderness today in upper/mid t-spine. THer ex added for increased mobility of t-spine, and Dry needling and DTM done for muscle tension and pain in levator. Pt with difficulty with neck extension for cat/cow. Pt with episodes of hands going numb with positions for ther ex today, more with shoulder ROM vs wrist motion. Pt states ongoing carpal tunnel, but will continue to assess for more symptoms of thoracic outlet vs carpal tunnel. Pt to benefit from continued care for neck and thoracic pain, relieving muscle tension and progressing to more strengthening for postural muscles once pain has imroved.    Personal Factors and Comorbidities Time since onset of injury/illness/exacerbation    Examination-Activity Limitations Carry;Lift;Locomotion Level    Examination-Participation Restrictions Meal Prep;Cleaning;Occupation;Community Activity;Driving;[facility]    Stability/Clinical Decision Making Stable/Uncomplicated    Rehab Potential Good    PT Frequency 2x / week    PT Duration 6 weeks    PT  Treatment/Interventions ADLs/Self Care  Home Management;Cryotherapy;Electrical Stimulation;DME Instruction;Ultrasound;Traction;Moist Heat;Iontophoresis 4mg /ml Dexamethasone;Functional mobility training;Therapeutic activities;Therapeutic exercise;Neuromuscular re-education;Manual techniques;Patient/family education;Passive range of motion;Dry needling;Taping;Joint Manipulations;Spinal Manipulations;Vasopneumatic Device    PT Home Exercise Plan [email or web address]    Consulted and Agree with Plan of Care Patient           Patient will benefit from skilled therapeutic intervention in order to improve the following deficits and impairments:  Decreased range of motion,Impaired UE functional use,Increased muscle spasms,Decreased activity tolerance,Pain,Hypomobility,Impaired flexibility,Improper body mechanics,Decreased mobility,Decreased strength  Visit Diagnosis: Cervicalgia  Other muscle spasm     Problem List Patient Active Problem List   Diagnosis Date Noted  . Granuloma annulare 06/10/2017  . Melanoma of skin ([place]) 06/10/2017  . Ankle fracture, right 07/28/2015  . Primary hypothyroidism 07/28/2015  . Restless legs syndrome (RLS) 07/28/2015  . Extrinsic asthma 07/28/2015   Lyndee Hensen, PT, DPT 10:02 AM  07/04/20    [facility] [place], [place], [address] Phone: [phone]   Fax:  [phone]  Name: Chalice Philbert MRN: [id number] Date of Birth: 1965/03/25

## 2020-07-10 ENCOUNTER — Other Ambulatory Visit: Payer: Self-pay

## 2020-07-10 ENCOUNTER — Encounter: Payer: BC Managed Care – PPO | Admitting: Physical Therapy

## 2020-07-10 ENCOUNTER — Encounter: Payer: Self-pay | Admitting: Physical Therapy

## 2020-07-10 ENCOUNTER — Ambulatory Visit (INDEPENDENT_AMBULATORY_CARE_PROVIDER_SITE_OTHER): Payer: BC Managed Care – PPO | Admitting: Physical Therapy

## 2020-07-10 DIAGNOSIS — M62838 Other muscle spasm: Secondary | ICD-10-CM | POA: Diagnosis not present

## 2020-07-10 DIAGNOSIS — M542 Cervicalgia: Secondary | ICD-10-CM

## 2020-07-10 NOTE — Patient Instructions (Signed)
Access Code: [email or web address] URL: [email or web address] Date: 07/10/2020 Prepared by: Daleen Bo  Exercises Standing Backward Shoulder Rolls - 3 x daily - 1 sets - 10 reps Seated Cervical Sidebending Stretch - 2-3 x daily - 3 reps - 30 hold Seated Levator Scapulae Stretch - 2-3 x daily - 3 reps - 30 hold Standing Scapular Retraction - 2 x daily - 1 sets - 10 reps Supine Chest Stretch on Foam Roll - 2 x daily - 3 reps - 30 hold Sidelying Thoracic Rotation with Open Book - 1 x daily - 12 sets - 10 reps - 5 hold Cat Cow - 2 x daily - 1 sets - 10 reps - 5 hold Seated Scalene Stretch with Towel - 2 x daily - 2-3 x weekly - 1 sets - 3 reps - 30 hold

## 2020-07-10 NOTE — Therapy (Signed)
[facility] [address] [place], [place], [address] Phone: [phone]   Fax:  [phone]  Physical Therapy Treatment  Patient Details  Name: Ashley Murphy MRN: [id number] Date of Birth: 1965/09/01 Referring Provider (PT): Orma Flaming   Encounter Date: 07/10/2020   PT End of Session - 07/10/20 0852    Visit Number 3    Number of Visits 12    Date for PT Re-Evaluation 08/08/20    Authorization Type BCBS    PT Start Time 0803    PT Stop Time 0847    PT Time Calculation (min) 44 min    Activity Tolerance Patient tolerated treatment well;Patient limited by pain    Behavior During Therapy [facility] for tasks assessed/performed           Past Medical History:  Diagnosis Date  . Anemia   . Anxiety   . Asthma   . Thyroid disease    hypothyroid  . Urinary incontinence    not severe    Past Surgical History:  Procedure Laterality Date  . ANKLE ARTHROSCOPY WITH RECONSTRUCTION Right   . CESAREAN SECTION      There were no vitals filed for this visit.   Subjective Assessment - 07/10/20 0801    Subjective Pt states that the R sided pain is going a little better. The pain across the shoulders is about the same. She states it feels like "bones are rubbing over each other." Pt states the NT is still occuring down the R side and the hand will occasionally turn white.    Limitations Reading;Lifting;Writing;House hold activities    Diagnostic tests recent X-ray: Mild to moderate intervertebral disc space height  loss of C6-7 with uncovertebral hypertrophy. Mild to moderate  osseous neuroforaminal narrowing of LEFT C6-7    Patient Stated Goals Decreased pain, return to exercise    Pain Score 3     Pain Location Neck    Pain Orientation Left;Right    Pain Descriptors / Indicators Aching    Pain Onset More than a month ago                             [facility] Adult PT Treatment/Exercise - 07/10/20 0001       Exercises   Exercises Neck      Neck Exercises: Seated   Neck Retraction 15 reps;3 secs    Shoulder Rolls 10 reps    Other Seated Exercise Scap retract x 15;   stopped due to pain     Neck Exercises: Supine   Other Supine Exercise supine chin nod 10x 3x, towel roll under occiput               Neck Exercises: Prone   Other Prone Exercise cat/cow for thoracic mobility x 10 (avoid C/S ext)              Manual Therapy   Manual Therapy Joint mobilization;Soft tissue mobilization;Manual Traction;Passive ROM    Manual therapy comments --    Joint Mobilization Cervical and thoracic PAs, grade III; seated CTJ manip no cavitation; R first rib inf mob grade II    Soft tissue mobilization L rot and SB MET R scalenes    Manual Traction --      Neck Exercises: Stretches   Upper Trapezius Stretch 2 reps;30 seconds                Other Neck Stretches  R scalenes stretch 30s 3x; L SB and R rot                  PT Education - 07/10/20 0851    Education Details regular time postural changes, acceptable levels of pain, anatomy, exercise progression, muscle firing, HEP    Person(s) Educated Patient    Methods Explanation;Demonstration;Tactile cues;Verbal cues;Handout    Comprehension Verbalized understanding;Returned demonstration;Verbal cues required;Tactile cues required            PT Short Term Goals - 06/30/20 1231      PT SHORT TERM GOAL #1   Title Pt to be independent with initial HEP    Time 2    Period Weeks    Status New    Target Date 07/14/20             PT Long Term Goals - 06/30/20 1231      PT LONG TERM GOAL #1   Title Pt to report decreased pain in neck to 0-1/10 with activity and work duties.    Time 6    Period Weeks    Status New    Target Date 08/11/20      PT LONG TERM GOAL #2   Title Pt to demo soft tissue limitations in cervical region to be WNL, for decreased pain and improved ROM.    Time 6    Period Weeks    Status New    Target  Date 08/11/20      PT LONG TERM GOAL #3   Title Pt to demo improved ROM of c-spine to be WNL and pain free, to improve ability for IADLs, and driving.    Time 6    Period Weeks    Status New    Target Date 08/11/20      PT LONG TERM GOAL #4   Title Pt to be independent with final HEP    Time 6    Period Weeks    Status New    Target Date 08/11/20                 Plan - 07/10/20 0853    Clinical Impression Statement Pt presents with extension intolerance and R sided scalene hypertonicity along first rib. Pt had abolishment of symptoms following manual, but had increase irritation with some HEP. Pt advised to keep pain <2/10 above baseline level of pain. Pt had most relief of sx with gentle ROM chin tuck, supine chin nod, as well as R scalene stretch. Plan to review cervical postural exercise and reassess R sided scalene/first rib. Pt to benefit from continued care for neck and thoracic pain, relieving muscle tension and progressing to more strengthening for postural muscles once pain has imroved. Pt would benefit from continued skilled therapy in order to reach goals and maximize postural strength and stability.    Personal Factors and Comorbidities Time since onset of injury/illness/exacerbation    Examination-Activity Limitations Carry;Lift;Locomotion Level    Examination-Participation Restrictions Meal Prep;Cleaning;Occupation;Community Activity;Driving;[employer]    Stability/Clinical Decision Making Stable/Uncomplicated    Rehab Potential Good    PT Frequency 2x / week    PT Duration 6 weeks    PT Treatment/Interventions ADLs/Self Care Home Management;Cryotherapy;Electrical Stimulation;DME Instruction;Ultrasound;Traction;Moist Heat;Iontophoresis 22m/ml Dexamethasone;Functional mobility training;Therapeutic activities;Therapeutic exercise;Neuromuscular re-education;Manual techniques;Patient/family education;Passive range of motion;Dry needling;Taping;Joint  Manipulations;Spinal Manipulations;Vasopneumatic Device    PT Next Visit Plan review scalene stretch, chin tuck, possibly retry first rib mob    [place]  Consulted and Agree with Plan of Care Patient           Patient will benefit from skilled therapeutic intervention in order to improve the following deficits and impairments:  Decreased range of motion,Impaired UE functional use,Increased muscle spasms,Decreased activity tolerance,Pain,Hypomobility,Impaired flexibility,Improper body mechanics,Decreased mobility,Decreased strength  Visit Diagnosis: Cervicalgia  Other muscle spasm     Problem List Patient Active Problem List   Diagnosis Date Noted  . Granuloma annulare 06/10/2017  . Melanoma of skin ([place]) 06/10/2017  . Ankle fracture, right 07/28/2015  . Primary hypothyroidism 07/28/2015  . Restless legs syndrome (RLS) 07/28/2015  . Extrinsic asthma 07/28/2015    Daleen Bo PT, DPT 07/10/20 9:01 AM   [facility] [place], [place], [address] Phone: [phone]   Fax:  [phone]  Name: Ariam Mol MRN: [id number] Date of Birth: Mar 20, 1965

## 2020-07-11 ENCOUNTER — Encounter: Payer: BC Managed Care – PPO | Admitting: Physical Therapy

## 2020-07-14 ENCOUNTER — Ambulatory Visit (INDEPENDENT_AMBULATORY_CARE_PROVIDER_SITE_OTHER): Payer: BC Managed Care – PPO | Admitting: Physical Therapy

## 2020-07-14 ENCOUNTER — Other Ambulatory Visit: Payer: Self-pay

## 2020-07-14 ENCOUNTER — Encounter: Payer: Self-pay | Admitting: Physical Therapy

## 2020-07-14 DIAGNOSIS — M542 Cervicalgia: Secondary | ICD-10-CM | POA: Diagnosis not present

## 2020-07-14 DIAGNOSIS — M62838 Other muscle spasm: Secondary | ICD-10-CM

## 2020-07-14 NOTE — Therapy (Signed)
[facility] [address] [place], [place], [address] Phone: [phone]   Fax:  [phone]  Physical Therapy Treatment  Patient Details  Name: Ashley Murphy MRN: [id number] Date of Birth: 05-03-1965 Referring Provider (PT): Orma Flaming   Encounter Date: 07/14/2020   PT End of Session - 07/14/20 1336    Visit Number 4    Number of Visits 12    Date for PT Re-Evaluation 08/08/20    Authorization Type BCBS    PT Start Time 0805    PT Stop Time 0845    PT Time Calculation (min) 40 min    Activity Tolerance Patient tolerated treatment well;Patient limited by pain    Behavior During Therapy [facility] for tasks assessed/performed           Past Medical History:  Diagnosis Date  . Anemia   . Anxiety   . Asthma   . Thyroid disease    hypothyroid  . Urinary incontinence    not severe    Past Surgical History:  Procedure Laterality Date  . ANKLE ARTHROSCOPY WITH RECONSTRUCTION Right   . CESAREAN SECTION      There were no vitals filed for this visit.   Subjective Assessment - 07/14/20 1335    Subjective Pt states continued pain and difficulty with activity that involves looking up.    Currently in Pain? Yes    Pain Score 4     Pain Location Neck    Pain Orientation Left;Right    Pain Descriptors / Indicators Aching    Pain Type Chronic pain    Pain Onset More than a month ago    Pain Frequency Intermittent                             [facility] Adult PT Treatment/Exercise - 07/14/20 0001      Exercises   Exercises Neck      Neck Exercises: Seated   Neck Retraction 15 reps;3 secs    Other Seated Exercise --   stopped due to pain     Neck Exercises: Supine   Other Supine Exercise supine chin nod 10x 3x, towel roll under occiput    Other Supine Exercise Pec stretch with UE nerve glide x 20 bil;      Manual Therapy   Manual Therapy Joint mobilization;Soft tissue mobilization;Manual  Traction;Passive ROM    Joint Mobilization Cervical and thoracic PAs, grade III; R first rib inf mob grade II    Soft tissue mobilization TPR R levator    Passive ROM LAD bil shoulders, PROM for cervical retraction and extension x 10;    Manual Traction 10 sec x 10      Neck Exercises: Stretches   Levator Stretch 2 reps;30 seconds    Other Neck Stretches R scalenes stretch 30s 3x; L SB and R rot                    PT Short Term Goals - 06/30/20 1231      PT SHORT TERM GOAL #1   Title Pt to be independent with initial HEP    Time 2    Period Weeks    Status New    Target Date 07/14/20             PT Long Term Goals - 06/30/20 1231      PT LONG TERM GOAL #1   Title Pt to  report decreased pain in neck to 0-1/10 with activity and work duties.    Time 6    Period Weeks    Status New    Target Date 08/11/20      PT LONG TERM GOAL #2   Title Pt to demo soft tissue limitations in cervical region to be WNL, for decreased pain and improved ROM.    Time 6    Period Weeks    Status New    Target Date 08/11/20      PT LONG TERM GOAL #3   Title Pt to demo improved ROM of c-spine to be WNL and pain free, to improve ability for IADLs, and driving.    Time 6    Period Weeks    Status New    Target Date 08/11/20      PT LONG TERM GOAL #4   Title Pt to be independent with final HEP    Time 6    Period Weeks    Status New    Target Date 08/11/20                 Plan - 07/14/20 1338    Clinical Impression Statement Pt with continued diffiuclty with extension ROM, with increased pain. She has limitations for R SB and rotation as well. She has improved muscle tension, but still has some discomfort in R levator as well as 1st rib and scalenes. Focus on manual for improving ROM and muscle tension today, as well as ther ex for same.    Personal Factors and Comorbidities Time since onset of injury/illness/exacerbation    Examination-Activity Limitations  Carry;Lift;Locomotion Level    Examination-Participation Restrictions Meal Prep;Cleaning;Occupation;Community Activity;Driving;[facility]    Stability/Clinical Decision Making Stable/Uncomplicated    Rehab Potential Good    PT Frequency 2x / week    PT Duration 6 weeks    PT Treatment/Interventions ADLs/Self Care Home Management;Cryotherapy;Electrical Stimulation;DME Instruction;Ultrasound;Traction;Moist Heat;Iontophoresis 4mg /ml Dexamethasone;Functional mobility training;Therapeutic activities;Therapeutic exercise;Neuromuscular re-education;Manual techniques;Patient/family education;Passive range of motion;Dry needling;Taping;Joint Manipulations;Spinal Manipulations;Vasopneumatic Device    PT Next Visit Plan review scalene stretch, chin tuck, possibly retry first rib mob    PT Home Exercise Plan [email or web address]    Consulted and Agree with Plan of Care Patient           Patient will benefit from skilled therapeutic intervention in order to improve the following deficits and impairments:  Decreased range of motion,Impaired UE functional use,Increased muscle spasms,Decreased activity tolerance,Pain,Hypomobility,Impaired flexibility,Improper body mechanics,Decreased mobility,Decreased strength  Visit Diagnosis: Cervicalgia  Other muscle spasm     Problem List Patient Active Problem List   Diagnosis Date Noted  . Granuloma annulare 06/10/2017  . Melanoma of skin ([place]) 06/10/2017  . Ankle fracture, right 07/28/2015  . Primary hypothyroidism 07/28/2015  . Restless legs syndrome (RLS) 07/28/2015  . Extrinsic asthma 07/28/2015    Lyndee Hensen, PT, DPT 1:45 PM  07/14/20    [facility], [place], [address] Phone: [phone]   Fax:  [phone]  Name: Ashley Murphy MRN: [id number] Date of Birth: 03/06/1965

## 2020-07-16 ENCOUNTER — Inpatient Hospital Stay: Payer: BC Managed Care – PPO | Admitting: Genetic Counselor

## 2020-07-16 ENCOUNTER — Other Ambulatory Visit: Payer: Self-pay

## 2020-07-16 ENCOUNTER — Ambulatory Visit (INDEPENDENT_AMBULATORY_CARE_PROVIDER_SITE_OTHER): Payer: BC Managed Care – PPO | Admitting: Physical Therapy

## 2020-07-16 ENCOUNTER — Inpatient Hospital Stay: Payer: BC Managed Care – PPO

## 2020-07-16 DIAGNOSIS — M62838 Other muscle spasm: Secondary | ICD-10-CM | POA: Diagnosis not present

## 2020-07-16 DIAGNOSIS — M542 Cervicalgia: Secondary | ICD-10-CM

## 2020-07-17 ENCOUNTER — Encounter: Payer: Self-pay | Admitting: Physical Therapy

## 2020-07-17 NOTE — Therapy (Signed)
[facility] [address] [place], [place], [address] Phone: [phone]   Fax:  [phone]  Physical Therapy Treatment  Patient Details  Name: [name] MRN: [id number] Date of Birth: [birth year] Referring Provider (PT): Orma Flaming   Encounter Date: 07/16/2020   PT End of Session - 07/17/20 1011     Visit Number 5    Number of Visits 12    Date for PT Re-Evaluation 08/08/20    Authorization Type BCBS    PT Start Time 1605    PT Stop Time 1648    PT Time Calculation (min) 43 min    Activity Tolerance Patient tolerated treatment well;Patient limited by pain    Behavior During Therapy [facility] for tasks assessed/performed             Past Medical History:  Diagnosis Date   Anemia    Anxiety    Asthma    Thyroid disease    hypothyroid   Urinary incontinence    not severe    Past Surgical History:  Procedure Laterality Date   ANKLE ARTHROSCOPY WITH RECONSTRUCTION Right    CESAREAN SECTION      There were no vitals filed for this visit.   Subjective Assessment - 07/17/20 1005     Subjective Pt states bothersome neck tightness and pain. Not sure if exercises are helping of making it sore.    Currently in Pain? Yes    Pain Score 4     Pain Location Neck    Pain Orientation Right;Left    Pain Descriptors / Indicators Aching    Pain Type Chronic pain    Pain Onset More than a month ago    Pain Frequency Intermittent                               [facility] Adult PT Treatment/Exercise - 07/17/20 0001       Neck Exercises: Seated   Neck Retraction 10 reps;3 secs    Shoulder Rolls 10 reps    Other Seated Exercise Scap retract x 15;      Manual Therapy   Manual therapy comments skilled palpation and monitoring of soft tissue with dry needling    Joint Mobilization Cervical and thoracic PAs, grade III; R first rib inf mob grade II    Passive ROM LAD R shoulders, PROM for cervical  retraction and extension x 10;    Manual Traction 10 sec x 10      Neck Exercises: Stretches   Corner Stretch 3 reps;30 seconds    Chest Stretch --   in doorway   Lower Cervical/Upper Thoracic Stretch 3 reps;10 seconds   seated   Other Neck Stretches R scalenes stretch 30s 3x; L SB and R rot              Trigger Point Dry Needling - 07/17/20 0001     Consent Given? Yes    Education Handout Provided Previously provided    Muscles Treated Head and Neck Suboccipitals    Upper Trapezius Response Twitch reponse elicited;Palpable increased muscle length    Suboccipitals Response Palpable increased muscle length   L and R   Levator Scapulae Response Twitch response elicited;Palpable increased muscle length                    PT Short Term Goals - 06/30/20 1231       PT  SHORT TERM GOAL #1   Title Pt to be independent with initial HEP    Time 2    Period Weeks    Status New    Target Date 07/14/20               PT Long Term Goals - 06/30/20 1231       PT LONG TERM GOAL #1   Title Pt to report decreased pain in neck to 0-1/10 with activity and work duties.    Time 6    Period Weeks    Status New    Target Date 08/11/20      PT LONG TERM GOAL #2   Title Pt to demo soft tissue limitations in cervical region to be WNL, for decreased pain and improved ROM.    Time 6    Period Weeks    Status New    Target Date 08/11/20      PT LONG TERM GOAL #3   Title Pt to demo improved ROM of c-spine to be WNL and pain free, to improve ability for IADLs, and driving.    Time 6    Period Weeks    Status New    Target Date 08/11/20      PT LONG TERM GOAL #4   Title Pt to be independent with final HEP    Time 6    Period Weeks    Status New    Target Date 08/11/20                   Plan - 07/17/20 1012     Clinical Impression Statement Pt with improved ability for R rotation ROM today, as well as some decrease in tenderness in 1st rib region. She  continues to have dificulty with active extension ROM, with significant pain. Passive extension non painful in supine today . She does have muslce tension in R levator and into high cervical region, dry needling repeated today for this,with good tolerance. R hand still going numb with most activities, and has not changed significantly. Pt will require continued care.    Personal Factors and Comorbidities Time since onset of injury/illness/exacerbation    Examination-Activity Limitations Carry;Lift;Locomotion Level    Examination-Participation Restrictions Meal Prep;Cleaning;Occupation;Community Activity;Driving;[facility]    Stability/Clinical Decision Making Stable/Uncomplicated    Rehab Potential Good    PT Frequency 2x / week    PT Duration 6 weeks    PT Treatment/Interventions ADLs/Self Care Home Management;Cryotherapy;Electrical Stimulation;DME Instruction;Ultrasound;Traction;Moist Heat;Iontophoresis 4mg /ml Dexamethasone;Functional mobility training;Therapeutic activities;Therapeutic exercise;Neuromuscular re-education;Manual techniques;Patient/family education;Passive range of motion;Dry needling;Taping;Joint Manipulations;Spinal Manipulations;Vasopneumatic Device    PT Next Visit Plan review scalene stretch, chin tuck, possibly retry first rib mob    PT Home Exercise Plan [id number]    Consulted and Agree with Plan of Care Patient             Patient will benefit from skilled therapeutic intervention in order to improve the following deficits and impairments:  Decreased range of motion, Impaired UE functional use, Increased muscle spasms, Decreased activity tolerance, Pain, Hypomobility, Impaired flexibility, Improper body mechanics, Decreased mobility, Decreased strength  Visit Diagnosis: Cervicalgia  Other muscle spasm     Problem List Patient Active Problem List   Diagnosis Date Noted   Granuloma annulare 06/10/2017   Melanoma of skin ([place]) 06/10/2017   Ankle  fracture, right 07/28/2015   Primary hypothyroidism 07/28/2015   Restless legs syndrome (RLS) 07/28/2015   Extrinsic asthma 07/28/2015   Ashley Murphy, PT, DPT  10:22 AM  07/17/20    [facility] [place], [place], [address] Phone: [phone]   Fax:  [phone]  Name: Ashley Murphy MRN: [id number] Date of Birth: 22-Jun-1965

## 2020-07-23 ENCOUNTER — Ambulatory Visit (INDEPENDENT_AMBULATORY_CARE_PROVIDER_SITE_OTHER): Payer: BC Managed Care – PPO | Admitting: Physical Therapy

## 2020-07-23 ENCOUNTER — Other Ambulatory Visit: Payer: Self-pay

## 2020-07-23 ENCOUNTER — Encounter: Payer: Self-pay | Admitting: Physical Therapy

## 2020-07-23 DIAGNOSIS — M62838 Other muscle spasm: Secondary | ICD-10-CM | POA: Diagnosis not present

## 2020-07-23 DIAGNOSIS — M542 Cervicalgia: Secondary | ICD-10-CM

## 2020-07-23 NOTE — Therapy (Signed)
[facility], [place], [address] Phone: [phone]   Fax:  [phone]  Physical Therapy Treatment  Patient Details  Name: Ashley Murphy MRN: [id number] Date of Birth: 1965/08/14 Referring Provider (PT): Orma Flaming   Encounter Date: 07/23/2020   PT End of Session - 07/23/20 0933     Visit Number 6    Number of Visits 12    Date for PT Re-Evaluation 08/08/20    Authorization Type BCBS    PT Start Time 0807    PT Stop Time 0848    PT Time Calculation (min) 41 min    Activity Tolerance Patient tolerated treatment well;Patient limited by pain    Behavior During Therapy [facility] for tasks assessed/performed             Past Medical History:  Diagnosis Date   Anemia    Anxiety    Asthma    Thyroid disease    hypothyroid   Urinary incontinence    not severe    Past Surgical History:  Procedure Laterality Date   ANKLE ARTHROSCOPY WITH RECONSTRUCTION Right    CESAREAN SECTION      There were no vitals filed for this visit.   Subjective Assessment - 07/23/20 0930     Subjective Pt states some improvements in pain since last visit , 1 week ago. She does have continued pain at R levator region up into R side of neck. R hand still going numb several times/day.    Currently in Pain? Yes    Pain Score 3     Pain Location Neck    Pain Orientation Right;Left    Pain Descriptors / Indicators Aching    Pain Type Chronic pain    Pain Onset More than a month ago                               [facility] Adult PT Treatment/Exercise - 07/23/20 0001       Neck Exercises: Standing   Other Standing Exercises Scap retract x 10; with GTB x 15; Bil shoulder ER RTB x 10;      Neck Exercises: Seated   Neck Retraction 10 reps;3 secs    Other Seated Exercise --      Neck Exercises: Supine   Other Supine Exercise supine chin nod 10x 2      Manual Therapy   Manual therapy comments skilled  palpation and monitoring of soft tissue with dry needling    Joint Mobilization Cervical  PAs, grade III; R first rib inf mob grade II    Soft tissue mobilization DTM and TPR R levator and UT.    Passive ROM --    Manual Traction 10 sec x 10      Neck Exercises: Stretches   Corner Stretch 3 reps;30 seconds    Chest Stretch --   in doorway   Lower Cervical/Upper Thoracic Stretch --    Other Neck Stretches R scalenes stretch 30s 3x; L SB and R rot              Trigger Point Dry Needling - 07/23/20 0001     Consent Given? Yes    Education Handout Provided Previously provided    Muscles Treated Head and Neck Cervical multifidi    Levator Scapulae Response Palpable increased muscle length   R   Cervical multifidi Response Palpable increased muscle length  R  L3-5                 PT Education - 07/23/20 0933     Education Details Reviewed HEP    Person(s) Educated Patient    Methods Explanation;Demonstration;Tactile cues;Verbal cues;Handout    Comprehension Verbalized understanding;Returned demonstration;Verbal cues required;Tactile cues required;Need further instruction              PT Short Term Goals - 06/30/20 1231       PT SHORT TERM GOAL #1   Title Pt to be independent with initial HEP    Time 2    Period Weeks    Status New    Target Date 07/14/20               PT Long Term Goals - 06/30/20 1231       PT LONG TERM GOAL #1   Title Pt to report decreased pain in neck to 0-1/10 with activity and work duties.    Time 6    Period Weeks    Status New    Target Date 08/11/20      PT LONG TERM GOAL #2   Title Pt to demo soft tissue limitations in cervical region to be WNL, for decreased pain and improved ROM.    Time 6    Period Weeks    Status New    Target Date 08/11/20      PT LONG TERM GOAL #3   Title Pt to demo improved ROM of c-spine to be WNL and pain free, to improve ability for IADLs, and driving.    Time 6    Period Weeks     Status New    Target Date 08/11/20      PT LONG TERM GOAL #4   Title Pt to be independent with final HEP    Time 6    Period Weeks    Status New    Target Date 08/11/20                   Plan - 07/23/20 0945     Clinical Impression Statement Pt with mild improvments in pain levels. She continues to have pain with active extension, but other motions, rotation and SB are improved. Most pain located in R levator region, up into R side of c-spine. Manual and dry needling done today to improve. Added light strengthening for scapular muscles, and discussed posture for computer work, with relaxing R arm/shoulder at desk for decreased muscle tension. R hand/arm numbness seems to be consistent with thoracic outlet, and symptoms of numbness/tingling several times throughout session as well as + roos test today. WIll continue ther ex and manual to improve this as well. Pt to benefit from continued care for improving pain, ROM, muscle tension and postural strength.    Personal Factors and Comorbidities Time since onset of injury/illness/exacerbation    Examination-Activity Limitations Carry;Lift;Locomotion Level    Examination-Participation Restrictions Meal Prep;Cleaning;Occupation;Community Activity;Driving;[facility]    Stability/Clinical Decision Making Stable/Uncomplicated    Rehab Potential Good    PT Frequency 2x / week    PT Duration 6 weeks    PT Treatment/Interventions ADLs/Self Care Home Management;Cryotherapy;Electrical Stimulation;DME Instruction;Ultrasound;Traction;Moist Heat;Iontophoresis 4mg /ml Dexamethasone;Functional mobility training;Therapeutic activities;Therapeutic exercise;Neuromuscular re-education;Manual techniques;Patient/family education;Passive range of motion;Dry needling;Taping;Joint Manipulations;Spinal Manipulations;Vasopneumatic Device    PT Next Visit Plan review scalene stretch, chin tuck, possibly retry first rib mob    PT Home Exercise Plan [facility]     Consulted and Agree  with Plan of Care Patient             Patient will benefit from skilled therapeutic intervention in order to improve the following deficits and impairments:  Decreased range of motion, Impaired UE functional use, Increased muscle spasms, Decreased activity tolerance, Pain, Hypomobility, Impaired flexibility, Improper body mechanics, Decreased mobility, Decreased strength  Visit Diagnosis: Cervicalgia  Other muscle spasm     Problem List Patient Active Problem List   Diagnosis Date Noted   Granuloma annulare 06/10/2017   Melanoma of skin ([place]) 06/10/2017   Ankle fracture, right 07/28/2015   Primary hypothyroidism 07/28/2015   Restless legs syndrome (RLS) 07/28/2015   Extrinsic asthma 07/28/2015    Lyndee Hensen, PT, DPT 9:54 AM  07/23/20    [facility] [place], [place], [address] Phone: [phone]   Fax:  [phone]  Name: Ashley Murphy MRN: [id number] Date of Birth: May 01, 1965

## 2020-07-29 ENCOUNTER — Ambulatory Visit (INDEPENDENT_AMBULATORY_CARE_PROVIDER_SITE_OTHER): Payer: BC Managed Care – PPO | Admitting: Physical Therapy

## 2020-07-29 ENCOUNTER — Other Ambulatory Visit: Payer: Self-pay

## 2020-07-29 DIAGNOSIS — M62838 Other muscle spasm: Secondary | ICD-10-CM | POA: Diagnosis not present

## 2020-07-29 DIAGNOSIS — M542 Cervicalgia: Secondary | ICD-10-CM | POA: Diagnosis not present

## 2020-07-30 ENCOUNTER — Encounter: Payer: Self-pay | Admitting: Physical Therapy

## 2020-07-30 NOTE — Therapy (Addendum)
[facility] [address] [place], [place], [address] Phone: [phone]   Fax:  [phone]  Physical Therapy Treatment  Patient Details  Name: Ashley Murphy MRN: [id number] Date of Birth: 09-08-1965 Referring Provider (PT): Orma Flaming   Encounter Date: 07/29/2020   PT End of Session - 07/29/20 1624     Visit Number 7    Number of Visits 12    Date for PT Re-Evaluation 08/08/20    Authorization Type BCBS    PT Start Time 1605    PT Stop Time 1645    PT Time Calculation (min) 40 min    Activity Tolerance Patient tolerated treatment well;Patient limited by pain    Behavior During Therapy [facility] for tasks assessed/performed             Past Medical History:  Diagnosis Date   Anemia    Anxiety    Asthma    Thyroid disease    hypothyroid   Urinary incontinence    not severe    Past Surgical History:  Procedure Laterality Date   ANKLE ARTHROSCOPY WITH RECONSTRUCTION Right    CESAREAN SECTION      There were no vitals filed for this visit.   Subjective Assessment - 07/30/20 1246     Subjective Pt reports continued pain, minimal improvements. She does think muscle tension in R shoulder blade is better, but she is still having signfiicant pain in neck, pain with looking up, and much difficulty getting through her work day due to discomfort.    Currently in Pain? Yes    Pain Score 5     Pain Location Neck    Pain Orientation Right;Left    Pain Descriptors / Indicators Aching    Pain Type Chronic pain    Pain Onset More than a month ago    Pain Frequency Intermittent                               [facility] Adult PT Treatment/Exercise - 07/30/20 0001       Neck Exercises: Theraband   Rows 20 reps;Green    Shoulder External Rotation 15 reps    Shoulder External Rotation Limitations YTB      Neck Exercises: Supine   Other Supine Exercise reviewed for HEP      Manual Therapy   Joint  Mobilization Cervical PAs, grade III;    Soft tissue mobilization STM, TPR to bil cervical paraspinals and SO. SOR,    Passive ROM LAD R shoulder    Manual Traction 10 sec x 10      Neck Exercises: Stretches   Corner Stretch 3 reps;30 seconds                    PT Education - 07/30/20 1247     Education Details Reviewed HEP, discussed referral to Spine MD.    Person(s) Educated Patient    Methods Explanation;Demonstration;Tactile cues;Verbal cues;Handout    Comprehension Verbalized understanding;Returned demonstration;Verbal cues required;Tactile cues required              PT Short Term Goals - 07/30/20 1248       PT SHORT TERM GOAL #1   Title Pt to be independent with initial HEP    Time 2    Period Weeks    Status Achieved    Target Date 07/14/20  PT Long Term Goals - 07/30/20 1248       PT LONG TERM GOAL #1   Title Pt to report decreased pain in neck to 0-1/10 with activity and work duties.    Time 6    Period Weeks    Status On-going      PT LONG TERM GOAL #2   Title Pt to demo soft tissue limitations in cervical region to be WNL, for decreased pain and improved ROM.    Time 6    Period Weeks    Status Partially Met      PT LONG TERM GOAL #3   Title Pt to demo improved ROM of c-spine to be WNL and pain free, to improve ability for IADLs, and driving.    Time 6    Period Weeks    Status On-going      PT LONG TERM GOAL #4   Title Pt to be independent with final HEP    Time 6    Period Weeks    Status Achieved                   Plan - 07/30/20 1249     Clinical Impression Statement Pt has been seen for 7 visits. She continues to have signfiicant pain in neck that is limiting ability for work duties and functional activities. She has had improvments with Muscle tension in  R levator region as well as area of 1st rib. She continues to have much pain and difficulty with extension ROM, as well as having frequent  tingling into R hand and arm. She recently notes tingling in LEs as well. Pt has not had significant pain relief from sessions, and will benefit from referral to Spine specialist for further evaluation. Pt in agreement with plan. Pt may continue PT in the next couple weeks until appt,  if needed,  to continue to relieve tension, pain and for HEP education.    Personal Factors and Comorbidities Time since onset of injury/illness/exacerbation    Examination-Activity Limitations Carry;Lift;Locomotion Level    Examination-Participation Restrictions Meal Prep;Cleaning;Occupation;Community Activity;Driving;[facility]    Stability/Clinical Decision Making Stable/Uncomplicated    Rehab Potential Good    PT Frequency 2x / week    PT Duration 6 weeks    PT Treatment/Interventions ADLs/Self Care Home Management;Cryotherapy;Electrical Stimulation;DME Instruction;Ultrasound;Traction;Moist Heat;Iontophoresis 4mg /ml Dexamethasone;Functional mobility training;Therapeutic activities;Therapeutic exercise;Neuromuscular re-education;Manual techniques;Patient/family education;Passive range of motion;Dry needling;Taping;Joint Manipulations;Spinal Manipulations;Vasopneumatic Device    PT Next Visit Plan review scalene stretch, chin tuck, possibly retry first rib mob    PT Home Exercise Plan [id number]    Consulted and Agree with Plan of Care Patient             Patient will benefit from skilled therapeutic intervention in order to improve the following deficits and impairments:  Decreased range of motion, Impaired UE functional use, Increased muscle spasms, Decreased activity tolerance, Pain, Hypomobility, Impaired flexibility, Improper body mechanics, Decreased mobility, Decreased strength  Visit Diagnosis: Cervicalgia  Other muscle spasm     Problem List Patient Active Problem List   Diagnosis Date Noted   Granuloma annulare 06/10/2017   Melanoma of skin ([place]) 06/10/2017   Ankle fracture, right  07/28/2015   Primary hypothyroidism 07/28/2015   Restless legs syndrome (RLS) 07/28/2015   Extrinsic asthma 07/28/2015   Lyndee Hensen, PT, DPT 12:56 PM  07/30/20    [facility] [address] [place], [place], [address] Phone: [phone]   Fax:  [phone]  Name: Ashley Murphy MRN: [id number] Date of Birth: 11-Jun-1965  PHYSICAL THERAPY DISCHARGE SUMMARY  Visits from Start of Care: 7 Plan: Patient agrees to discharge.  Patient goals were not met. Patient is being discharged due to referred to ortho   Lyndee Hensen, PT, DPT 12:25 PM  03/18/21

## 2020-07-31 ENCOUNTER — Encounter: Payer: BC Managed Care – PPO | Admitting: Physical Therapy

## 2020-08-04 DIAGNOSIS — M5412 Radiculopathy, cervical region: Secondary | ICD-10-CM | POA: Diagnosis not present

## 2020-08-05 ENCOUNTER — Encounter: Payer: Self-pay | Admitting: Physician Assistant

## 2020-08-05 ENCOUNTER — Other Ambulatory Visit: Payer: Self-pay

## 2020-08-05 ENCOUNTER — Encounter: Payer: BC Managed Care – PPO | Admitting: Physical Therapy

## 2020-08-05 ENCOUNTER — Ambulatory Visit (INDEPENDENT_AMBULATORY_CARE_PROVIDER_SITE_OTHER): Payer: BC Managed Care – PPO | Admitting: Physician Assistant

## 2020-08-05 VITALS — BP 113/77 | HR 81 | Temp 97.6°F | Ht 61.5 in | Wt 124.4 lb

## 2020-08-05 DIAGNOSIS — D485 Neoplasm of uncertain behavior of skin: Secondary | ICD-10-CM | POA: Diagnosis not present

## 2020-08-05 DIAGNOSIS — C439 Malignant melanoma of skin, unspecified: Secondary | ICD-10-CM

## 2020-08-05 NOTE — Progress Notes (Signed)
Acute Office Visit  Subjective:    Patient ID: Ashley Murphy, female    DOB: 1965/07/04, 55 y.o.   MRN: [id number]  Chief Complaint  Patient presents with   Rash    HPI Patient is in today for concern for new skin areas on upper back.  Husband noticed darker spots. Painless, they do not itch. Irregular border. Hx of melanoma RLE in 2017.   Past Medical History:  Diagnosis Date   Anemia    Anxiety    Asthma    Thyroid disease    hypothyroid   Urinary incontinence    not severe    Past Surgical History:  Procedure Laterality Date   ANKLE ARTHROSCOPY WITH RECONSTRUCTION Right    CESAREAN SECTION      Family History  Problem Relation Age of Onset   Arthritis Mother    Cancer Mother 39       likely from reproductive organs    Arthritis Father    Heart disease Father    Hypertension Father    Hyperlipidemia Father    Cancer Paternal Grandfather    Colon cancer Paternal Grandfather    Alzheimer's disease Paternal Grandmother    Esophageal cancer Neg Hx    Rectal cancer Neg Hx    Stomach cancer Neg Hx     Social History   Socioeconomic History   Marital status: Married    Spouse name: Not on file   Number of children: Not on file   Years of education: Not on file   Highest education level: Not on file  Occupational History   Not on file  Tobacco Use   Smoking status: Former    Pack years: 0.00   Smokeless tobacco: Never   Tobacco comments:    Quit 27 years ago  Substance and Sexual Activity   Alcohol use: Yes    Alcohol/week: 0.0 standard drinks    Comment: socially   Drug use: No   Sexual activity: Not on file  Other Topics Concern   Not on file  Social History Narrative   Not on file   Social Determinants of Health   Financial Resource Strain: Not on file  Food Insecurity: Not on file  Transportation Needs: Not on file  Physical Activity: Not on file  Stress: Not on file  Social Connections: Not on file  Intimate Partner  Violence: Not on file    Outpatient Medications Prior to Visit  Medication Sig Dispense Refill   estradiol (VIVELLE-DOT) 0.0375 MG/24HR PLACE 1 PATCH ON THE SKIN TWICE A WEEK 24 patch 3   levothyroxine (SYNTHROID) 88 MCG tablet Take 1 tablet (88 mcg total) by mouth daily before breakfast. 90 tablet 3   methocarbamol (ROBAXIN) 500 MG tablet Take 1 tablet (500 mg total) by mouth 4 (four) times daily. 60 tablet 1   Multiple Vitamins-Minerals (MULTIVITAMIN PO) Take by mouth daily.     naproxen sodium (ANAPROX DS) 550 MG tablet Take 1 tablet (550 mg total) by mouth 2 (two) times daily with a meal. Use prn cramps 30 tablet 1   No facility-administered medications prior to visit.    No Known Allergies  Review of Systems REFER TO HPI FOR PERTINENT POSITIVES AND NEGATIVES     Objective:    Physical Exam Skin:    Comments: Right upper-mid back there are two hazy <5 mm darker flat lesions with irregular borders    BP 113/77   Pulse 81   Temp 97.6 F (36.4  C)   Ht 5' 1.5" (1.562 m)   Wt 124 lb 6.1 oz (56.4 kg)   SpO2 97%   BMI 23.12 kg/m  Wt Readings from Last 3 Encounters:  08/05/20 124 lb 6.1 oz (56.4 kg)  06/16/20 125 lb 12.8 oz (57.1 kg)  01/17/20 122 lb 6.4 oz (55.5 kg)    Health Maintenance Due  Topic Date Due   Pneumococcal Vaccine 54-91 Years old (1 - PCV) Never done   Zoster Vaccines- Shingrix (1 of 2) Never done   COVID-19 Vaccine (3 - Pfizer risk series) 07/10/2019    There are no preventive care reminders to display for this patient.   Lab Results  Component Value Date   TSH 1.85 06/16/2020   Lab Results  Component Value Date   WBC 5.8 06/16/2020   HGB 13.4 06/16/2020   HCT 38.6 06/16/2020   MCV 93.6 06/16/2020   PLT 206.0 06/16/2020   Lab Results  Component Value Date   NA 139 06/16/2020   K 3.8 06/16/2020   CO2 27 06/16/2020   GLUCOSE 80 06/16/2020   BUN 17 06/16/2020   CREATININE 0.85 06/16/2020   BILITOT 0.6 06/16/2020   ALKPHOS 45  06/16/2020   AST 19 06/16/2020   ALT 16 06/16/2020   PROT 7.2 06/16/2020   ALBUMIN 4.5 06/16/2020   CALCIUM 9.2 06/16/2020   GFR 76.56 06/16/2020   Lab Results  Component Value Date   CHOL 188 06/16/2020   Lab Results  Component Value Date   HDL 55.00 06/16/2020   Lab Results  Component Value Date   LDLCALC 113 (H) 06/16/2020   Lab Results  Component Value Date   TRIG 101.0 06/16/2020   Lab Results  Component Value Date   CHOLHDL 3 06/16/2020   No results found for: HGBA1C     Assessment & Plan:   Problem List Items Addressed This Visit   None  1. Melanoma of skin ([place]) 2. Neoplasm of uncertain behavior of skin of back Two areas of concern - recommend punch biopsy to remove and evaluate, especially with risk factor of prior melanoma. She cannot see dermatology until November, so I will remove in the office at separate appointment.    Estuardo Frisbee M Pedrohenrique [place], PA-C

## 2020-08-05 NOTE — Patient Instructions (Signed)
Schedule next Friday for punch biopsies of back. We will also do full skin exam that day.

## 2020-08-12 ENCOUNTER — Encounter: Payer: BC Managed Care – PPO | Admitting: Physical Therapy

## 2020-08-12 DIAGNOSIS — M542 Cervicalgia: Secondary | ICD-10-CM | POA: Diagnosis not present

## 2020-08-13 ENCOUNTER — Ambulatory Visit (INDEPENDENT_AMBULATORY_CARE_PROVIDER_SITE_OTHER): Payer: BC Managed Care – PPO | Admitting: Physician Assistant

## 2020-08-13 ENCOUNTER — Other Ambulatory Visit: Payer: Self-pay

## 2020-08-13 VITALS — BP 105/69 | HR 84 | Temp 97.2°F | Ht 61.5 in | Wt 125.2 lb

## 2020-08-13 DIAGNOSIS — D485 Neoplasm of uncertain behavior of skin: Secondary | ICD-10-CM | POA: Diagnosis not present

## 2020-08-13 NOTE — Progress Notes (Signed)
Acute Office Visit  Subjective:    Patient ID: Ashley Murphy, female    DOB: 01-Jun-1965, 56 y.o.   MRN: [id number]  Chief Complaint  Patient presents with   Melanoma    HPI Patient is in today for punch biopsy procedure upper back.   Past Medical History:  Diagnosis Date   Anemia    Anxiety    Asthma    Thyroid disease    hypothyroid   Urinary incontinence    not severe    Past Surgical History:  Procedure Laterality Date   ANKLE ARTHROSCOPY WITH RECONSTRUCTION Right    CESAREAN SECTION      Family History  Problem Relation Age of Onset   Arthritis Mother    Cancer Mother 67       likely from reproductive organs    Arthritis Father    Heart disease Father    Hypertension Father    Hyperlipidemia Father    Cancer Paternal Grandfather    Colon cancer Paternal Grandfather    Alzheimer's disease Paternal Grandmother    Esophageal cancer Neg Hx    Rectal cancer Neg Hx    Stomach cancer Neg Hx     Social History   Socioeconomic History   Marital status: Married    Spouse name: Not on file   Number of children: Not on file   Years of education: Not on file   Highest education level: Not on file  Occupational History   Not on file  Tobacco Use   Smoking status: Former    Pack years: 0.00   Smokeless tobacco: Never   Tobacco comments:    Quit 27 years ago  Substance and Sexual Activity   Alcohol use: Yes    Alcohol/week: 0.0 standard drinks    Comment: socially   Drug use: No   Sexual activity: Not on file  Other Topics Concern   Not on file  Social History Narrative   Not on file   Social Determinants of Health   Financial Resource Strain: Not on file  Food Insecurity: Not on file  Transportation Needs: Not on file  Physical Activity: Not on file  Stress: Not on file  Social Connections: Not on file  Intimate Partner Violence: Not on file    Outpatient Medications Prior to Visit  Medication Sig Dispense Refill   estradiol  (VIVELLE-DOT) 0.0375 MG/24HR PLACE 1 PATCH ON THE SKIN TWICE A WEEK 24 patch 3   levothyroxine (SYNTHROID) 88 MCG tablet Take 1 tablet (88 mcg total) by mouth daily before breakfast. 90 tablet 3   methocarbamol (ROBAXIN) 500 MG tablet Take 1 tablet (500 mg total) by mouth 4 (four) times daily. 60 tablet 1   Multiple Vitamins-Minerals (MULTIVITAMIN PO) Take by mouth daily.     naproxen sodium (ANAPROX DS) 550 MG tablet Take 1 tablet (550 mg total) by mouth 2 (two) times daily with a meal. Use prn cramps 30 tablet 1   No facility-administered medications prior to visit.    No Known Allergies  Review of Systems +hx of melanoma, skin lesion change upper back    Objective:    Physical Exam Skin:    Comments: Right upper-mid back there are two hazy <5 mm darker flat lesions with irregular borders *Addendum - one lesion of concern today and therefore will only biopsy in one area    BP 105/69   Pulse 84   Temp (!) 97.2 F (36.2 C)   Ht 5' 1.5" (  1.562 m)   Wt 125 lb 4 oz (56.8 kg)   SpO2 100%   BMI 23.28 kg/m  Wt Readings from Last 3 Encounters:  08/13/20 125 lb 4 oz (56.8 kg)  08/05/20 124 lb 6.1 oz (56.4 kg)  06/16/20 125 lb 12.8 oz (57.1 kg)    Health Maintenance Due  Topic Date Due   Pneumococcal Vaccine 32-56 Years old (1 - PCV) Never done   Zoster Vaccines- Shingrix (1 of 2) Never done   COVID-19 Vaccine (3 - Pfizer risk series) 07/10/2019    There are no preventive care reminders to display for this patient.   Lab Results  Component Value Date   TSH 1.85 06/16/2020   Lab Results  Component Value Date   WBC 5.8 06/16/2020   HGB 13.4 06/16/2020   HCT 38.6 06/16/2020   MCV 93.6 06/16/2020   PLT 206.0 06/16/2020   Lab Results  Component Value Date   NA 139 06/16/2020   K 3.8 06/16/2020   CO2 27 06/16/2020   GLUCOSE 80 06/16/2020   BUN 17 06/16/2020   CREATININE 0.85 06/16/2020   BILITOT 0.6 06/16/2020   ALKPHOS 45 06/16/2020   AST 19 06/16/2020   ALT 16  06/16/2020   PROT 7.2 06/16/2020   ALBUMIN 4.5 06/16/2020   CALCIUM 9.2 06/16/2020   GFR 76.56 06/16/2020   Lab Results  Component Value Date   CHOL 188 06/16/2020   Lab Results  Component Value Date   HDL 55.00 06/16/2020   Lab Results  Component Value Date   LDLCALC 113 (H) 06/16/2020   Lab Results  Component Value Date   TRIG 101.0 06/16/2020   Lab Results  Component Value Date   CHOLHDL 3 06/16/2020   No results found for: HGBA1C     Assessment & Plan:   Problem List Items Addressed This Visit   None Visit Diagnoses     Neoplasm of uncertain behavior of skin of back    -  Primary      1. Neoplasm of uncertain behavior of skin of back Procedure explained and consent obtained. Area cleaned in usual manner. Local anesthesia with 2 cc of Lidocaine 1% with Epi, using a 30 gauge needle. 3 mm punch biopsy used to remove the lesion. Hemostasis with Drysol. Band-Aid with Vaseline applied. Patient tolerated procedure well. Aftercare instructions provided. Lesion sent for pathology.   Complete skin exam also performed in office today. Very small pinpoint darkened lesion right lower leg, no irregular borders, size <1 mm, not raised. Will watch and monitor at this time. No other areas of concern identified today.   Stella Bortle M Kaelynne Christley, PA-C

## 2020-08-13 NOTE — Patient Instructions (Addendum)
Please call if any concerns! F/up with dermatology as scheduled.   I will call with pathology report. Continue to replace Bandage daily and apply Vaseline to area. Gently wash with soap and water. Call if any signs of infection including redness, discharge, pain, or fever.

## 2020-08-15 ENCOUNTER — Ambulatory Visit: Payer: BC Managed Care – PPO | Admitting: Physician Assistant

## 2020-08-16 DIAGNOSIS — Z1231 Encounter for screening mammogram for malignant neoplasm of breast: Secondary | ICD-10-CM | POA: Diagnosis not present

## 2020-08-16 LAB — ANATOMIC PATHOLOGY REPORT

## 2020-08-19 DIAGNOSIS — M5412 Radiculopathy, cervical region: Secondary | ICD-10-CM | POA: Diagnosis not present

## 2020-08-19 DIAGNOSIS — M47812 Spondylosis without myelopathy or radiculopathy, cervical region: Secondary | ICD-10-CM | POA: Diagnosis not present

## 2020-08-28 DIAGNOSIS — R030 Elevated blood-pressure reading, without diagnosis of hypertension: Secondary | ICD-10-CM | POA: Diagnosis not present

## 2020-08-28 DIAGNOSIS — M5412 Radiculopathy, cervical region: Secondary | ICD-10-CM | POA: Diagnosis not present

## 2020-08-28 DIAGNOSIS — M47812 Spondylosis without myelopathy or radiculopathy, cervical region: Secondary | ICD-10-CM | POA: Diagnosis not present

## 2020-08-28 DIAGNOSIS — M5416 Radiculopathy, lumbar region: Secondary | ICD-10-CM | POA: Diagnosis not present

## 2020-09-02 NOTE — Progress Notes (Signed)
55 y.o. G2P0202 Married White or Caucasian Not Hispanic or Latino female here for annual exam. Patient would like to know what other alternatives to the patch would be.   She has a mirena IUD, inserted in 7/21 for contraception and severe dysmenorrhea. No cycles since the IUD was inserted. Sexually active. She was started on ERT last year for vasomotor symptoms. Currently on the 0.0375 g patch. No vasomotor symptoms or vaginal dryness on the patch.    H/O melanoma, treated in 2017. Seen at the Dermatologist, needs a new one. She will schedule.   No LMP recorded. (Menstrual status: IUD).          Sexually active: Yes.    The current method of family planning is IUD.    Exercising: No.  The patient does not participate in regular exercise at present. Smoker:  no  Health Maintenance: Pap: 7/9/21WNL Hr HPV neg  05/21/2016 Neg HPV Neg  History of abnormal Pap:  no MMG:08/16/20  Normal (scanned)  BMD:   none  Colonoscopy: 01/28/16 normal f/u 10 years  TDaP:  2016 Gardasil: NA   reports that she has quit smoking. She has never used smokeless tobacco. She reports current alcohol use. She reports that she does not use drugs. Works as a Government social research officer. 2 biological sons (both local) and one stepson that she has helped raise (in [place]). 86.81 year old grandson here. 4 grandchildren in [place].    Past Medical History:  Diagnosis Date   Anemia    Anxiety    Asthma    Thyroid disease    hypothyroid   Urinary incontinence    not severe    Past Surgical History:  Procedure Laterality Date   ANKLE ARTHROSCOPY WITH RECONSTRUCTION Right    CESAREAN SECTION      Current Outpatient Medications  Medication Sig Dispense Refill   estradiol (VIVELLE-DOT) 0.0375 MG/24HR PLACE 1 PATCH ON THE SKIN TWICE A WEEK 24 patch 3   levothyroxine (SYNTHROID) 88 MCG tablet Take 1 tablet (88 mcg total) by mouth daily before breakfast. 90 tablet 3   Multiple Vitamins-Minerals (MULTIVITAMIN PO) Take by mouth  daily.     naproxen sodium (ANAPROX DS) 550 MG tablet Take 1 tablet (550 mg total) by mouth 2 (two) times daily with a meal. Use prn cramps 30 tablet 1   diazepam (VALIUM) 5 MG tablet SMARTSIG:1-2 Tablet(s) By Mouth     No current facility-administered medications for this visit.    Family History  Problem Relation Age of Onset   Arthritis Mother    Cancer Mother 27       likely from reproductive organs    Arthritis Father    Heart disease Father    Hypertension Father    Hyperlipidemia Father    Cancer Paternal Grandfather    Colon cancer Paternal Grandfather    Alzheimer's disease Paternal Grandmother    Esophageal cancer Neg Hx    Rectal cancer Neg Hx    Stomach cancer Neg Hx   Mom with stage 4 cancer, diagnosed at 19. Primary unknown. Died at 39. It was thought to be from her reproductive system. Not aware of any genetic testing.   Review of Systems  All other systems reviewed and are negative. H/O hemorrhoids, occasionally flare. Recently had a flare.   Exam:   BP (!) 160/84   Pulse 93   Ht 5' 1.5" (1.562 m)   Wt 123 lb 6.4 oz (56 kg)   SpO2 100%  BMI 22.94 kg/m   Weight change: '@WEIGHTCHANGE'$ @ Height:   Height: 5' 1.5" (156.2 cm)  Ht Readings from Last 3 Encounters:  09/04/20 5' 1.5" (1.562 m)  08/13/20 5' 1.5" (1.562 m)  08/05/20 5' 1.5" (1.562 m)    General appearance: alert, cooperative and appears stated age Head: Normocephalic, without obvious abnormality, atraumatic Neck: no adenopathy, supple, symmetrical, trachea midline and thyroid normal to inspection and palpation Lungs: clear to auscultation bilaterally Cardiovascular: regular rate and rhythm Breasts: normal appearance, no masses or tenderness Abdomen: soft, non-tender; non distended,  no masses,  no organomegaly Extremities: extremities normal, atraumatic, no cyanosis or edema Skin: Skin color, texture, turgor normal. No rashes or lesions Lymph nodes: Cervical, supraclavicular, and axillary nodes  normal. No abnormal inguinal nodes palpated Neurologic: Grossly normal   Pelvic: External genitalia:  no lesions              Urethra:  normal appearing urethra with no masses, tenderness or lesions              Bartholins and Skenes: normal                 Vagina: normal appearing vagina with normal color and discharge, no lesions              Cervix: no lesions and IUD string 3-4 cm               Bimanual Exam:  Uterus:  normal size, contour, position, consistency, mobility, non-tender              Adnexa: no mass, fullness, tenderness               Rectovaginal: Confirms               Anus:  normal sphincter tone, no lesions  Gae Dry chaperoned for the exam.  1. Well woman exam Discussed breast self exam Discussed calcium and vit D intake No pap this year Mammogram UTD Colonoscopy UTD Labs with primary  2. IUD check up Doing well  3. Hormone replacement therapy Doing well, wants to continue - estradiol (VIVELLE-DOT) 0.0375 MG/24HR; PLACE 1 PATCH ON THE SKIN TWICE A WEEK  Dispense: 24 patch; Refill: 3  4. Family history of cancer Mom with diagnosed with stage 4 cancer, thought to be reproductive primary - Ambulatory referral to Genetics  5. History of hemorrhoids Intermittent flares - hydrocortisone (ANUSOL-HC) 2.5 % rectal cream; Place rectally 2 (two) times daily.  Dispense: 30 g; Refill: 1  6. Elevated BP without diagnosis of hypertension Repeat BP 158/78, increased stress this am She will f/u with her primary

## 2020-09-04 ENCOUNTER — Ambulatory Visit (INDEPENDENT_AMBULATORY_CARE_PROVIDER_SITE_OTHER): Payer: BC Managed Care – PPO | Admitting: Obstetrics and Gynecology

## 2020-09-04 ENCOUNTER — Other Ambulatory Visit: Payer: Self-pay

## 2020-09-04 ENCOUNTER — Encounter: Payer: Self-pay | Admitting: Obstetrics and Gynecology

## 2020-09-04 VITALS — BP 158/78 | HR 93 | Ht 61.5 in | Wt 123.4 lb

## 2020-09-04 DIAGNOSIS — Z01419 Encounter for gynecological examination (general) (routine) without abnormal findings: Secondary | ICD-10-CM

## 2020-09-04 DIAGNOSIS — R030 Elevated blood-pressure reading, without diagnosis of hypertension: Secondary | ICD-10-CM

## 2020-09-04 DIAGNOSIS — Z79890 Hormone replacement therapy (postmenopausal): Secondary | ICD-10-CM

## 2020-09-04 DIAGNOSIS — Z809 Family history of malignant neoplasm, unspecified: Secondary | ICD-10-CM | POA: Diagnosis not present

## 2020-09-04 DIAGNOSIS — Z30431 Encounter for routine checking of intrauterine contraceptive device: Secondary | ICD-10-CM

## 2020-09-04 DIAGNOSIS — Z8719 Personal history of other diseases of the digestive system: Secondary | ICD-10-CM

## 2020-09-04 MED ORDER — HYDROCORTISONE (PERIANAL) 2.5 % EX CREA: TOPICAL_CREAM | Freq: Two times a day (BID) | CUTANEOUS | 1 refills | Status: DC

## 2020-09-04 MED ORDER — ESTRADIOL 0.0375 MG/24HR TD PTTW: MEDICATED_PATCH | TRANSDERMAL | 3 refills | Status: DC

## 2020-09-04 NOTE — Patient Instructions (Signed)

## 2020-09-10 ENCOUNTER — Telehealth: Payer: Self-pay | Admitting: Licensed Clinical Social Worker

## 2020-09-10 NOTE — Telephone Encounter (Signed)
Received a new hem referral from Dr. Talbert Nan for fhx of cancer. Ashley Murphy has been cld and scheduled to see [name] on 8/15 at 10am. Pt aware to arrive 15 minutes early.

## 2020-09-22 ENCOUNTER — Inpatient Hospital Stay: Payer: BC Managed Care – PPO

## 2020-09-22 ENCOUNTER — Encounter: Payer: Self-pay | Admitting: Licensed Clinical Social Worker

## 2020-09-22 ENCOUNTER — Other Ambulatory Visit: Payer: Self-pay

## 2020-09-22 ENCOUNTER — Inpatient Hospital Stay: Payer: BC Managed Care – PPO | Attending: Obstetrics and Gynecology | Admitting: Licensed Clinical Social Worker

## 2020-09-22 DIAGNOSIS — C439 Malignant melanoma of skin, unspecified: Secondary | ICD-10-CM

## 2020-09-22 DIAGNOSIS — Z800 Family history of malignant neoplasm of digestive organs: Secondary | ICD-10-CM | POA: Insufficient documentation

## 2020-09-22 DIAGNOSIS — Z808 Family history of malignant neoplasm of other organs or systems: Secondary | ICD-10-CM | POA: Insufficient documentation

## 2020-09-22 LAB — GENETIC SCREENING ORDER

## 2020-09-22 NOTE — Progress Notes (Signed)
REFERRING PROVIDER: Salvadore Dom, [address],  [place] [address]  PRIMARY PROVIDER:  Orma Flaming, MD  PRIMARY REASON FOR VISIT:  1. Melanoma of skin ([facility])   2. Family history of melanoma   3. Family history of colon cancer     HISTORY OF PRESENT ILLNESS:   Ashley Murphy, a 55 y.o. female, was seen for a [facility] cancer genetics consultation at the request of Dr. Talbert Nan due to a family history of cancer.  Ashley Murphy presents to clinic today to discuss the possibility of a hereditary predisposition to cancer, genetic testing, and to further clarify her future cancer risks, as well as potential cancer risks for family members.   Ashley Murphy is a 55 y.o. female with no personal history of cancer other than a melanoma that was removed from her shin.   CANCER HISTORY:  Oncology History   No history exists.     RISK FACTORS:  Menarche was at age 78.  First live birth at age 47.  OCP use for approximately  30  years.  Ovaries intact: yes.  Hysterectomy: no.  Menopausal status: postmenopausal.  HRT use: 1 years. Colonoscopy: yes; normal. Mammogram within the last year: yes. Number of breast biopsies: 0. Up to date with pelvic exams: yes.   Past Medical History:  Diagnosis Date   Anemia    Anxiety    Asthma    Family history of colon cancer    Family history of melanoma    Thyroid disease    hypothyroid   Urinary incontinence    not severe    Past Surgical History:  Procedure Laterality Date   ANKLE ARTHROSCOPY WITH RECONSTRUCTION Right    CESAREAN SECTION      Social History   Socioeconomic History   Marital status: Married    Spouse name: Not on file   Number of children: Not on file   Years of education: Not on file   Highest education level: Not on file  Occupational History   Not on file  Tobacco Use   Smoking status: Former   Smokeless tobacco: Never   Tobacco comments:    Quit 27 years ago  Substance and  Sexual Activity   Alcohol use: Yes    Alcohol/week: 0.0 standard drinks    Comment: socially   Drug use: No   Sexual activity: Not on file  Other Topics Concern   Not on file  Social History Narrative   Not on file   Social Determinants of Health   Financial Resource Strain: Not on file  Food Insecurity: Not on file  Transportation Needs: Not on file  Physical Activity: Not on file  Stress: Not on file  Social Connections: Not on file     FAMILY HISTORY:  We obtained a detailed, 4-generation family history.  Significant diagnoses are listed below: Family History  Problem Relation Age of Onset   Arthritis Mother    Cancer Mother 62       likely from reproductive organs    Arthritis Father    Heart disease Father    Hypertension Father    Hyperlipidemia Father    Melanoma Father    Alzheimer's disease Paternal Grandmother    Cancer Paternal Grandfather    Colon cancer Paternal Grandfather    Breast cancer Maternal Great-grandmother    Esophageal cancer Neg Hx    Rectal cancer Neg Hx    Stomach cancer Neg Hx  Ashley Murphy has 2 sons, 60 and 7, no history of cancer. Ashley Murphy has 1 sister, 71, and 1 brother, 39, no cancers.  Ashley Murphy mother had stage IV cancer diagnosed at 74, unknown primary, but thought to be GYN origin. She passed at 76. Patient had 1 maternal uncle, no cancers. Maternal grandmother died at 20 and her mother had breast cancer in her 35s. Grandfather died at 47.  Ashley Murphy's father had melanoma. He passed at 28. Patient had 2 maternal aunts, 1 uncle, no cancers. No cancers in cousins. Paternal grandmother died under age 55, unsure exact cause but she did have rheumatoid arthritis. Paternal grandfather had colon cancer his 97s, died in his 40s.  Ashley Murphy is unaware of previous family history of genetic testing for hereditary cancer risks. Patient's maternal ancestors are of [place] descent, and paternal ancestors are of [place], [place],  [place] descent. There is no reported Ashkenazi Jewish ancestry. There is no known consanguinity.    GENETIC COUNSELING ASSESSMENT: Ashley Murphy is a 55 y.o. female with a family history which is not particularly suggestive of a hereditary cancer syndrome and predisposition to cancer. We, therefore, discussed and recommended the following at today's visit.   DISCUSSION: We discussed that approximately 5-10% of cancer is hereditary. Risk assessment is difficult since her mother's primary cancer is unknown; if this was ovarian her family history would qualify her for testing but if it was cervical it would be much less concerning for a hereditary cause. About 15-20% of ovarian cancer is hereditary. We discussed that we could still do testing but she would likely have to pay out of pocket as insurance would be unlikely to cover it. We discussed BRCA1/BRCA2, noting that there are many other genes we can look at related to hereditary cancer. Cancers and risks are gene specific.  We discussed that testing is beneficial for several reasons including knowing about cancer risks, identifying potential screening and risk-reduction options that may be appropriate, and to understand if other family members could be at risk for cancer and allow them to undergo genetic testing.   We reviewed the characteristics, features and inheritance patterns of hereditary cancer syndromes. We also discussed genetic testing, including the appropriate family members to test, the process of testing, insurance coverage and turn-around-time for results. We discussed the implications of a negative, positive and/or variant of uncertain significant result. We offered for Ashley Murphy to pursue genetic testing for the [employer] (85) gene panel.   Based on Ashley Murphy's family history of cancer, she does not meet medical criteria for genetic testing. She may have an out of pocket cost.   We discussed that some people do not want to  undergo genetic testing due to fear of genetic discrimination.  A federal law called the Genetic Information Non-Discrimination Act (GINA) of 2008 helps protect individuals against genetic discrimination based on their genetic test results.  It impacts both health insurance and employment.  For health insurance, it protects against increased premiums, being kicked off insurance or being forced to take a test in order to be insured.  For employment it protects against hiring, firing and promoting decisions based on genetic test results.  Health status due to a cancer diagnosis is not protected under GINA.  This law does not protect life insurance, disability insurance, or other types of insurance.   PLAN: After considering the risks, benefits, and limitations, Ashley Murphy decided to have her blood drawn but is still deciding whether or not  she wants testing. She would like more information about whether life insurance would have access to notes about her genetic testing. We will call her back with more information soon. Should she decline testing, we would recommend Ashley Murphy continue to follow the cancer screening guidelines given by her primary healthcare provider.  Ashley Murphy's questions were answered to her satisfaction today. Our contact information was provided should additional questions or concerns arise. Thank you for the referral and allowing [place] to share in the care of your patient.   Faith Rogue, MS, [facility] Genetic Counselor [email or web address] Phone: [phone]  The patient was seen for a total of 35 minutes in face-to-face genetic counseling.  Patient was seen alone. Dr. Grayland Ormond was available for discussion regarding this case.   _______________________________________________________________________ For Office Staff:  Number of people involved in session: 1 Was an Intern/ student involved with case: no

## 2020-09-24 ENCOUNTER — Telehealth: Payer: Self-pay | Admitting: Licensed Clinical Social Worker

## 2020-09-24 NOTE — Telephone Encounter (Signed)
Called Ashley Murphy to discuss whether or not she'd like to proceed with testing. She would like to hold off at this time but will let us know in the future if she'd like [place] to coordinate this for her.

## 2020-09-25 DIAGNOSIS — M5412 Radiculopathy, cervical region: Secondary | ICD-10-CM | POA: Diagnosis not present

## 2020-10-22 ENCOUNTER — Encounter: Payer: Self-pay | Admitting: Physician Assistant

## 2020-10-22 ENCOUNTER — Ambulatory Visit (INDEPENDENT_AMBULATORY_CARE_PROVIDER_SITE_OTHER): Payer: BC Managed Care – PPO | Admitting: Physician Assistant

## 2020-10-22 ENCOUNTER — Other Ambulatory Visit: Payer: Self-pay

## 2020-10-22 VITALS — BP 148/88 | HR 72 | Temp 98.2°F | Ht 61.5 in | Wt 124.2 lb

## 2020-10-22 DIAGNOSIS — R631 Polydipsia: Secondary | ICD-10-CM

## 2020-10-22 DIAGNOSIS — R350 Frequency of micturition: Secondary | ICD-10-CM | POA: Diagnosis not present

## 2020-10-22 DIAGNOSIS — M47812 Spondylosis without myelopathy or radiculopathy, cervical region: Secondary | ICD-10-CM | POA: Diagnosis not present

## 2020-10-22 DIAGNOSIS — R351 Nocturia: Secondary | ICD-10-CM

## 2020-10-22 DIAGNOSIS — E039 Hypothyroidism, unspecified: Secondary | ICD-10-CM

## 2020-10-22 LAB — POC URINALSYSI DIPSTICK (AUTOMATED)
Bilirubin, UA: NEGATIVE
Blood, UA: NEGATIVE
Glucose, UA: NEGATIVE
Ketones, UA: NEGATIVE
Leukocytes, UA: NEGATIVE
Nitrite, UA: NEGATIVE
Protein, UA: NEGATIVE
Spec Grav, UA: >=1.03 — AB (ref 1.010–1.025)
Urobilinogen, UA: 0.2 E.U./dL
pH, UA: 5.5 (ref 5.0–8.0)

## 2020-10-22 NOTE — Patient Instructions (Signed)
Urinalysis normal. Will check urine culture and labs, call with results. Please try to decrease caffeine intake daily and cut off water by 6 or 7 pm at the latest. Consider Ultrasound / further work-up if everything comes back negative & symptoms persist.

## 2020-10-22 NOTE — Progress Notes (Signed)
Acute Office Visit  Subjective:    Patient ID: Ashley Murphy, female    DOB: [birth year], 55 y.o.   MRN: [id number]  Chief Complaint  Patient presents with   Urinary Frequency    HPI Patient is in today for nocturia x 6 weeks. Started about one time per night. Has progressively worsened to about 6 times per night. Last night was only three times. Drinks 2 cups coffee in the mornings and drinks plenty of water throughout the day. No pressure. She does have urgency when she wakes up. No dysuria. No blood. No odor.   Past Medical History:  Diagnosis Date   Anemia    Anxiety    Asthma    Family history of colon cancer    Family history of melanoma    Thyroid disease    hypothyroid   Urinary incontinence    not severe    Past Surgical History:  Procedure Laterality Date   ANKLE ARTHROSCOPY WITH RECONSTRUCTION Right    CESAREAN SECTION      Family History  Problem Relation Age of Onset   Arthritis Mother    Cancer Mother 63       likely from reproductive organs    Arthritis Father    Heart disease Father    Hypertension Father    Hyperlipidemia Father    Melanoma Father    Alzheimer's disease Paternal Grandmother    Cancer Paternal Grandfather    Colon cancer Paternal Grandfather    Breast cancer Maternal Great-grandmother    Esophageal cancer Neg Hx    Rectal cancer Neg Hx    Stomach cancer Neg Hx     Social History   Socioeconomic History   Marital status: Married    Spouse name: Not on file   Number of children: Not on file   Years of education: Not on file   Highest education level: Not on file  Occupational History   Not on file  Tobacco Use   Smoking status: Former   Smokeless tobacco: Never   Tobacco comments:    Quit 27 years ago  Substance and Sexual Activity   Alcohol use: Yes    Alcohol/week: 0.0 standard drinks    Comment: socially   Drug use: No   Sexual activity: Not on file  Other Topics Concern   Not on file  Social History  Narrative   Not on file   Social Determinants of Health   Financial Resource Strain: Not on file  Food Insecurity: Not on file  Transportation Needs: Not on file  Physical Activity: Not on file  Stress: Not on file  Social Connections: Not on file  Intimate Partner Violence: Not on file    Outpatient Medications Prior to Visit  Medication Sig Dispense Refill   estradiol (VIVELLE-DOT) 0.0375 MG/24HR PLACE 1 PATCH ON THE SKIN TWICE A WEEK 24 patch 3   hydrocortisone (ANUSOL-HC) 2.5 % rectal cream Place rectally 2 (two) times daily. 30 g 1   levothyroxine (SYNTHROID) 88 MCG tablet Take 1 tablet (88 mcg total) by mouth daily before breakfast. 90 tablet 3   Multiple Vitamins-Minerals (MULTIVITAMIN PO) Take by mouth daily.     naproxen sodium (ANAPROX DS) 550 MG tablet Take 1 tablet (550 mg total) by mouth 2 (two) times daily with a meal. Use prn cramps 30 tablet 1   diazepam (VALIUM) 5 MG tablet SMARTSIG:1-2 Tablet(s) By Mouth     No facility-administered medications prior to visit.  No Known Allergies  Review of Systems REFER TO HPI FOR PERTINENT POSITIVES AND NEGATIVES     Objective:    Physical Exam Vitals and nursing note reviewed.  Constitutional:      General: She is not in acute distress.    Appearance: Normal appearance. She is not ill-appearing.  HENT:     Head: Normocephalic and atraumatic.  Cardiovascular:     Rate and Rhythm: Normal rate and regular rhythm.     Pulses: Normal pulses.     Heart sounds: Normal heart sounds.  Pulmonary:     Effort: Pulmonary effort is normal.     Breath sounds: Normal breath sounds.  Abdominal:     General: Abdomen is flat. Bowel sounds are normal.     Palpations: Abdomen is soft.     Tenderness: There is abdominal tenderness (suprapubic). There is no right CVA tenderness or left CVA tenderness.  Skin:    General: Skin is warm and dry.  Neurological:     General: No focal deficit present.     Mental Status: She is alert.   Psychiatric:        Mood and Affect: Mood normal.    BP (!) 152/93   Pulse 67   Temp 98.2 F (36.8 C)   Ht 5' 1.5" (1.562 m)   Wt 124 lb 4 oz (56.4 kg)   SpO2 100%   BMI 23.10 kg/m  Wt Readings from Last 3 Encounters:  10/22/20 124 lb 4 oz (56.4 kg)  09/04/20 123 lb 6.4 oz (56 kg)  08/13/20 125 lb 4 oz (56.8 kg)    Health Maintenance Due  Topic Date Due   Pneumococcal Vaccine 68-56 Years old (1 - PCV) Never done   Zoster Vaccines- Shingrix (1 of 2) Never done   COVID-19 Vaccine (3 - Pfizer risk series) 07/10/2019   INFLUENZA VACCINE  09/08/2020    There are no preventive care reminders to display for this patient.   Lab Results  Component Value Date   TSH 1.85 06/16/2020   Lab Results  Component Value Date   WBC 5.8 06/16/2020   HGB 13.4 06/16/2020   HCT 38.6 06/16/2020   MCV 93.6 06/16/2020   PLT 206.0 06/16/2020   Lab Results  Component Value Date   NA 139 06/16/2020   K 3.8 06/16/2020   CO2 27 06/16/2020   GLUCOSE 80 06/16/2020   BUN 17 06/16/2020   CREATININE 0.85 06/16/2020   BILITOT 0.6 06/16/2020   ALKPHOS 45 06/16/2020   AST 19 06/16/2020   ALT 16 06/16/2020   PROT 7.2 06/16/2020   ALBUMIN 4.5 06/16/2020   CALCIUM 9.2 06/16/2020   GFR 76.56 06/16/2020   Lab Results  Component Value Date   CHOL 188 06/16/2020   Lab Results  Component Value Date   HDL 55.00 06/16/2020   Lab Results  Component Value Date   LDLCALC 113 (H) 06/16/2020   Lab Results  Component Value Date   TRIG 101.0 06/16/2020   Lab Results  Component Value Date   CHOLHDL 3 06/16/2020   No results found for: HGBA1C     Assessment & Plan:   Problem List Items Addressed This Visit   None Visit Diagnoses     Frequency of urination    -  Primary   Relevant Orders   POCT Urinalysis Dipstick (Automated) (Completed)       1. Nocturia more than twice per night 2. Polydipsia 3. Primary hypothyroidism Urinalysis normal. Will check  urine culture and labs,  call with results. Decrease caffeine intake daily and cut off water by 6 or 7 pm at the latest. Consider Ultrasound / further work-up if everything comes back negative & symptoms persist.    Haylen Shelnutt M Kelsey Durflinger, PA-C

## 2020-10-23 LAB — CBC WITH DIFFERENTIAL/PLATELET
Basophils Absolute: 0 10*3/uL (ref 0.0–0.1)
Basophils Relative: 0.5 % (ref 0.0–3.0)
Eosinophils Absolute: 0 10*3/uL (ref 0.0–0.7)
Eosinophils Relative: 0.2 % (ref 0.0–5.0)
HCT: 41.8 % (ref 36.0–46.0)
Hemoglobin: 14.2 g/dL (ref 12.0–15.0)
Lymphocytes Relative: 25.5 % (ref 12.0–46.0)
Lymphs Abs: 2.3 10*3/uL (ref 0.7–4.0)
MCHC: 34 g/dL (ref 30.0–36.0)
MCV: 96 fl (ref 78.0–100.0)
Monocytes Absolute: 0.5 10*3/uL (ref 0.1–1.0)
Monocytes Relative: 6 % (ref 3.0–12.0)
Neutro Abs: 6.1 10*3/uL (ref 1.4–7.7)
Neutrophils Relative %: 67.8 % (ref 43.0–77.0)
Platelets: 226 10*3/uL (ref 150.0–400.0)
RBC: 4.35 Mil/uL (ref 3.87–5.11)
RDW: 13.2 % (ref 11.5–15.5)
WBC: 9 10*3/uL (ref 4.0–10.5)

## 2020-10-23 LAB — COMPREHENSIVE METABOLIC PANEL
ALT: 18 U/L (ref 0–35)
AST: 19 U/L (ref 0–37)
Albumin: 4.5 g/dL (ref 3.5–5.2)
Alkaline Phosphatase: 54 U/L (ref 39–117)
BUN: 16 mg/dL (ref 6–23)
CO2: 27 mEq/L (ref 19–32)
Calcium: 9.5 mg/dL (ref 8.4–10.5)
Chloride: 101 mEq/L (ref 96–112)
Creatinine, Ser: 0.71 mg/dL (ref 0.40–1.20)
GFR: 94.78 mL/min (ref >60.00)
Glucose, Bld: 79 mg/dL (ref 70–99)
Potassium: 3.9 mEq/L (ref 3.5–5.1)
Sodium: 138 mEq/L (ref 135–145)
Total Bilirubin: 0.6 mg/dL (ref 0.2–1.2)
Total Protein: 7.3 g/dL (ref 6.0–8.3)

## 2020-10-23 LAB — URINE CULTURE
MICRO NUMBER:: 12373139
SPECIMEN QUALITY:: ADEQUATE

## 2020-10-23 LAB — TSH: TSH: 1.16 u[IU]/mL (ref 0.35–5.50)

## 2020-10-27 ENCOUNTER — Other Ambulatory Visit: Payer: Self-pay | Admitting: Physician Assistant

## 2020-10-27 ENCOUNTER — Other Ambulatory Visit: Payer: Self-pay

## 2020-10-27 ENCOUNTER — Telehealth: Payer: Self-pay

## 2020-10-27 DIAGNOSIS — R631 Polydipsia: Secondary | ICD-10-CM

## 2020-10-27 DIAGNOSIS — R351 Nocturia: Secondary | ICD-10-CM

## 2020-10-27 NOTE — Telephone Encounter (Signed)
Patient called in stating that she seen Alyssa last week, and wanted to inform us that she wants to proceed with the [place].

## 2020-10-27 NOTE — Telephone Encounter (Signed)
[place] will be placed. Pt will be contacted to schedule.

## 2020-10-28 ENCOUNTER — Other Ambulatory Visit: Payer: BC Managed Care – PPO

## 2020-10-29 ENCOUNTER — Ambulatory Visit
Admission: RE | Admit: 2020-10-29 | Discharge: 2020-10-29 | Disposition: A | Discharge status: Home / Self Care | Payer: BC Managed Care – PPO | Source: Ambulatory Visit | Attending: Physician Assistant | Admitting: Physician Assistant

## 2020-10-29 DIAGNOSIS — R351 Nocturia: Secondary | ICD-10-CM

## 2020-10-29 DIAGNOSIS — R631 Polydipsia: Secondary | ICD-10-CM | POA: Diagnosis not present

## 2020-10-30 DIAGNOSIS — M47812 Spondylosis without myelopathy or radiculopathy, cervical region: Secondary | ICD-10-CM | POA: Diagnosis not present

## 2020-11-03 ENCOUNTER — Encounter: Payer: Self-pay | Admitting: Physician Assistant

## 2020-11-03 NOTE — Progress Notes (Signed)
I sent a MyChart message to patient.

## 2020-11-17 DIAGNOSIS — M5412 Radiculopathy, cervical region: Secondary | ICD-10-CM | POA: Diagnosis not present

## 2020-11-17 DIAGNOSIS — M47812 Spondylosis without myelopathy or radiculopathy, cervical region: Secondary | ICD-10-CM | POA: Diagnosis not present

## 2020-11-17 DIAGNOSIS — R030 Elevated blood-pressure reading, without diagnosis of hypertension: Secondary | ICD-10-CM | POA: Diagnosis not present

## 2020-12-01 DIAGNOSIS — M47812 Spondylosis without myelopathy or radiculopathy, cervical region: Secondary | ICD-10-CM | POA: Diagnosis not present

## 2020-12-15 DIAGNOSIS — R030 Elevated blood-pressure reading, without diagnosis of hypertension: Secondary | ICD-10-CM | POA: Diagnosis not present

## 2020-12-15 DIAGNOSIS — M47812 Spondylosis without myelopathy or radiculopathy, cervical region: Secondary | ICD-10-CM | POA: Diagnosis not present

## 2021-01-09 DIAGNOSIS — M47812 Spondylosis without myelopathy or radiculopathy, cervical region: Secondary | ICD-10-CM | POA: Diagnosis not present

## 2021-04-10 DIAGNOSIS — M47812 Spondylosis without myelopathy or radiculopathy, cervical region: Secondary | ICD-10-CM | POA: Diagnosis not present

## 2021-04-10 DIAGNOSIS — M5412 Radiculopathy, cervical region: Secondary | ICD-10-CM | POA: Diagnosis not present

## 2021-06-17 ENCOUNTER — Encounter: Payer: BC Managed Care – PPO | Admitting: Physician Assistant

## 2021-06-24 DIAGNOSIS — M47812 Spondylosis without myelopathy or radiculopathy, cervical region: Secondary | ICD-10-CM | POA: Diagnosis not present

## 2021-06-30 ENCOUNTER — Encounter: Payer: Self-pay | Admitting: Physician Assistant

## 2021-06-30 ENCOUNTER — Ambulatory Visit (INDEPENDENT_AMBULATORY_CARE_PROVIDER_SITE_OTHER): Payer: BC Managed Care – PPO | Admitting: Physician Assistant

## 2021-06-30 VITALS — BP 108/76 | HR 70 | Temp 97.9°F | Ht 61.81 in | Wt 122.4 lb

## 2021-06-30 DIAGNOSIS — R251 Tremor, unspecified: Secondary | ICD-10-CM

## 2021-06-30 DIAGNOSIS — Z23 Encounter for immunization: Secondary | ICD-10-CM | POA: Diagnosis not present

## 2021-06-30 DIAGNOSIS — E7800 Pure hypercholesterolemia, unspecified: Secondary | ICD-10-CM

## 2021-06-30 DIAGNOSIS — E039 Hypothyroidism, unspecified: Secondary | ICD-10-CM | POA: Diagnosis not present

## 2021-06-30 DIAGNOSIS — Z0000 Encounter for general adult medical examination without abnormal findings: Secondary | ICD-10-CM | POA: Diagnosis not present

## 2021-06-30 LAB — CBC WITH DIFFERENTIAL/PLATELET
Basophils Absolute: 0 10*3/uL (ref 0.0–0.1)
Basophils Relative: 0.6 % (ref 0.0–3.0)
Eosinophils Absolute: 0 10*3/uL (ref 0.0–0.7)
Eosinophils Relative: 0.5 % (ref 0.0–5.0)
HCT: 39.7 % (ref 36.0–46.0)
Hemoglobin: 13.7 g/dL (ref 12.0–15.0)
Lymphocytes Relative: 26.6 % (ref 12.0–46.0)
Lymphs Abs: 1.6 10*3/uL (ref 0.7–4.0)
MCHC: 34.5 g/dL (ref 30.0–36.0)
MCV: 94.4 fl (ref 78.0–100.0)
Monocytes Absolute: 0.6 10*3/uL (ref 0.1–1.0)
Monocytes Relative: 9.4 % (ref 3.0–12.0)
Neutro Abs: 3.7 10*3/uL (ref 1.4–7.7)
Neutrophils Relative %: 62.9 % (ref 43.0–77.0)
Platelets: 209 10*3/uL (ref 150.0–400.0)
RBC: 4.2 Mil/uL (ref 3.87–5.11)
RDW: 13.1 % (ref 11.5–15.5)
WBC: 5.9 10*3/uL (ref 4.0–10.5)

## 2021-06-30 LAB — VITAMIN B12: Vitamin B-12: 499 pg/mL (ref 211–911)

## 2021-06-30 LAB — COMPREHENSIVE METABOLIC PANEL
ALT: 19 U/L (ref 0–35)
AST: 22 U/L (ref 0–37)
Albumin: 4.6 g/dL (ref 3.5–5.2)
Alkaline Phosphatase: 48 U/L (ref 39–117)
BUN: 16 mg/dL (ref 6–23)
CO2: 28 mEq/L (ref 19–32)
Calcium: 9.5 mg/dL (ref 8.4–10.5)
Chloride: 105 mEq/L (ref 96–112)
Creatinine, Ser: 0.67 mg/dL (ref 0.40–1.20)
GFR: 96.96 mL/min (ref >60.00)
Glucose, Bld: 85 mg/dL (ref 70–99)
Potassium: 4.2 mEq/L (ref 3.5–5.1)
Sodium: 139 mEq/L (ref 135–145)
Total Bilirubin: 0.7 mg/dL (ref 0.2–1.2)
Total Protein: 7.3 g/dL (ref 6.0–8.3)

## 2021-06-30 LAB — LIPID PANEL
Cholesterol: 206 mg/dL — ABNORMAL HIGH (ref 0–200)
HDL: 66.8 mg/dL (ref >39.00)
LDL Cholesterol: 118 mg/dL — ABNORMAL HIGH (ref 0–99)
NonHDL: 139.48
Total CHOL/HDL Ratio: 3
Triglycerides: 107 mg/dL (ref 0.0–149.0)
VLDL: 21.4 mg/dL (ref 0.0–40.0)

## 2021-06-30 LAB — TSH: TSH: 1.31 u[IU]/mL (ref 0.35–5.50)

## 2021-06-30 NOTE — Progress Notes (Signed)
Complete physical exam  Patient: Ashley Murphy   DOB: 1966-02-06   56 y.o. 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 Tremors have been happening for a few years and thinks it may associated with stress    Ashley Murphy is a 56 y.o. female who presents today for a complete physical exam. She reports consuming a general diet.  Walking, kayaking; limited by reconstruction of R ankle and issues with neck  She generally feels well. She reports not sleeping well, wakes up often. She does have additional problems to discuss today.  Notices more hair loss than normal. No bald spots, just thinning. Some fatigue but not more than normal [facility] - nerve blocks & epidurals attempted, settled on Celebrex  Tremors "associated with stress", if holding something, she does better; seems to have been going on for years now 1.5 cup coffee each day, no sodas ETOH - wine occasionally, nothing daily, tremor does not improve with ETOH   Most recent fall risk assessment:    06/30/2021    7:37 AM  Fall Risk   Falls in the past year? 0  Number falls in past yr: 0  Injury with Fall? 0  Risk for fall due to : No Fall Risks  Follow up Falls evaluation completed     Most recent depression screenings:    06/30/2021    7:37 AM 06/16/2020    8:32 AM  PHQ 2/9 Scores  PHQ - 2 Score 0 0    Vision:Within last year and Dental: No current dental problems  Patient Active Problem List   Diagnosis Date Noted   Family history of melanoma 09/22/2020   Family history of colon cancer 09/22/2020   Granuloma annulare 06/10/2017   Melanoma of skin ([place]) 06/10/2017   Ankle fracture, right 07/28/2015   Primary hypothyroidism 07/28/2015   Restless legs  syndrome (RLS) 07/28/2015   Extrinsic asthma 07/28/2015   Past Medical History:  Diagnosis Date   Anemia    Anxiety    Asthma    Family history of colon cancer    Family history of melanoma    Thyroid disease    hypothyroid   Urinary incontinence    not severe      Patient Care Team: Joannie Medine, Randa Evens, PA-C as PCP - General (Physician Assistant)   Outpatient Medications Prior to Visit  Medication Sig   estradiol (VIVELLE-DOT) 0.0375 MG/24HR PLACE 1 PATCH ON THE SKIN TWICE A WEEK   hydrocortisone (ANUSOL-HC) 2.5 % rectal cream Place rectally 2 (two) times daily.   levothyroxine (SYNTHROID) 88 MCG tablet Take 1 tablet (88 mcg total) by mouth daily before breakfast.   Multiple Vitamins-Minerals (MULTIVITAMIN PO) Take by mouth daily.   naproxen sodium (ANAPROX DS) 550 MG tablet Take 1 tablet (550 mg total) by mouth 2 (two) times daily with a meal. Use prn cramps   celecoxib (CELEBREX) 200 MG capsule Take by mouth 2 (two) times daily.   No facility-administered medications prior to visit.    ROS  REFER TO HPI FOR PERTINENT POSITIVES AND NEGATIVES     Objective:     BP 108/76 (BP Location: Right Arm)  Pulse 70   Temp 97.9 F (36.6 C) (Temporal)   Ht 5' 1.81" (1.57 m)   Wt 122 lb 6.4 oz (55.5 kg)   SpO2 96%   BMI 22.52 kg/m  BP Readings from Last 3 Encounters:  06/30/21 108/76  10/22/20 (!) 148/88  09/04/20 (!) 158/78   Wt Readings from Last 3 Encounters:  06/30/21 122 lb 6.4 oz (55.5 kg)  10/22/20 124 lb 4 oz (56.4 kg)  09/04/20 123 lb 6.4 oz (56 kg)      Physical Exam Vitals and nursing note reviewed.  Constitutional:      Appearance: Normal appearance. She is normal weight. She is not toxic-appearing.  HENT:     Head: Normocephalic and atraumatic.     Right Ear: External ear normal.     Left Ear: External ear normal.     Nose: Nose normal.     Mouth/Throat:     Mouth: Mucous membranes are moist.  Eyes:     Extraocular Movements: Extraocular  movements intact.     Conjunctiva/sclera: Conjunctivae normal.     Pupils: Pupils are equal, round, and reactive to light.  Cardiovascular:     Rate and Rhythm: Normal rate and regular rhythm.     Pulses: Normal pulses.     Heart sounds: Normal heart sounds.  Pulmonary:     Effort: Pulmonary effort is normal.     Breath sounds: Normal breath sounds.  Abdominal:     General: Abdomen is flat. Bowel sounds are normal.     Palpations: Abdomen is soft.  Musculoskeletal:        General: Normal range of motion.     Cervical back: Normal range of motion and neck supple.  Skin:    General: Skin is warm and dry.  Neurological:     General: No focal deficit present.     Mental Status: She is alert and oriented to person, place, and time.     Cranial Nerves: No cranial nerve deficit.     Coordination: Coordination normal.     Gait: Gait normal.     Comments: Mild intention tremor noted in right hand > left   Psychiatric:        Mood and Affect: Mood normal.        Behavior: Behavior normal.        Thought Content: Thought content normal.        Judgment: Judgment normal.     No results found for any visits on 06/30/21. Last thyroid functions Lab Results  Component Value Date   TSH 1.16 10/22/2020   T4TOTAL 8.7 04/15/2016        Assessment & Plan:    Routine Health Maintenance and Physical Exam  Immunization History  Administered Date(s) Administered   Influenza,inj,Quad PF,6+ Mos 11/20/2015   PFIZER(Purple Top)SARS-COV-2 Vaccination 05/18/2019, 06/12/2019    Health Maintenance  Topic Date Due   Zoster Vaccines- Shingrix (1 of 2) Never done   COVID-19 Vaccine (3 - Pfizer risk series) 07/16/2021 (Originally 07/10/2019)   INFLUENZA VACCINE  09/08/2021   MAMMOGRAM  08/17/2022   TETANUS/TDAP  08/08/2024   PAP SMEAR-Modifier  08/16/2024   COLONOSCOPY (Pts 45-64yr Insurance coverage will need to be confirmed)  01/27/2026   Hepatitis C Screening  Completed   HIV Screening   Completed   HPV VACCINES  Aged Out    Discussed health benefits of physical activity, and encouraged her to engage in regular exercise appropriate for her age and condition.  Problem List Items Addressed This Visit       Endocrine   Primary hypothyroidism   Relevant Orders   TSH   Other Visit Diagnoses     Encounter for annual physical exam    -  Primary   Relevant Orders   CBC with Differential/Platelet   Comprehensive metabolic panel   Lipid panel   TSH   Vitamin B12   Physiological tremor       Relevant Orders   TSH   Vitamin B12   Elevated LDL cholesterol level       Relevant Orders   Lipid panel   Need for prophylactic vaccination and inoculation against varicella       Relevant Orders   Varicella-zoster vaccine IM (Shingrix)      Age-appropriate screening and counseling performed today. Will check labs and call with results. Preventive measures discussed and printed in AVS for patient.   Shingrix updated today.  Consider neuro referral if no telling causes on labs today.   Return in about 1 year (around 07/01/2022) for fasting labs, cpe .     Van Seymore M Savonna Birchmeier, PA-C

## 2021-06-30 NOTE — Patient Instructions (Signed)
Great to see you today! Keep up good work with health. Labs today, treat pending results. Consider neurology referral if no telling signs on labs for tremor.

## 2021-07-02 ENCOUNTER — Encounter: Payer: BC Managed Care – PPO | Admitting: Physician Assistant

## 2021-07-22 ENCOUNTER — Ambulatory Visit (INDEPENDENT_AMBULATORY_CARE_PROVIDER_SITE_OTHER): Payer: BC Managed Care – PPO | Admitting: Physician Assistant

## 2021-07-22 ENCOUNTER — Encounter: Payer: Self-pay | Admitting: Physician Assistant

## 2021-07-22 ENCOUNTER — Ambulatory Visit: Payer: BC Managed Care – PPO | Admitting: Physician Assistant

## 2021-07-22 VITALS — BP 140/85 | HR 78 | Temp 98.1°F | Ht 61.8 in | Wt 123.6 lb

## 2021-07-22 DIAGNOSIS — J029 Acute pharyngitis, unspecified: Secondary | ICD-10-CM

## 2021-07-22 DIAGNOSIS — R490 Dysphonia: Secondary | ICD-10-CM | POA: Diagnosis not present

## 2021-07-22 LAB — POCT RAPID STREP A (OFFICE): Rapid Strep A Screen: NEGATIVE

## 2021-07-22 MED ORDER — MAGIC MOUTHWASH W/LIDOCAINE: 5.0000 mL | Freq: Three times a day (TID) | ORAL | 1 refills | Status: AC | PRN

## 2021-07-22 NOTE — Progress Notes (Signed)
Subjective:    Patient ID: Ashley Murphy, female    DOB: 05-14-1965, 56 y.o.   MRN: [id number]  Chief Complaint  Patient presents with   Sore Throat    Pt has been having sore throat and roof of mouth and tongue numb; some cough but dry not productive; nothing like laryngitis per pt; pt states she has issues with neck and wonders if symptoms are related especially tongue being numb    HPI Patient is in today for sore throat / "numb feeling" roof of mouth and tongue. Started 07/08/21. ST, dry feeling. Sometimes wakes up and it's ok, then raw feeling as day goes on. Doesn't have a lot of drainage. No cough or fever. Occasional headaches. Occ feels like food getting caught in throat. Pushing fluids. No changes in meds or supplements.  Taking Celebrex and ibuprofen for her neck.  Home COVID test was negative.  First shingles dose at physical on 06/30/21. No sick contacts.  No hx of acid reflux.   Past Medical History:  Diagnosis Date   Anemia    Anxiety    Asthma    Family history of colon cancer    Family history of melanoma    Thyroid disease    hypothyroid   Urinary incontinence    not severe    Past Surgical History:  Procedure Laterality Date   ANKLE ARTHROSCOPY WITH RECONSTRUCTION Right    CESAREAN SECTION      Family History  Problem Relation Age of Onset   Arthritis Mother    Cancer Mother 36       likely from reproductive organs    Arthritis Father    Heart disease Father    Hypertension Father    Hyperlipidemia Father    Melanoma Father    Alzheimer's disease Paternal Grandmother    Cancer Paternal Grandfather    Colon cancer Paternal Grandfather    Breast cancer Maternal Great-grandmother    Esophageal cancer Neg Hx    Rectal cancer Neg Hx    Stomach cancer Neg Hx     Social History   Tobacco Use   Smoking status: Former   Smokeless tobacco: Never   Tobacco comments:    Quit 27 years ago  Substance Use Topics   Alcohol use: Yes     Alcohol/week: 0.0 standard drinks of alcohol    Comment: socially   Drug use: No     No Known Allergies  Review of Systems NEGATIVE UNLESS OTHERWISE INDICATED IN HPI      Objective:     BP 140/85 (BP Location: Left Arm)   Pulse 78   Temp 98.1 F (36.7 C) (Temporal)   Ht 5' 1.8" (1.57 m)   Wt 123 lb 9.6 oz (56.1 kg)   SpO2 100%   BMI 22.75 kg/m   Wt Readings from Last 3 Encounters:  07/22/21 123 lb 9.6 oz (56.1 kg)  06/30/21 122 lb 6.4 oz (55.5 kg)  10/22/20 124 lb 4 oz (56.4 kg)    BP Readings from Last 3 Encounters:  07/22/21 140/85  06/30/21 108/76  10/22/20 (!) 148/88     Physical Exam Vitals and nursing note reviewed.  Constitutional:      Appearance: Normal appearance. She is normal weight. She is not toxic-appearing.  HENT:     Head: Normocephalic and atraumatic.     Right Ear: Tympanic membrane, ear canal and external ear normal.     Left Ear: Tympanic membrane, ear canal and external  ear normal.     Nose: Nose normal.     Mouth/Throat:     Mouth: Mucous membranes are moist.     Comments: Some hoarse voice noted Eyes:     Extraocular Movements: Extraocular movements intact.     Conjunctiva/sclera: Conjunctivae normal.     Pupils: Pupils are equal, round, and reactive to light.  Cardiovascular:     Rate and Rhythm: Normal rate and regular rhythm.     Pulses: Normal pulses.     Heart sounds: Normal heart sounds.  Pulmonary:     Effort: Pulmonary effort is normal.     Breath sounds: Normal breath sounds.  Abdominal:     General: Abdomen is flat. Bowel sounds are normal.     Palpations: Abdomen is soft.  Musculoskeletal:        General: Normal range of motion.     Cervical back: Normal range of motion and neck supple.  Skin:    General: Skin is warm and dry.  Neurological:     General: No focal deficit present.     Mental Status: She is alert and oriented to person, place, and time.  Psychiatric:        Mood and Affect: Mood normal.         Behavior: Behavior normal.        Thought Content: Thought content normal.        Judgment: Judgment normal.        Assessment & Plan:   Problem List Items Addressed This Visit   None Visit Diagnoses     Sore throat    -  Primary   Relevant Orders   POCT rapid strep A (Completed)   Ambulatory referral to ENT   Hoarseness of voice       Relevant Orders   Ambulatory referral to ENT        Meds ordered this encounter  Medications   magic mouthwash w/lidocaine SOLN    Sig: Take 5 mLs by mouth 3 (three) times daily as needed for up to 10 days for mouth pain.    Dispense:  120 mL    Refill:  1    1:1:1:1 ratio of diphenhydramine, alum mag hydroxide-simeth, nystatin, lidocaine hcl    Order Specific Question:   Supervising Provider    Answer:   Marin Olp [[id number]]   PLAN: Reassured patient that her rapid strep a was negative at visit today.  Encouraged her to try to stay hydrated and use Magic mouthwash as directed.  She may also use Tylenol or Motrin as needed.  Voice rest and tea with honey also encouraged.  Referral sent to ENT in case she worsens or does not improve.   This note was prepared with assistance of Systems analyst. Occasional wrong-word or sound-a-like substitutions may have occurred due to the inherent limitations of voice recognition software.  Kaycen Whitworth M Kaidin Boehle, PA-C

## 2021-07-22 NOTE — Patient Instructions (Signed)
Good to see you today!  Your strep was negative. Try to get hydrated. Use the MMW as directed.  Referral to ENT.

## 2021-08-10 ENCOUNTER — Other Ambulatory Visit: Payer: Self-pay | Admitting: Family Medicine

## 2021-08-10 DIAGNOSIS — E039 Hypothyroidism, unspecified: Secondary | ICD-10-CM

## 2021-08-11 ENCOUNTER — Other Ambulatory Visit: Payer: Self-pay | Admitting: Obstetrics and Gynecology

## 2021-08-11 DIAGNOSIS — Z79890 Hormone replacement therapy (postmenopausal): Secondary | ICD-10-CM

## 2021-08-12 NOTE — Telephone Encounter (Signed)
Last annual exam 08/2020 Scheduled on 09/08/21 Last mammogram 08/2020

## 2021-08-21 DIAGNOSIS — Z1231 Encounter for screening mammogram for malignant neoplasm of breast: Secondary | ICD-10-CM | POA: Diagnosis not present

## 2021-08-21 LAB — HM MAMMOGRAPHY

## 2021-08-28 ENCOUNTER — Encounter: Payer: Self-pay | Admitting: Physician Assistant

## 2021-08-28 DIAGNOSIS — N6002 Solitary cyst of left breast: Secondary | ICD-10-CM | POA: Diagnosis not present

## 2021-08-28 DIAGNOSIS — R921 Mammographic calcification found on diagnostic imaging of breast: Secondary | ICD-10-CM | POA: Diagnosis not present

## 2021-08-31 NOTE — Progress Notes (Signed)
56 y.o. G2P0202 Married White or Caucasian Not Hispanic or Latino female here for annual exam.  She has a mirena IUD, inserted in 7/21 for contraception and severe dysmenorrhea. No cycles. She is on the 0.0375 mg estrogen patch. No vasomotor symptoms. Willing to go to the lower dose. Sexually active, no pain.    She gets occasional twinges of pelvic pain, has been occurring for the last several months. Only lasts a few seconds. No bowel or bladder c/o.   Patient had a breast bx last week and it was calcification.   Also patient's mother passed for cancer or an unknown origin but the cancer was found though CA125. Patient would like to discuss this.  She was told at the time that it was likely an ovarian primary. She was diagnosed at 62.     No LMP recorded. (Menstrual status: IUD).          Sexually active: Yes.    The current method of family planning is IUD. Inserted in 7/21  Exercising: Yes.     Kayaking  Smoker:  no  Health Maintenance: Pap:   7/9/21WNL Hr HPV neg  05/21/2016 Neg HPV Neg  History of abnormal Pap:  no MMG:  08/28/21 had a breast bx. And was benign report has been requested  BMD:   none  Colonoscopy:01/28/16 normal f/u 10 years  TDaP:  2016 Gardasil: NA   reports that she has quit smoking. She has never used smokeless tobacco. She reports current alcohol use. She reports that she does not use drugs. Drinks 2 glasses of wine a week. Works as a Government social research officer. 2 biological sons (both local) and one stepson (she helped to raise him). 5 grandchildren.   Past Medical History:  Diagnosis Date   Anemia    Anxiety    Asthma    Family history of colon cancer    Family history of melanoma    Thyroid disease    hypothyroid   Urinary incontinence    not severe    Past Surgical History:  Procedure Laterality Date   ANKLE ARTHROSCOPY WITH RECONSTRUCTION Right    CESAREAN SECTION      Current Outpatient Medications  Medication Sig Dispense Refill   celecoxib  (CELEBREX) 200 MG capsule Take by mouth 2 (two) times daily.     estradiol (VIVELLE-DOT) 0.0375 MG/24HR APPLY 1 PATCH ON THE SKIN TWICE A WEEK 24 patch 0   hydrocortisone (ANUSOL-HC) 2.5 % rectal cream Place rectally 2 (two) times daily. 30 g 1   levothyroxine (SYNTHROID) 88 MCG tablet TAKE 1 TABLET DAILY BEFORE BREAKFAST 90 tablet 3   Multiple Vitamins-Minerals (MULTIVITAMIN PO) Take by mouth daily.     No current facility-administered medications for this visit.    Family History  Problem Relation Age of Onset   Arthritis Mother    Cancer Mother 43       likely from reproductive organs    Arthritis Father    Heart disease Father    Hypertension Father    Hyperlipidemia Father    Melanoma Father    Alzheimer's disease Paternal Grandmother    Cancer Paternal Grandfather    Colon cancer Paternal Grandfather    Breast cancer Maternal Great-grandmother    Esophageal cancer Neg Hx    Rectal cancer Neg Hx    Stomach cancer Neg Hx     Review of Systems  All other systems reviewed and are negative.   Exam:   BP 110/68   Pulse  65   Ht 5' 1.5" (1.562 m)   Wt 125 lb (56.7 kg)   SpO2 100%   BMI 23.24 kg/m   Weight change: '@WEIGHTCHANGE'$ @ Height:   Height: 5' 1.5" (156.2 cm)  Ht Readings from Last 3 Encounters:  09/08/21 5' 1.5" (1.562 m)  07/22/21 5' 1.8" (1.57 m)  06/30/21 5' 1.81" (1.57 m)    General appearance: alert, cooperative and appears stated age Head: Normocephalic, without obvious abnormality, atraumatic Neck: no adenopathy, supple, symmetrical, trachea midline and thyroid normal to inspection and palpation Lungs: clear to auscultation bilaterally Cardiovascular: regular rate and rhythm Breasts: normal appearance, no masses or tenderness Abdomen: soft, non-tender; non distended,  no masses,  no organomegaly Extremities: extremities normal, atraumatic, no cyanosis or edema Skin: Skin color, texture, turgor normal. No rashes or lesions Lymph nodes: Cervical,  supraclavicular, and axillary nodes normal. No abnormal inguinal nodes palpated Neurologic: Grossly normal   Pelvic: External genitalia:  no lesions              Urethra:  normal appearing urethra with no masses, tenderness or lesions              Bartholins and Skenes: normal                 Vagina: normal appearing vagina with normal color and discharge, no lesions              Cervix: no lesions and IUD string 3 cm               Bimanual Exam:  Uterus:  normal size, contour, position, consistency, mobility, non-tender              Adnexa: no mass, fullness, tenderness               Rectovaginal: Confirms               Anus:  normal sphincter tone, no lesions  Gae Dry, CMA chaperoned for the exam.  1. Well woman exam Discussed breast self exam Discussed calcium and vit D intake Mammogram just done, had a biopsy that was benign  2. Hormone replacement therapy On ERT 0.0375 mg with mirena IUD. We discussed risks/benefits of HRT. She wants to continue but willing to go down on the dose. She may try coming off of HRT - estradiol (VIVELLE-DOT) 0.025 MG/24HR; Place 1 patch onto the skin 2 (two) times a week.  Dispense: 24 patch; Refill: 3  3. IUD check up Doing well  4. Family history of cancer Mom with metastatic reproductive cancer at 54. Patient was seen at the genetic center, felt to not likely be genetic. Offered testing, declined. -She is asking about CA 125. Reviewed that this isn't standard of care and has the risk of a false + test. She declines for now.   5. Pelvic pain Several month h/o intermittent pelvic pain - US PELVIS TRANSVAGINAL NON-OB (TV ONLY); Future

## 2021-09-02 ENCOUNTER — Other Ambulatory Visit: Payer: Self-pay

## 2021-09-02 DIAGNOSIS — R921 Mammographic calcification found on diagnostic imaging of breast: Secondary | ICD-10-CM | POA: Diagnosis not present

## 2021-09-02 DIAGNOSIS — N6012 Diffuse cystic mastopathy of left breast: Secondary | ICD-10-CM | POA: Diagnosis not present

## 2021-09-08 ENCOUNTER — Ambulatory Visit (INDEPENDENT_AMBULATORY_CARE_PROVIDER_SITE_OTHER): Payer: BC Managed Care – PPO | Admitting: Obstetrics and Gynecology

## 2021-09-08 ENCOUNTER — Encounter: Payer: Self-pay | Admitting: Obstetrics and Gynecology

## 2021-09-08 ENCOUNTER — Encounter: Payer: Self-pay | Admitting: Physician Assistant

## 2021-09-08 VITALS — BP 110/68 | HR 65 | Ht 61.5 in | Wt 125.0 lb

## 2021-09-08 DIAGNOSIS — Z79890 Hormone replacement therapy (postmenopausal): Secondary | ICD-10-CM | POA: Diagnosis not present

## 2021-09-08 DIAGNOSIS — Z01419 Encounter for gynecological examination (general) (routine) without abnormal findings: Secondary | ICD-10-CM

## 2021-09-08 DIAGNOSIS — Z809 Family history of malignant neoplasm, unspecified: Secondary | ICD-10-CM

## 2021-09-08 DIAGNOSIS — Z30431 Encounter for routine checking of intrauterine contraceptive device: Secondary | ICD-10-CM

## 2021-09-08 DIAGNOSIS — R102 Pelvic and perineal pain: Secondary | ICD-10-CM

## 2021-09-08 MED ORDER — ESTRADIOL 0.025 MG/24HR TD PTTW: 1.0000 | MEDICATED_PATCH | TRANSDERMAL | 3 refills | Status: DC

## 2021-09-10 ENCOUNTER — Ambulatory Visit: Payer: BC Managed Care – PPO

## 2021-09-10 DIAGNOSIS — R102 Pelvic and perineal pain: Secondary | ICD-10-CM | POA: Diagnosis not present

## 2021-09-17 ENCOUNTER — Telehealth: Payer: Self-pay | Admitting: Obstetrics and Gynecology

## 2021-09-17 NOTE — Telephone Encounter (Signed)
Please let the patient know that I have reviewed her ultrasound. Her fibroids are stable, her IUD is in place, her ovaries appear normal, possible adhesions of her right ovary to her uterus, but it's not clear. I don't think anything in her ultrasound explains her intermittent pain.  If her pain persists or worsens she should f/u with her primary

## 2021-09-17 NOTE — Telephone Encounter (Signed)
Patient informed of Dr. Gentry Fitz message. I read it to her.  Patient said at the ultrasound the tech told her that both her ovaries are adhered to the uterus. She said she moved the want around "trying to unadhere them" and showed patient that they moved together when she pushed on one. Patient said where she pushed on the adhered ovary was exactly where her pain is.

## 2021-09-18 NOTE — Telephone Encounter (Signed)
The patient has a h/o severe dysmenorrhea, severe dysmenorrhea can be a sign of endometriosis which can cause adhesions to form in the pelvis. The mirena IUD is a treatment for endometriosis. Going off of estrogen would help any endometriosis pain. It is hard to say with certainty that her ovaries are "stuck" and if they are it probably isn't a new development in the last few months.  If her pain persists, there is the option of laparoscopy for further evaluation. I would recommend she try coming off of the estrogen first.  If she would like to further discuss, please set her up for a visit.

## 2021-09-21 NOTE — Telephone Encounter (Signed)
Spoke with patient and informed her. °

## 2021-10-03 ENCOUNTER — Encounter: Payer: Self-pay | Admitting: Obstetrics and Gynecology

## 2021-10-03 DIAGNOSIS — Z809 Family history of malignant neoplasm, unspecified: Secondary | ICD-10-CM

## 2021-11-02 ENCOUNTER — Encounter: Payer: Self-pay | Admitting: *Deleted

## 2021-11-06 ENCOUNTER — Other Ambulatory Visit: Payer: BC Managed Care – PPO

## 2021-11-06 DIAGNOSIS — Z809 Family history of malignant neoplasm, unspecified: Secondary | ICD-10-CM

## 2021-11-07 LAB — CA 125: CA 125: 6 U/mL (ref <35)

## 2021-12-09 ENCOUNTER — Encounter: Payer: Self-pay | Admitting: Obstetrics and Gynecology

## 2021-12-20 IMAGING — DX DG CERVICAL SPINE COMPLETE 4+V
5 series · 5 of 5 positions shown · non-contrast
Comparison: None.

CLINICAL DATA: Pain

EXAM:
CERVICAL SPINE - COMPLETE 4+ VIEW

[cervical spine lat]
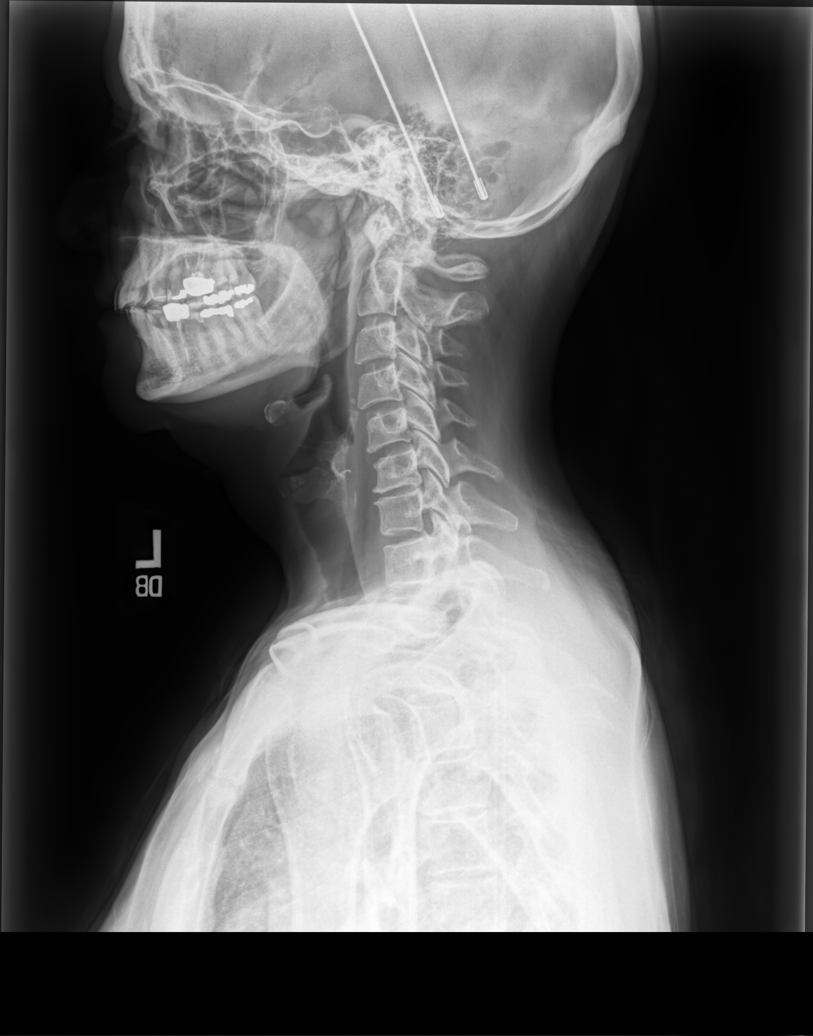

[cervical spine oblique (1 of 2)]
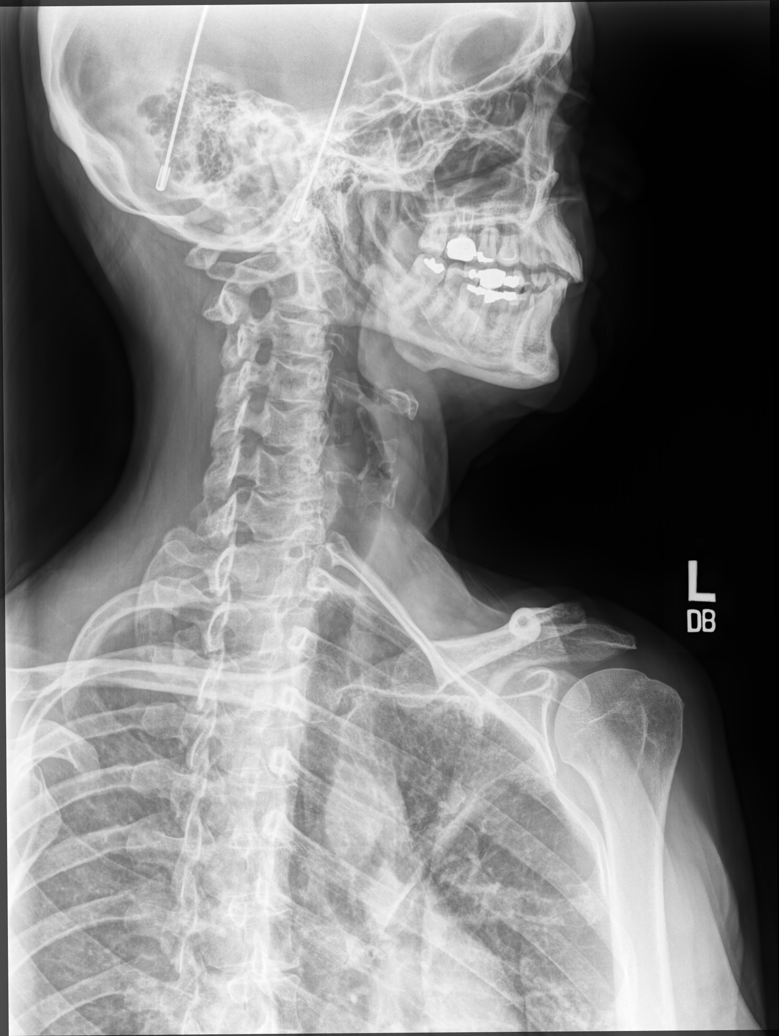

[cervical spine oblique (2 of 2)]
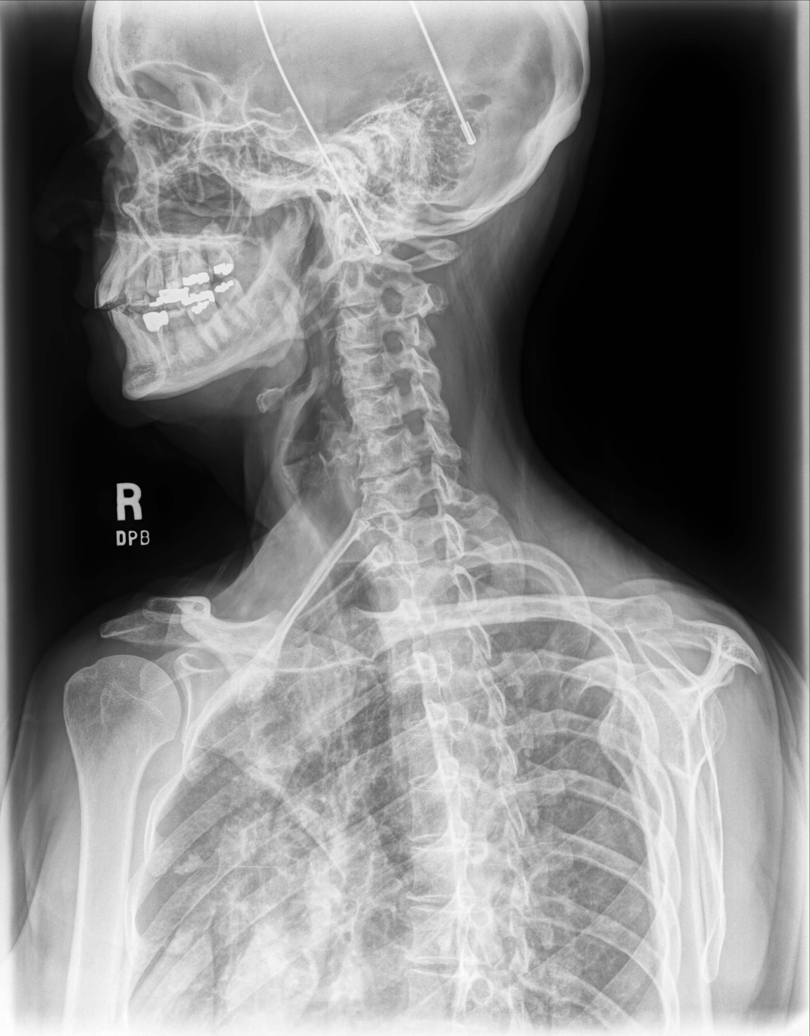

[cervical spine ap (1 of 2)]
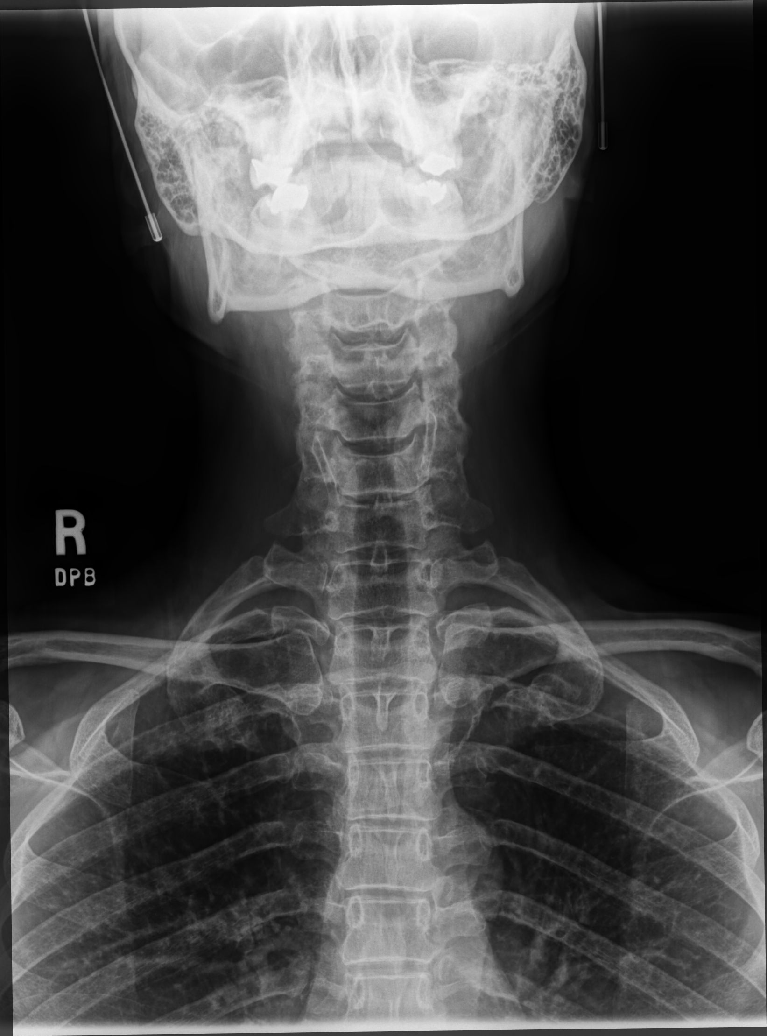

[cervical spine ap (2 of 2)]
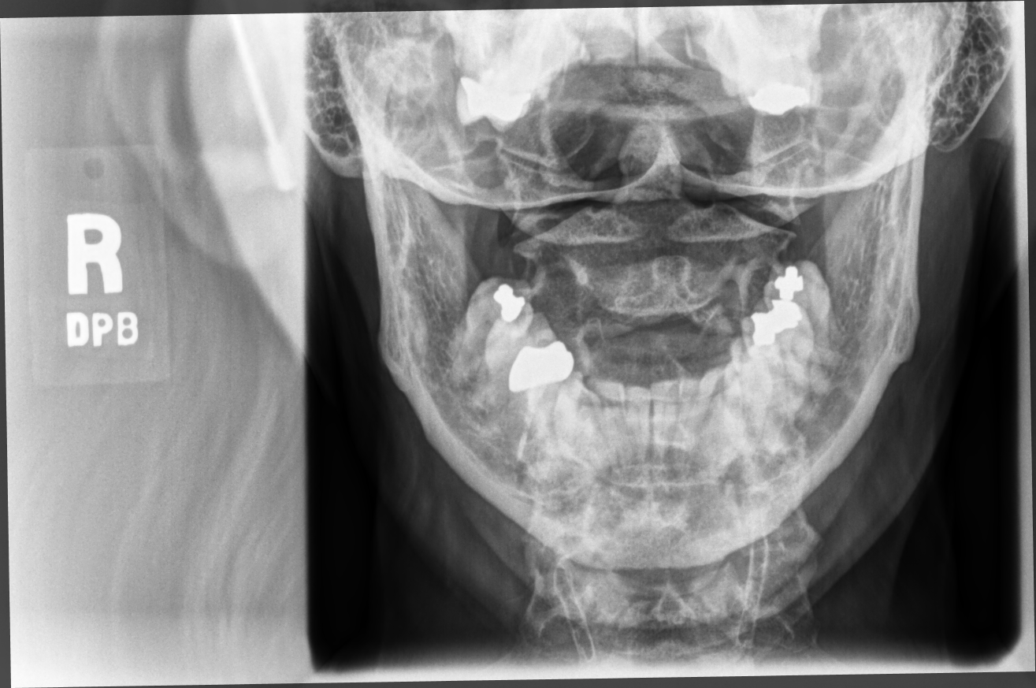

[5 of 5 positions shown; findings below may reference images not displayed]

FINDINGS: The cervical spine is visualized from C1-T1. Cervical alignment is
maintained. Vertebral body heights are maintained: no evidence of
acute fracture. Mild to moderate intervertebral disc space height
loss of C6-7 with uncovertebral hypertrophy. Mild to moderate
osseous neuroforaminal narrowing of LEFT C6-7. no prevertebral soft
tissue swelling. Visualized thorax is unremarkable.
IMPRESSION: Mild-to-moderate degenerative changes of C6-7.

## 2021-12-24 ENCOUNTER — Encounter: Payer: Self-pay | Admitting: Obstetrics and Gynecology

## 2021-12-24 ENCOUNTER — Ambulatory Visit (INDEPENDENT_AMBULATORY_CARE_PROVIDER_SITE_OTHER): Payer: BC Managed Care – PPO | Admitting: Obstetrics and Gynecology

## 2021-12-24 VITALS — BP 120/68 | HR 77 | Wt 121.0 lb

## 2021-12-24 DIAGNOSIS — R002 Palpitations: Secondary | ICD-10-CM

## 2021-12-24 DIAGNOSIS — R61 Generalized hyperhidrosis: Secondary | ICD-10-CM | POA: Diagnosis not present

## 2021-12-24 DIAGNOSIS — L659 Nonscarring hair loss, unspecified: Secondary | ICD-10-CM | POA: Diagnosis not present

## 2021-12-24 DIAGNOSIS — R232 Flushing: Secondary | ICD-10-CM

## 2021-12-24 LAB — TSH: TSH: 2.07 mIU/L (ref 0.40–4.50)

## 2021-12-24 NOTE — Patient Instructions (Signed)

## 2021-12-24 NOTE — Progress Notes (Signed)
GYNECOLOGY  VISIT   HPI: 56 y.o.   Married White or Caucasian Not Hispanic or Latino  female   [phone] with No LMP recorded. (Menstrual status: IUD).   here to discuss treatment options for vasomotor symptoms.  She has a mirena IUD, inserted in 7/21 for contraception and severe dysmenorrhea. She was using a low dose estrogen patch for vasomotor symptoms.   She is having hot flashes, bad at night, but getting worse during the day. She is waking up every 2 hours with night sweats. Heart palpitations. Irritability. Vaginal dryness.  Symptoms have gotten much worse in the last few months.  Can't get a good night of sleep.  Hair loss.   GYNECOLOGIC HISTORY: No LMP recorded. (Menstrual status: IUD). Contraception:IUD Menopausal hormone therapy: none        OB History     Gravida  2   Para  2   Term      Preterm  2   AB      Living  2      SAB      IAB      Ectopic      Multiple      Live Births  2              Patient Active Problem List   Diagnosis Date Noted   Family history of melanoma 09/22/2020   Family history of colon cancer 09/22/2020   Granuloma annulare 06/10/2017   Melanoma of skin ([place]) 06/10/2017   Ankle fracture, right 07/28/2015   Primary hypothyroidism 07/28/2015   Restless legs syndrome (RLS) 07/28/2015   Extrinsic asthma 07/28/2015    Past Medical History:  Diagnosis Date   Anemia    Anxiety    Asthma    Family history of colon cancer    Family history of melanoma    Thyroid disease    hypothyroid   Urinary incontinence    not severe    Past Surgical History:  Procedure Laterality Date   ANKLE ARTHROSCOPY WITH RECONSTRUCTION Right    CESAREAN SECTION      Current Outpatient Medications  Medication Sig Dispense Refill   levothyroxine (SYNTHROID) 88 MCG tablet TAKE 1 TABLET DAILY BEFORE BREAKFAST 90 tablet 3   Multiple Vitamins-Minerals (MULTIVITAMIN PO) Take by mouth daily.     celecoxib (CELEBREX) 200 MG capsule Take by  mouth 2 (two) times daily.     estradiol (VIVELLE-DOT) 0.025 MG/24HR Place 1 patch onto the skin 2 (two) times a week. (Patient not taking: Reported on 12/24/2021) 24 patch 3   hydrocortisone (ANUSOL-HC) 2.5 % rectal cream Place rectally 2 (two) times daily. (Patient not taking: Reported on 12/24/2021) 30 g 1   No current facility-administered medications for this visit.     ALLERGIES: Patient has no known allergies.  Family History  Problem Relation Age of Onset   Arthritis Mother    Cancer Mother 59       likely from reproductive organs    Arthritis Father    Heart disease Father    Hypertension Father    Hyperlipidemia Father    Melanoma Father    Alzheimer's disease Paternal Grandmother    Cancer Paternal Grandfather    Colon cancer Paternal Grandfather    Breast cancer Maternal Great-grandmother    Esophageal cancer Neg Hx    Rectal cancer Neg Hx    Stomach cancer Neg Hx     Social History   Socioeconomic History   Marital status: Married  Spouse name: Not on file   Number of children: Not on file   Years of education: Not on file   Highest education level: Not on file  Occupational History   Not on file  Tobacco Use   Smoking status: Former   Smokeless tobacco: Never   Tobacco comments:    Quit 27 years ago  Substance and Sexual Activity   Alcohol use: Yes    Alcohol/week: 0.0 standard drinks of alcohol    Comment: socially   Drug use: No   Sexual activity: Not on file  Other Topics Concern   Not on file  Social History Narrative   Not on file   Social Determinants of Health   Financial Resource Strain: Not on file  Food Insecurity: Not on file  Transportation Needs: Not on file  Physical Activity: Not on file  Stress: Not on file  Social Connections: Not on file  Intimate Partner Violence: Not on file    Review of Systems  All other systems reviewed and are negative.   PHYSICAL EXAMINATION:    BP 120/68   Pulse 77   Wt 121 lb (54.9 kg)    SpO2 100%   BMI 22.49 kg/m     General appearance: alert, cooperative and appears stated age  38. Hot flashes She went off of ERT last summer after having an abnormal mammogram, breast biopsy was benign. -current symptoms are severe -We discussed the options of restarting the low dose patch (she has a mirena IUD for endometrial protection), gabapentin, an SNRI or a SSRI. -After counseling as to the options, she will restart the 0.025 mg estradiol patch, she will reach out if she feels she needs to increase her dose  2. Night sweats - TSH  3. Palpitations - TSH -F/U with  primary  4. Hair loss On synthroid - TSH

## 2021-12-25 DIAGNOSIS — M47812 Spondylosis without myelopathy or radiculopathy, cervical region: Secondary | ICD-10-CM | POA: Diagnosis not present

## 2022-01-12 MED ORDER — ESTRADIOL 0.0375 MG/24HR TD PTTW: 1.0000 | MEDICATED_PATCH | TRANSDERMAL | 2 refills | Status: DC

## 2022-01-21 ENCOUNTER — Encounter: Payer: Self-pay | Admitting: *Deleted

## 2022-03-09 ENCOUNTER — Telehealth (INDEPENDENT_AMBULATORY_CARE_PROVIDER_SITE_OTHER): Payer: BC Managed Care – PPO | Admitting: Family Medicine

## 2022-03-09 ENCOUNTER — Encounter: Payer: Self-pay | Admitting: Family Medicine

## 2022-03-09 VITALS — Temp 100.9°F | Wt 123.0 lb

## 2022-03-09 DIAGNOSIS — U071 COVID-19: Secondary | ICD-10-CM | POA: Diagnosis not present

## 2022-03-09 MED ORDER — NIRMATRELVIR/RITONAVIR (PAXLOVID)TABLET: 3.0000 | ORAL_TABLET | Freq: Two times a day (BID) | ORAL | 0 refills | Status: AC

## 2022-03-09 NOTE — Progress Notes (Signed)
Subjective:    Patient ID: Ashley Murphy, female    DOB: 08-14-1965, 57 y.o.   MRN: [id number]  HPI Virtual Visit via Video Note  I connected with the patient on 03/09/22 at 11:00 AM EST by a video enabled telemedicine application and verified that I am speaking with the correct person using two identifiers.  Location patient: home Location provider:work or home office Persons participating in the virtual visit: patient, provider  I discussed the limitations of evaluation and management by telemedicine and the availability of in person appointments. The patient expressed understanding and agreed to proceed.   HPI: Here for 4 days of fevers, chills, body aches, a dry cough, and headache. No SOB. No NVD. She tested positive for the Covid virus this morning. She is drinking fluids and taking Ibuprofen.    ROS: See pertinent positives and negatives per HPI.  Past Medical History:  Diagnosis Date   Anemia    Anxiety    Asthma    Family history of colon cancer    Family history of melanoma    Thyroid disease    hypothyroid   Urinary incontinence    not severe    Past Surgical History:  Procedure Laterality Date   ANKLE ARTHROSCOPY WITH RECONSTRUCTION Right    CESAREAN SECTION      Family History  Problem Relation Age of Onset   Arthritis Mother    Cancer Mother 11       likely from reproductive organs    Arthritis Father    Heart disease Father    Hypertension Father    Hyperlipidemia Father    Melanoma Father    Alzheimer's disease Paternal Grandmother    Cancer Paternal Grandfather    Colon cancer Paternal Grandfather    Breast cancer Maternal Great-grandmother    Esophageal cancer Neg Hx    Rectal cancer Neg Hx    Stomach cancer Neg Hx      Current Outpatient Medications:    celecoxib (CELEBREX) 200 MG capsule, Take by mouth 2 (two) times daily., Disp: , Rfl:    estradiol (VIVELLE-DOT) 0.0375 MG/24HR, Place 1 patch onto the skin 2 (two) times a  week., Disp: 24 patch, Rfl: 2   hydrocortisone (ANUSOL-HC) 2.5 % rectal cream, Place rectally 2 (two) times daily., Disp: 30 g, Rfl: 1   levothyroxine (SYNTHROID) 88 MCG tablet, TAKE 1 TABLET DAILY BEFORE BREAKFAST, Disp: 90 tablet, Rfl: 3   Multiple Vitamins-Minerals (MULTIVITAMIN PO), Take by mouth daily., Disp: , Rfl:    nirmatrelvir/ritonavir (PAXLOVID) 20 x 150 MG & 10 x '100MG'$  TABS, Take 3 tablets by mouth 2 (two) times daily for 5 days. (Take nirmatrelvir 150 mg two tablets twice daily for 5 days and ritonavir 100 mg one tablet twice daily for 5 days) Patient GFR is 97, Disp: 30 tablet, Rfl: 0  EXAM:  VITALS per patient if applicable:  GENERAL: alert, oriented, appears well and in no acute distress  HEENT: atraumatic, conjunttiva clear, no obvious abnormalities on inspection of external nose and ears  NECK: normal movements of the head and neck  LUNGS: on inspection no signs of respiratory distress, breathing rate appears normal, no obvious gross SOB, gasping or wheezing  CV: no obvious cyanosis  MS: moves all visible extremities without noticeable abnormality  PSYCH/NEURO: pleasant and cooperative, no obvious depression or anxiety, speech and thought processing grossly intact  ASSESSMENT AND PLAN: Covid infection, treat with 5 days of Paxlovid. Recheck as needed.  Alysia Penna, MD  Discussed the following assessment and plan:  No diagnosis found.     I discussed the assessment and treatment plan with the patient. The patient was provided an opportunity to ask questions and all were answered. The patient agreed with the plan and demonstrated an understanding of the instructions.   The patient was advised to call back or seek an in-person evaluation if the symptoms worsen or if the condition fails to improve as anticipated.      Review of Systems     Objective:   Physical Exam        Assessment & Plan:

## 2022-03-16 ENCOUNTER — Encounter: Payer: Self-pay | Admitting: Family Medicine

## 2022-03-16 ENCOUNTER — Ambulatory Visit (INDEPENDENT_AMBULATORY_CARE_PROVIDER_SITE_OTHER): Payer: BC Managed Care – PPO | Admitting: Family Medicine

## 2022-03-16 VITALS — BP 134/84 | HR 99 | Temp 98.2°F | Ht 61.81 in | Wt 124.4 lb

## 2022-03-16 DIAGNOSIS — U071 COVID-19: Secondary | ICD-10-CM | POA: Diagnosis not present

## 2022-03-16 DIAGNOSIS — J4521 Mild intermittent asthma with (acute) exacerbation: Secondary | ICD-10-CM | POA: Diagnosis not present

## 2022-03-16 MED ORDER — BENZONATATE 100 MG PO CAPS: 100.0000 mg | ORAL_CAPSULE | Freq: Three times a day (TID) | ORAL | 0 refills | Status: DC | PRN

## 2022-03-16 MED ORDER — AZITHROMYCIN 250 MG PO TABS: ORAL_TABLET | ORAL | 0 refills | Status: AC

## 2022-03-16 MED ORDER — ALBUTEROL SULFATE HFA 108 (90 BASE) MCG/ACT IN AERS: 2.0000 | INHALATION_SPRAY | Freq: Four times a day (QID) | RESPIRATORY_TRACT | 1 refills | Status: DC | PRN

## 2022-03-16 MED ORDER — ALBUTEROL SULFATE (2.5 MG/3ML) 0.083% IN NEBU: 2.5000 mg | INHALATION_SOLUTION | Freq: Four times a day (QID) | RESPIRATORY_TRACT | 1 refills | Status: DC | PRN

## 2022-03-16 MED ORDER — PREDNISONE 20 MG PO TABS: 40.0000 mg | ORAL_TABLET | Freq: Every day | ORAL | 0 refills | Status: DC

## 2022-03-16 NOTE — Patient Instructions (Signed)
Meds sent to pharmacy  Worse, ER .

## 2022-03-16 NOTE — Progress Notes (Signed)
Subjective:     Patient ID: Ashley Murphy, female    DOB: 1965-07-02, 57 y.o.   MRN: [id number]  Chief Complaint  Patient presents with   Cough    Non productive cough Covid pos last week, completed Paxlovid   Wheezing   Chest Congestion    HPI  Covid + last wk.  Took paxlovid.  Symptoms started 1/27.  Tested + on 1/30. Started paxlovid same day.  Still cough/congestion-getting worse past few days. No f/c.  Some SOB and wheezing.  Dry hacking cough.  H/o asthma.  Not needed meds in yrs. No v/d.    Health Maintenance Due  Topic Date Due   DTaP/Tdap/Td (1 - Tdap) Never done    Past Medical History:  Diagnosis Date   Anemia    Anxiety    Asthma    Family history of colon cancer    Family history of melanoma    Thyroid disease    hypothyroid   Urinary incontinence    not severe    Past Surgical History:  Procedure Laterality Date   ANKLE ARTHROSCOPY WITH RECONSTRUCTION Right    CESAREAN SECTION      Outpatient Medications Prior to Visit  Medication Sig Dispense Refill   celecoxib (CELEBREX) 200 MG capsule Take by mouth 2 (two) times daily.     estradiol (VIVELLE-DOT) 0.0375 MG/24HR Place 1 patch onto the skin 2 (two) times a week. 24 patch 2   hydrocortisone (ANUSOL-HC) 2.5 % rectal cream Place rectally 2 (two) times daily. 30 g 1   levothyroxine (SYNTHROID) 88 MCG tablet TAKE 1 TABLET DAILY BEFORE BREAKFAST 90 tablet 3   Multiple Vitamins-Minerals (MULTIVITAMIN PO) Take by mouth daily.     No facility-administered medications prior to visit.    No Known Allergies ROS neg/noncontributory except as noted HPI/below      Objective:     BP 134/84   Pulse 99   Temp 98.2 F (36.8 C) (Temporal)   Ht 5' 1.81" (1.57 m)   Wt 124 lb 6 oz (56.4 kg)   SpO2 100%   BMI 22.89 kg/m  Wt Readings from Last 3 Encounters:  03/16/22 124 lb 6 oz (56.4 kg)  03/09/22 123 lb (55.8 kg)  12/24/21 121 lb (54.9 kg)    Physical Exam   Gen: WDWN NAD HEENT: NCAT,  conjunctiva not injected, sclera nonicteric TM WNL B, OP moist, no exudates  NECK:  supple, no thyromegaly, no nodes,CARDIAC: RRR, S1S2+, no murmur .LUNGS: CTAB. No wheezes barky cough EXT:  no edema MSK: no gross abnormalities.  NEURO: A&O x3.  CN II-XII intact.  PSYCH: normal mood. Good eye contact     Assessment & Plan:   Problem List Items Addressed This Visit   None Visit Diagnoses     COVID-19    -  Primary   Relevant Medications   azithromycin (ZITHROMAX) 250 MG tablet   Mild intermittent asthma with exacerbation       Relevant Medications   predniSONE (DELTASONE) 20 MG tablet   albuterol (VENTOLIN HFA) 108 (90 Base) MCG/ACT inhaler   albuterol (PROVENTIL) (2.5 MG/3ML) 0.083% nebulizer solution      Covid-s/p paxlovid.  No asthma flaring-see below Exac asthma-has nebulizer.  Will do zpk, nebs albuterol q6h prn or MDI (both sent), tessalon perles '100mg'$  tid prn.  Pred '40mg'$  daily  Meds ordered this encounter  Medications   predniSONE (DELTASONE) 20 MG tablet    Sig: Take 2 tablets (40 mg total)  by mouth daily with breakfast.    Dispense:  14 tablet    Refill:  0   azithromycin (ZITHROMAX) 250 MG tablet    Sig: Take 2 tablets on day 1, then 1 tablet daily on days 2 through 5    Dispense:  6 tablet    Refill:  0   albuterol (VENTOLIN HFA) 108 (90 Base) MCG/ACT inhaler    Sig: Inhale 2 puffs into the lungs every 6 (six) hours as needed for wheezing or shortness of breath.    Dispense:  8 g    Refill:  1    Any brand ok   albuterol (PROVENTIL) (2.5 MG/3ML) 0.083% nebulizer solution    Sig: Take 3 mLs (2.5 mg total) by nebulization every 6 (six) hours as needed for wheezing or shortness of breath.    Dispense:  75 mL    Refill:  1   benzonatate (TESSALON PERLES) 100 MG capsule    Sig: Take 1 capsule (100 mg total) by mouth 3 (three) times daily as needed for cough.    Dispense:  20 capsule    Refill:  0    Wellington Hampshire, MD

## 2022-05-18 DIAGNOSIS — L920 Granuloma annulare: Secondary | ICD-10-CM | POA: Diagnosis not present

## 2022-05-18 DIAGNOSIS — Z85820 Personal history of malignant melanoma of skin: Secondary | ICD-10-CM | POA: Diagnosis not present

## 2022-05-18 DIAGNOSIS — D2271 Melanocytic nevi of right lower limb, including hip: Secondary | ICD-10-CM | POA: Diagnosis not present

## 2022-05-18 DIAGNOSIS — L239 Allergic contact dermatitis, unspecified cause: Secondary | ICD-10-CM | POA: Diagnosis not present

## 2022-06-23 DIAGNOSIS — M5412 Radiculopathy, cervical region: Secondary | ICD-10-CM | POA: Diagnosis not present

## 2022-06-23 DIAGNOSIS — M47812 Spondylosis without myelopathy or radiculopathy, cervical region: Secondary | ICD-10-CM | POA: Diagnosis not present

## 2022-07-01 ENCOUNTER — Ambulatory Visit: Payer: BC Managed Care – PPO | Admitting: Physician Assistant

## 2022-07-02 ENCOUNTER — Encounter: Payer: BC Managed Care – PPO | Admitting: Physician Assistant

## 2022-07-08 ENCOUNTER — Ambulatory Visit (INDEPENDENT_AMBULATORY_CARE_PROVIDER_SITE_OTHER): Payer: BC Managed Care – PPO | Admitting: Physician Assistant

## 2022-07-08 VITALS — BP 104/76 | HR 70 | Temp 98.2°F | Ht 61.42 in | Wt 125.8 lb

## 2022-07-08 DIAGNOSIS — E039 Hypothyroidism, unspecified: Secondary | ICD-10-CM | POA: Diagnosis not present

## 2022-07-08 DIAGNOSIS — E7800 Pure hypercholesterolemia, unspecified: Secondary | ICD-10-CM

## 2022-07-08 DIAGNOSIS — Z0000 Encounter for general adult medical examination without abnormal findings: Secondary | ICD-10-CM

## 2022-07-08 LAB — CBC WITH DIFFERENTIAL/PLATELET
Basophils Absolute: 0 10*3/uL (ref 0.0–0.1)
Basophils Relative: 0.6 % (ref 0.0–3.0)
Eosinophils Absolute: 0 10*3/uL (ref 0.0–0.7)
Eosinophils Relative: 0.5 % (ref 0.0–5.0)
HCT: 39.7 % (ref 36.0–46.0)
Hemoglobin: 13.1 g/dL (ref 12.0–15.0)
Lymphocytes Relative: 24.2 % (ref 12.0–46.0)
Lymphs Abs: 1.2 10*3/uL (ref 0.7–4.0)
MCHC: 32.9 g/dL (ref 30.0–36.0)
MCV: 94.5 fl (ref 78.0–100.0)
Monocytes Absolute: 0.4 10*3/uL (ref 0.1–1.0)
Monocytes Relative: 8.6 % (ref 3.0–12.0)
Neutro Abs: 3.4 10*3/uL (ref 1.4–7.7)
Neutrophils Relative %: 66.1 % (ref 43.0–77.0)
Platelets: 193 10*3/uL (ref 150.0–400.0)
RBC: 4.2 Mil/uL (ref 3.87–5.11)
RDW: 12.6 % (ref 11.5–15.5)
WBC: 5.2 10*3/uL (ref 4.0–10.5)

## 2022-07-08 LAB — COMPREHENSIVE METABOLIC PANEL
ALT: 13 U/L (ref 0–35)
AST: 19 U/L (ref 0–37)
Albumin: 4.8 g/dL (ref 3.5–5.2)
Alkaline Phosphatase: 52 U/L (ref 39–117)
BUN: 11 mg/dL (ref 6–23)
CO2: 27 mEq/L (ref 19–32)
Calcium: 9.5 mg/dL (ref 8.4–10.5)
Chloride: 103 mEq/L (ref 96–112)
Creatinine, Ser: 0.74 mg/dL (ref 0.40–1.20)
GFR: 89.12 mL/min (ref >60.00)
Glucose, Bld: 84 mg/dL (ref 70–99)
Potassium: 4.2 mEq/L (ref 3.5–5.1)
Sodium: 139 mEq/L (ref 135–145)
Total Bilirubin: 0.6 mg/dL (ref 0.2–1.2)
Total Protein: 7.3 g/dL (ref 6.0–8.3)

## 2022-07-08 LAB — LIPID PANEL
Cholesterol: 180 mg/dL (ref 0–200)
HDL: 68.9 mg/dL (ref >39.00)
LDL Cholesterol: 96 mg/dL (ref 0–99)
NonHDL: 111.17
Total CHOL/HDL Ratio: 3
Triglycerides: 78 mg/dL (ref 0.0–149.0)
VLDL: 15.6 mg/dL (ref 0.0–40.0)

## 2022-07-08 LAB — TSH: TSH: 1.02 u[IU]/mL (ref 0.35–5.50)

## 2022-07-08 NOTE — Progress Notes (Signed)
Subjective:    Patient ID: Ashley Murphy, female    DOB: 08-29-1965, 57 y.o.   MRN: [id number]  Chief Complaint  Patient presents with   Annual Exam    Pt in office for CPE and fasting labs, pt forgot to fast but not wanting to come back for labs and would rather do them today if possible. No concerns to discuss other than tongue being numb and pt spoke with pain specialists thinking it was neck related but they didn't think this was related.     HPI Patient is in today for annual exam.  Acute concerns: Tongue is "numb" - the entire tongue - going on a long time, at least a year now; started after COVID-19 infection. Taste is also gone. Salt cravings.   Health maintenance: Lifestyle/ exercise: Doing well Nutrition: Doing well  Mental health: Intermittent stress Sleep: Okay  Substance use: None Sexual activity: Married, monogamous  Colonoscopy: UTD Pap: UTD Mammogram: UTD DEXA: UTD [facility] for granuloma annulare widespread on torso, sometimes feels prickly - trying Plaquenil at this time, on it about 6 weeks now    Past Medical History:  Diagnosis Date   Anemia    Anxiety    Asthma    Family history of colon cancer    Family history of melanoma    Thyroid disease    hypothyroid   Urinary incontinence    not severe    Past Surgical History:  Procedure Laterality Date   ANKLE ARTHROSCOPY WITH RECONSTRUCTION Right    CESAREAN SECTION      Family History  Problem Relation Age of Onset   Arthritis Mother    Cancer Mother 72       likely from reproductive organs    Arthritis Father    Heart disease Father    Hypertension Father    Hyperlipidemia Father    Melanoma Father    Alzheimer's disease Paternal Grandmother    Cancer Paternal Grandfather    Colon cancer Paternal Grandfather    Breast cancer Maternal Great-grandmother    Esophageal cancer Neg Hx    Rectal cancer Neg Hx    Stomach cancer Neg Hx     Social History   Tobacco  Use   Smoking status: Former   Smokeless tobacco: Never   Tobacco comments:    Quit 27 years ago  Substance Use Topics   Alcohol use: Yes    Alcohol/week: 0.0 standard drinks of alcohol    Comment: socially   Drug use: No     No Known Allergies  Review of Systems NEGATIVE UNLESS OTHERWISE INDICATED IN HPI      Objective:     BP 104/76 (BP Location: Left Arm)   Pulse 70   Temp 98.2 F (36.8 C) (Temporal)   Ht 5' 1.42" (1.56 m)   Wt 125 lb 12.8 oz (57.1 kg)   SpO2 97%   BMI 23.45 kg/m   Wt Readings from Last 3 Encounters:  07/08/22 125 lb 12.8 oz (57.1 kg)  03/16/22 124 lb 6 oz (56.4 kg)  03/09/22 123 lb (55.8 kg)    BP Readings from Last 3 Encounters:  07/08/22 104/76  03/16/22 134/84  12/24/21 120/68     Physical Exam Vitals and nursing note reviewed.  Constitutional:      Appearance: Normal appearance. She is normal weight. She is not toxic-appearing.  HENT:     Head: Normocephalic and atraumatic.     Right Ear: Tympanic membrane, ear  canal and external ear normal.     Left Ear: Tympanic membrane, ear canal and external ear normal.     Nose: Nose normal.     Mouth/Throat:     Mouth: Mucous membranes are moist.  Eyes:     Extraocular Movements: Extraocular movements intact.     Conjunctiva/sclera: Conjunctivae normal.     Pupils: Pupils are equal, round, and reactive to light.  Cardiovascular:     Rate and Rhythm: Normal rate and regular rhythm.     Pulses: Normal pulses.     Heart sounds: Normal heart sounds.  Pulmonary:     Effort: Pulmonary effort is normal.     Breath sounds: Normal breath sounds.  Abdominal:     General: Abdomen is flat. Bowel sounds are normal.     Palpations: Abdomen is soft.  Musculoskeletal:        General: Normal range of motion.     Cervical back: Normal range of motion and neck supple.  Skin:    General: Skin is warm and dry.     Findings: Rash (torso - granuloma annulare per derm) present.  Neurological:      General: No focal deficit present.     Mental Status: She is alert and oriented to person, place, and time.  Psychiatric:        Mood and Affect: Mood normal.        Behavior: Behavior normal.        Thought Content: Thought content normal.        Judgment: Judgment normal.        Assessment & Plan:  Encounter for annual physical exam -     CBC with Differential/Platelet -     Comprehensive metabolic panel -     Lipid panel -     TSH  Primary hypothyroidism -     TSH  Elevated LDL cholesterol level -     Lipid panel   Age-appropriate screening and counseling performed today. Will check labs and call with results. Preventive measures discussed and printed in AVS for patient.   Patient Counseling: []   Nutrition: Stressed importance of moderation in sodium/caffeine intake, saturated fat and cholesterol, caloric balance, sufficient intake of fresh fruits, vegetables, and fiber.  [x]   Stressed the importance of regular exercise.   []   Substance Abuse: Discussed cessation/primary prevention of tobacco, alcohol, or other drug use; driving or other dangerous activities under the influence; availability of treatment for abuse.   []   Injury prevention: Discussed safety belts, safety helmets, smoke detector, smoking near bedding or upholstery.   []   Sexuality: Discussed sexually transmitted diseases, partner selection, use of condoms, avoidance of unintended pregnancy  and contraceptive alternatives.   [x]   Dental health: Discussed importance of regular tooth brushing, flossing, and dental visits.  [x]   Health maintenance and immunizations reviewed. Please refer to Health maintenance section.         Return in about 1 year (around 07/08/2023) for physical, fasting labs .    Ashley Holstrom M Markees Carns, PA-C

## 2022-07-22 ENCOUNTER — Ambulatory Visit (INDEPENDENT_AMBULATORY_CARE_PROVIDER_SITE_OTHER): Payer: BC Managed Care – PPO | Admitting: Nurse Practitioner

## 2022-07-22 ENCOUNTER — Encounter: Payer: Self-pay | Admitting: Nurse Practitioner

## 2022-07-22 VITALS — BP 102/76 | HR 83 | Temp 98.9°F | Ht 61.42 in | Wt 123.4 lb

## 2022-07-22 DIAGNOSIS — J0140 Acute pansinusitis, unspecified: Secondary | ICD-10-CM

## 2022-07-22 MED ORDER — PROMETHAZINE-DM 6.25-15 MG/5ML PO SYRP: 5.0000 mL | ORAL_SOLUTION | Freq: Four times a day (QID) | ORAL | 0 refills | Status: DC | PRN

## 2022-07-22 MED ORDER — AMOXICILLIN-POT CLAVULANATE 875-125 MG PO TABS: 1.0000 | ORAL_TABLET | Freq: Two times a day (BID) | ORAL | 0 refills | Status: DC

## 2022-07-22 NOTE — Assessment & Plan Note (Addendum)
Patient presents with productive cough for the last 2 weeks. Denies being around any sick contacts. Patient is now having chills,body aches, and fatigue that started yesterday. She denies fever. She has been taking sudafed for management however symptoms are progressing. Prescribed antibiotic Augmentin 875-125mg  tablet and Promethazine -DM 6.25-15 mg for cough. Drink plenty of fluids and get plenty of rest. Follow-up if symptoms do not improve or worsen.

## 2022-07-22 NOTE — Patient Instructions (Signed)
It was great to see you!  Start augmentin twice a day for 10 days.   You can take the cough syrup every 4 hours as needed, this may make you sleepy  Get plenty of rest and fluids.   Let's follow-up if your symptoms worsen or don't improve   Take care,  Rodman Pickle, NP

## 2022-07-22 NOTE — Progress Notes (Signed)
Acute Office Visit  Subjective:     Patient ID: Ashley Murphy, female    DOB: [birth year], 57 y.o.   MRN: [id number]  Chief Complaint  Patient presents with   Cough    Coughing for two weeks, body aches for 1 day, chills, fatigue    HPI:  Patient is in today for a cough that presented 2 weeks ago and body aches and chills with fatigue that started 1 day ago. Patient reveals she has had a cough with sore throat that progressively started to worsen. She has a congested productive cough. She describes the mucus as thin and clear. She denies being around any sick contacts however [facility] She states it hurts when she coughs. She is experiencing a stuffy head causing her to have constant headache pressure. She states her ears hurt bilaterally. She shares her ears feels clogged from all the coughing. She has taken sudafed that helps some however her symptoms are still persistent. She denies  fever, shortness of breath and chest pain.   Review of Systems  Constitutional:  Positive for chills and malaise/fatigue.  HENT:  Positive for congestion, ear pain, hearing loss and sore throat.   Eyes:  Negative for discharge and redness.  Respiratory:  Positive for cough and sputum production. Negative for shortness of breath and wheezing.   Cardiovascular:  Negative for chest pain and palpitations.  Gastrointestinal:  Negative for abdominal pain, nausea and vomiting.  Genitourinary:  Negative for dysuria and flank pain.  Musculoskeletal:  Negative for myalgias.  Skin: Negative.   Neurological:  Positive for headaches.  Psychiatric/Behavioral: Negative.          Objective:    BP 102/76 (BP Location: Left Arm)   Pulse 83   Temp 98.9 F (37.2 C) (Oral)   Ht 5' 1.42" (1.56 m)   Wt 123 lb 6.4 oz (56 kg)   SpO2 99%   BMI 23.00 kg/m    Physical Exam Constitutional:      General: She is not in acute distress.    Appearance: Normal appearance. She is  normal weight. She is not ill-appearing.  HENT:     Head: Normocephalic and atraumatic.     Right Ear: Tympanic membrane, ear canal and external ear normal.     Ears:     Comments: Mild redness in left ear canal and tympanic membrane.     Nose: Congestion and rhinorrhea present.     Mouth/Throat:     Mouth: Mucous membranes are moist.     Pharynx: Posterior oropharyngeal erythema present.  Eyes:     General:        Right eye: No discharge.        Left eye: No discharge.  Cardiovascular:     Rate and Rhythm: Normal rate and regular rhythm.     Pulses: Normal pulses.     Heart sounds: Normal heart sounds. No murmur heard.    No friction rub. No gallop.  Pulmonary:     Effort: Pulmonary effort is normal. No respiratory distress.     Breath sounds: Rhonchi present. No wheezing.  Abdominal:     General: Abdomen is flat.     Palpations: Abdomen is soft.  Musculoskeletal:        General: Normal range of motion.     Cervical back: No tenderness.  Lymphadenopathy:     Cervical: No cervical adenopathy.  Skin:    General: Skin is warm and dry.  Neurological:     Mental Status: She is alert and oriented to person, place, and time.     Motor: Weakness present.  Psychiatric:        Mood and Affect: Mood normal.        Behavior: Behavior normal.        Thought Content: Thought content normal.        Judgment: Judgment normal.     No results found for any visits on 07/22/22.      Assessment & Plan:   Problem List Items Addressed This Visit       Respiratory   Acute non-recurrent pansinusitis - Primary    Patient presents with productive cough for the last 2 weeks. Denies being around any sick contacts. Patient is now having chills,body aches, and fatigue that started yesterday. She denies fever. She has been taking sudafed for management however symptoms are progressing. Prescribed antibiotic Augmentin 875-125mg  tablet and Promethazine -DM 6.25-15 mg for cough. Drink plenty of  fluids and get plenty of rest. Follow-up if symptoms do not improve or worsen.       Relevant Medications   amoxicillin-clavulanate (AUGMENTIN) 875-125 MG tablet   promethazine-dextromethorphan (PROMETHAZINE-DM) 6.25-15 MG/5ML syrup    Meds ordered this encounter  Medications   amoxicillin-clavulanate (AUGMENTIN) 875-125 MG tablet    Sig: Take 1 tablet by mouth 2 (two) times daily.    Dispense:  20 tablet    Refill:  0   promethazine-dextromethorphan (PROMETHAZINE-DM) 6.25-15 MG/5ML syrup    Sig: Take 5 mLs by mouth 4 (four) times daily as needed for cough.    Dispense:  118 mL    Refill:  0    No follow-ups on file.  Bishop Dublin, RN

## 2022-08-05 ENCOUNTER — Other Ambulatory Visit: Payer: Self-pay | Admitting: Physician Assistant

## 2022-08-05 DIAGNOSIS — E039 Hypothyroidism, unspecified: Secondary | ICD-10-CM

## 2022-09-03 DIAGNOSIS — Z1231 Encounter for screening mammogram for malignant neoplasm of breast: Secondary | ICD-10-CM | POA: Diagnosis not present

## 2022-09-10 ENCOUNTER — Ambulatory Visit (INDEPENDENT_AMBULATORY_CARE_PROVIDER_SITE_OTHER): Payer: BC Managed Care – PPO | Admitting: Radiology

## 2022-09-10 ENCOUNTER — Other Ambulatory Visit (HOSPITAL_COMMUNITY)
Admission: RE | Admit: 2022-09-10 | Discharge: 2022-09-10 | Disposition: A | Discharge status: Home / Self Care | Payer: BC Managed Care – PPO | Source: Ambulatory Visit | Attending: Radiology | Admitting: Radiology

## 2022-09-10 ENCOUNTER — Encounter: Payer: Self-pay | Admitting: Radiology

## 2022-09-10 VITALS — BP 112/70 | Ht 61.25 in | Wt 127.0 lb

## 2022-09-10 DIAGNOSIS — Z79890 Hormone replacement therapy (postmenopausal): Secondary | ICD-10-CM

## 2022-09-10 DIAGNOSIS — Z01419 Encounter for gynecological examination (general) (routine) without abnormal findings: Secondary | ICD-10-CM | POA: Insufficient documentation

## 2022-09-10 MED ORDER — ESTRADIOL 0.0375 MG/24HR TD PTTW: 1.0000 | MEDICATED_PATCH | TRANSDERMAL | 4 refills | Status: DC

## 2022-09-10 MED ORDER — PROGESTERONE MICRONIZED 100 MG PO CAPS
100.0000 mg | ORAL_CAPSULE | Freq: Every day | ORAL | 4 refills | Status: DC
Start: 2022-09-10 — End: 2023-09-14

## 2022-09-10 NOTE — Progress Notes (Signed)
   Ashley Murphy [birth year] [id number]   History: Postmenopausal 57 y.o. presents for annual exam. Using vivelle dot for HRT, doing well on current dose. IUD for endometrial protection. Still having trouble sleeping. Otherwise, doing well.    Gynecologic History Postmenopausal Last Pap: 2021. Results were: normal Last mammogram: 09/03/22. Results were: normal Last colonoscopy: 2017   Obstetric History OB History  Gravida Para Term Preterm AB Living  2 2   2   2   SAB IAB Ectopic Multiple Live Births          2    # Outcome Date GA Lbr Len/2nd Weight Sex Type Anes PTL Lv  2 Preterm     M VBAC  Y LIV  1 Preterm     M CS-Unspec   LIV     The following portions of the patient's history were reviewed and updated as appropriate: allergies, current medications, past family history, past medical history, past social history, past surgical history, and problem list.  Review of Systems Pertinent items noted in HPI and remainder of comprehensive ROS otherwise negative.  Past medical history, past surgical history, family history and social history were all reviewed and documented in the EPIC chart.  Exam:  Vitals:   09/10/22 0822  BP: 112/70  Weight: 127 lb (57.6 kg)  Height: 5' 1.25" (1.556 m)   Body mass index is 23.8 kg/m.  General appearance:  Normal Thyroid:  Symmetrical, normal in size, without palpable masses or nodularity. Respiratory  Auscultation:  Clear without wheezing or rhonchi Cardiovascular  Auscultation:  Regular rate, without rubs, murmurs or gallops  Edema/varicosities:  Not grossly evident Abdominal  Soft,nontender, without masses, guarding or rebound.  Liver/spleen:  No organomegaly noted  Hernia:  None appreciated  Skin  Inspection:  Grossly normal Breasts: Examined lying and sitting.   Right: Without masses, retractions, nipple discharge or axillary adenopathy.   Left: Without masses, retractions, nipple discharge or axillary  adenopathy. Genitourinary   Inguinal/mons:  Normal without inguinal adenopathy  External genitalia:  Normal appearing vulva with no masses, tenderness, or lesions  BUS/Urethra/Skene's glands:  Normal  Vagina:  Normal appearing with normal color and discharge, no lesions. Atrophy: mild   Cervix:  Normal appearing without discharge or lesions. IUD strings seen  Uterus:  Normal in size, shape and contour.  Midline and mobile, nontender  Adnexa/parametria:     Rt: Normal in size, without masses or tenderness.   Lt: Normal in size, without masses or tenderness.  Anus and perineum: Normal    Raynelle Fanning, CMA present for exam  Assessment/Plan:   1. Well woman exam with routine gynecological exam - Cytology - PAP( [place])  2. Hormone replacement therapy Will add progesterone to see if it improves her sleep Will leave Mirena for now - estradiol (VIVELLE-DOT) 0.0375 MG/24HR; Place 1 patch onto the skin 2 (two) times a week.  Dispense: 24 patch; Refill: 4 - progesterone (PROMETRIUM) 100 MG capsule; Take 1 capsule (100 mg total) by mouth daily.  Dispense: 90 capsule; Refill: 4    Discussed SBE, colonoscopy and DEXA screening as directed. Recommend of exercise weekly, including weight bearing exercise. Encouraged the use of seatbelts and sunscreen.  Return in 1 year for annual or sooner prn.  Arlie Solomons B WHNP-BC, 8:45 AM 09/10/2022

## 2022-11-15 DIAGNOSIS — L821 Other seborrheic keratosis: Secondary | ICD-10-CM | POA: Diagnosis not present

## 2022-11-15 DIAGNOSIS — L570 Actinic keratosis: Secondary | ICD-10-CM | POA: Diagnosis not present

## 2022-11-15 DIAGNOSIS — Z85820 Personal history of malignant melanoma of skin: Secondary | ICD-10-CM | POA: Diagnosis not present

## 2022-11-15 DIAGNOSIS — L920 Granuloma annulare: Secondary | ICD-10-CM | POA: Diagnosis not present

## 2022-11-15 DIAGNOSIS — L720 Epidermal cyst: Secondary | ICD-10-CM | POA: Diagnosis not present

## 2023-03-07 ENCOUNTER — Ambulatory Visit: Payer: Self-pay | Admitting: Physician Assistant
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
## 2023-03-08 ENCOUNTER — Ambulatory Visit (INDEPENDENT_AMBULATORY_CARE_PROVIDER_SITE_OTHER): Payer: BC Managed Care – PPO | Admitting: Physician Assistant

## 2023-03-08 ENCOUNTER — Ambulatory Visit: Payer: BC Managed Care – PPO

## 2023-03-08 VITALS — BP 126/80 | HR 71 | Temp 97.9°F | Ht 62.0 in | Wt 127.8 lb

## 2023-03-08 DIAGNOSIS — M5136 Other intervertebral disc degeneration, lumbar region with discogenic back pain only: Secondary | ICD-10-CM | POA: Diagnosis not present

## 2023-03-08 DIAGNOSIS — M545 Low back pain, unspecified: Secondary | ICD-10-CM | POA: Diagnosis not present

## 2023-03-08 DIAGNOSIS — M5126 Other intervertebral disc displacement, lumbar region: Secondary | ICD-10-CM | POA: Diagnosis not present

## 2023-03-08 DIAGNOSIS — M419 Scoliosis, unspecified: Secondary | ICD-10-CM | POA: Diagnosis not present

## 2023-03-08 DIAGNOSIS — R21 Rash and other nonspecific skin eruption: Secondary | ICD-10-CM

## 2023-03-08 DIAGNOSIS — M48061 Spinal stenosis, lumbar region without neurogenic claudication: Secondary | ICD-10-CM | POA: Diagnosis not present
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
## 2023-03-18 ENCOUNTER — Encounter: Payer: Self-pay | Admitting: Physician Assistant

## 2023-03-18 ENCOUNTER — Other Ambulatory Visit: Payer: Self-pay

## 2023-03-18 DIAGNOSIS — M545 Low back pain, unspecified: Secondary | ICD-10-CM

## 2023-03-28 DIAGNOSIS — M542 Cervicalgia: Secondary | ICD-10-CM | POA: Diagnosis not present

## 2023-03-28 DIAGNOSIS — M25551 Pain in right hip: Secondary | ICD-10-CM | POA: Diagnosis not present

## 2023-03-28 DIAGNOSIS — M5412 Radiculopathy, cervical region: Secondary | ICD-10-CM | POA: Diagnosis not present

## 2023-03-28 DIAGNOSIS — M5416 Radiculopathy, lumbar region: Secondary | ICD-10-CM | POA: Diagnosis not present

## 2023-04-20 NOTE — Therapy (Signed)
 OUTPATIENT PHYSICAL THERAPY THORACOLUMBAR EVALUATION   Patient Name: Ashley Murphy MRN: [id number] DOB:Apr 24, 1965, 58 y.o., female Today's Date: 04/20/2023  END OF SESSION:   Past Medical History:  Diagnosis Date   Anemia    Anxiety    Asthma    Family history of colon cancer    Family history of melanoma    Thyroid disease    hypothyroid   Urinary incontinence    not severe   Past Surgical History:  Procedure Laterality Date   ANKLE ARTHROSCOPY WITH RECONSTRUCTION Right    CESAREAN SECTION     Patient Active Problem List   Diagnosis Date Noted   Acute non-recurrent pansinusitis 07/22/2022   Encounter for annual physical exam 07/08/2022   Elevated LDL cholesterol level 07/08/2022   Family history of melanoma 09/22/2020   Family history of colon cancer 09/22/2020   Granuloma annulare 06/10/2017   Melanoma of skin (HCC) 06/10/2017   Ankle fracture, right 07/28/2015   Primary hypothyroidism 07/28/2015   Restless legs syndrome (RLS) 07/28/2015   Extrinsic asthma 07/28/2015    PCP: Bary Leriche, PA-C  REFERRING PROVIDER: Bedelia Person, MD  REFERRING DIAG: M54.2 (ICD-10-CM) - Cervicalgia  Rationale for Evaluation and Treatment: Rehabilitation  THERAPY DIAG:  No diagnosis found.  ONSET DATE: ***  SUBJECTIVE:                                                                                                                                                                                           SUBJECTIVE STATEMENT: ***  PERTINENT HISTORY:  anxiety, asthma, hx incontinence, skin melanoma  PAIN:  Are you having pain: *** Location/description: *** Best-worst over past week: ***  - aggravating factors: *** - Easing factors: ***    PRECAUTIONS: {Therapy precautions:24002}  RED FLAGS: {PT Red Flags:29287}   WEIGHT BEARING RESTRICTIONS: {Yes ***/No:24003}  FALLS:  Has patient fallen in last 6 months? {fallsyesno:27318}  LIVING  ENVIRONMENT: Lives with: {[facility] lives with:25569::"lives with their family"} Lives in: {Lives in:25570} Stairs: {opstairs:27293} Has following equipment at home: {Assistive devices:23999}  OCCUPATION: ***  PLOF: {PLOF:24004}  PATIENT GOALS: ***  NEXT MD VISIT: ***  OBJECTIVE:  Note: Objective measures were completed at Evaluation unless otherwise noted.  DIAGNOSTIC FINDINGS:  03/08/23 Lumbar XR "IMPRESSION: 1. Degenerative disc disease at L3-L4. 2. Trace retrolisthesis of L2 on L3 and L3 on L4."  PATIENT SURVEYS:  Modified Oswestry ***   COGNITION: Overall cognitive status: Within functional limits for tasks assessed     SENSATION: {sensation:27233}  MUSCLE LENGTH: Hamstrings: Right *** deg; Left *** deg Maisie Fus test: Right *** deg; Left *** deg  POSTURE: {  posture:25561}  PALPATION: ***  LUMBAR ROM:   AROM eval  Flexion   Extension   Right lateral flexion   Left lateral flexion   Right rotation   Left rotation    (Blank rows = not tested) (Key: WFL = within functional limits not formally assessed, * = concordant pain, s = stiffness/stretching sensation, NT = not tested) Comments:   LOWER EXTREMITY ROM:     {AROM/PROM:27142}  Right eval Left eval  Hip flexion    Hip extension    Hip internal rotation    Hip external rotation    Knee extension    Knee flexion    (Blank rows = not tested) (Key: WFL = within functional limits not formally assessed, * = concordant pain, s = stiffness/stretching sensation, NT = not tested)  Comments:    LOWER EXTREMITY MMT:    MMT Right eval Left eval  Hip flexion    Hip abduction (modified sitting)    Hip internal rotation    Hip external rotation    Knee flexion    Knee extension    Ankle dorsiflexion     (Blank rows = not tested) (Key: WFL = within functional limits not formally assessed, * = concordant pain, s = stiffness/stretching sensation, NT = not tested)  Comments:    LUMBAR SPECIAL TESTS:   {lumbar special test:25242}  FUNCTIONAL TESTS:  {Functional tests:24029}  GAIT: Distance walked: *** Assistive device utilized: {Assistive devices:23999} Level of assistance: {Levels of assistance:24026} Comments: ***  TREATMENT DATE:  [facility] Adult PT Treatment:                                                DATE: 04/21/23 Therapeutic Exercise: *** Manual Therapy: *** Neuromuscular re-ed: *** Therapeutic Activity: *** Modalities: *** Self Care: ***                                                                                                                                  PATIENT EDUCATION:  Education details: Pt education on PT impairments, prognosis, and POC. Informed consent. Rationale for interventions, safe/appropriate HEP performance Person educated: Patient Education method: Explanation, Demonstration, Tactile cues, Verbal cues Education comprehension: verbalized understanding, returned demonstration, verbal cues required, tactile cues required, and needs further education    HOME EXERCISE PROGRAM: ***  ASSESSMENT:  CLINICAL IMPRESSION: Patient is a 58 y.o. woman who was seen today for physical therapy evaluation and treatment for neck pain. ***    OBJECTIVE IMPAIRMENTS: {opptimpairments:25111}.   ACTIVITY LIMITATIONS: {activitylimitations:27494}  PARTICIPATION LIMITATIONS: {participationrestrictions:25113}  PERSONAL FACTORS: {Personal factors:25162} are also affecting patient's functional outcome.   REHAB POTENTIAL: {rehabpotential:25112}  CLINICAL DECISION MAKING: {clinical decision making:25114}  EVALUATION COMPLEXITY: {Evaluation complexity:25115}   GOALS: Goals reviewed with patient? {yes/no:20286}  SHORT TERM GOALS: Target date: ***  Pt will demonstrate appropriate understanding and performance of initially prescribed HEP in order to facilitate improved independence with management of symptoms.  Baseline: HEP ***  Goal status: INITIAL    2. Pt will report at least 25% improvement in overall pain levels over past week in order to facilitate improved tolerance to typical daily activities.   Baseline: ***  Goal status: INITIAL    LONG TERM GOALS: Target date: *** Pt will improve at least 20% on ODI in order to demonstrate improved perception of functional status due to symptoms.  Baseline: *** Goal status: INITIAL  2.  Pt will demonstrate *** lumbar AROM in order to demonstrate improved tolerance to functional movement patterns.   Baseline: *** Goal status: INITIAL  3.  Pt will demonstrate hip MMT of *** in order to demonstrate improved strength for functional movements.  Baseline: *** Goal status: INITIAL  4. Pt will perform 5xSTS in <*** sec in order to demonstrate reduced fall risk and improved functional independence. (MCID of 2.3sec)  Baseline: ***  Goal status: INITIAL   PLAN:  PT FREQUENCY: {rehab frequency:25116}  PT DURATION: {rehab duration:25117}  PLANNED INTERVENTIONS: {rehab planned interventions:25118::"97110-Therapeutic exercises","97530- Therapeutic [phone]- Neuromuscular re-education","97535- Self JXBJ","47829- Manual therapy"}.  PLAN FOR NEXT SESSION: Review/update HEP PRN. Work on [employer] exercises as appropriate with emphasis on ***. Symptom modification strategies as indicated/appropriate.    Ashley Murrain PT, DPT 04/20/2023 4:24 PM

## 2023-04-21 ENCOUNTER — Encounter (HOSPITAL_BASED_OUTPATIENT_CLINIC_OR_DEPARTMENT_OTHER): Payer: Self-pay | Admitting: Physical Therapy

## 2023-04-21 ENCOUNTER — Ambulatory Visit (HOSPITAL_BASED_OUTPATIENT_CLINIC_OR_DEPARTMENT_OTHER): Payer: BC Managed Care – PPO | Attending: Neurosurgery | Admitting: Physical Therapy

## 2023-04-21 ENCOUNTER — Other Ambulatory Visit: Payer: Self-pay

## 2023-04-21 DIAGNOSIS — M5459 Other low back pain: Secondary | ICD-10-CM | POA: Insufficient documentation

## 2023-04-21 DIAGNOSIS — M542 Cervicalgia: Secondary | ICD-10-CM | POA: Insufficient documentation

## 2023-04-26 ENCOUNTER — Encounter (HOSPITAL_BASED_OUTPATIENT_CLINIC_OR_DEPARTMENT_OTHER): Payer: Self-pay | Admitting: Physical Therapy

## 2023-04-26 ENCOUNTER — Ambulatory Visit (HOSPITAL_BASED_OUTPATIENT_CLINIC_OR_DEPARTMENT_OTHER): Admitting: Physical Therapy

## 2023-04-26 DIAGNOSIS — M5459 Other low back pain: Secondary | ICD-10-CM

## 2023-04-26 DIAGNOSIS — M542 Cervicalgia: Secondary | ICD-10-CM | POA: Diagnosis not present

## 2023-04-26 NOTE — Therapy (Signed)
 OUTPATIENT PHYSICAL THERAPY TREATMENT   Patient Name: Ashley Murphy MRN: [id number] DOB:1965/07/30, 58 y.o., female Today's Date: 04/26/2023  END OF SESSION:  PT End of Session - 04/26/23 0802     Visit Number 2    Number of Visits 17    Date for PT Re-Evaluation 06/16/23    Authorization Type [employer]    PT Start Time 0802    PT Stop Time 0842    PT Time Calculation (min) 40 min    Activity Tolerance Patient tolerated treatment well             Past Medical History:  Diagnosis Date   Anemia    Anxiety    Asthma    Family history of colon cancer    Family history of melanoma    Thyroid disease    hypothyroid   Urinary incontinence    not severe   Past Surgical History:  Procedure Laterality Date   ANKLE ARTHROSCOPY WITH RECONSTRUCTION Right    CESAREAN SECTION     Patient Active Problem List   Diagnosis Date Noted   Acute non-recurrent pansinusitis 07/22/2022   Encounter for annual physical exam 07/08/2022   Elevated LDL cholesterol level 07/08/2022   Family history of melanoma 09/22/2020   Family history of colon cancer 09/22/2020   Granuloma annulare 06/10/2017   Melanoma of skin (HCC) 06/10/2017   Ankle fracture, right 07/28/2015   Primary hypothyroidism 07/28/2015   Restless legs syndrome (RLS) 07/28/2015   Extrinsic asthma 07/28/2015    PCP: Bary Leriche, PA-C  REFERRING PROVIDER: Bedelia Person, MD  REFERRING DIAG: M54.2 (ICD-10-CM) - Cervicalgia  Rationale for Evaluation and Treatment: Rehabilitation  THERAPY DIAG:  Cervicalgia  Other low back pain  ONSET DATE: neck pain for a few years, back pain since August 2024  SUBJECTIVE:                                                                                                                                                                                           SUBJECTIVE STATEMENT: Patient states HEP is going well. Feel them pull when she does the stretches.    EVAL: Pt  arrives w/ referral for cervicalgia although paper referral also mentions PT for low back.  Pt states neck has been on ongoing issue for several years, feels relatively stable although it does limit her. States she has had PT in the past which improved mobility some, has continued with some of her exercises. Pt states her back is a newer issue and her more pressing concern. States it began last year, initially fluctuating. States towards the end of  last year she was on a boat that had some heavy jolting while hitting wakes and seemed to consistently worsen afterwards. She is now having increased difficulty w/ majority of movement and prolonged positioning. She states most of her pain is around belt line R>L but will sometimes radiate superiorly or into hips. She notes that neck and back both tend to have better tolerance if she is in "just the right spot". States she feels guarded when she moves/walks, historically has been fairly active but has limited activity due to pain. Occasional pain/weakness in BIL LE, R>L. Occasional N/T as well. She denies any saddle anesthesia or sensory changes. Has known history of mild urinary incontinence but states this is stable and unchanging.    PERTINENT HISTORY:  anxiety, asthma, hx incontinence  PAIN:  Are you having pain: 2/10 neck, 4/10 back Location/description: neck (upper neck, R>L) pulling and tight feeling ; R>L belt line, occasionally goes up and has random pains down legs Best-worst over past week: neck 1-5/10 ; back 3-9/10 - aggravating factors: sitting ; looking up ; bending ; lower body dressing ; standing > a couple minutes ; walking inclines/declines - Easing factors: rest breaking, stretching    PRECAUTIONS: None   WEIGHT BEARING RESTRICTIONS: No  FALLS:  Has patient fallen in last 6 months? No  LIVING ENVIRONMENT: 2 story home, main level livable ; some difficulty with stair navigation (d/t ankle issues and back pain) has rails at  home Lives w/ husband and son Housework split - pt states she usually does typical cleaning, cooking  OCCUPATION: works as Emergency planning/management officer, mostly in office, lots of sitting   PLOF: Independent  PATIENT GOALS: feel better, particularly with her back   NEXT MD VISIT: mid April   OBJECTIVE:  Note: Objective measures were completed at Evaluation unless otherwise noted.  DIAGNOSTIC FINDINGS:  03/08/23 Lumbar XR "IMPRESSION: 1. Degenerative disc disease at L3-L4. 2. Trace retrolisthesis of L2 on L3 and L3 on L4."  PATIENT SURVEYS:  NDI: 28/50 ; 56%   COGNITION: Overall cognitive status: Within functional limits for tasks assessed     SENSATION/NEURO: Light touch intact BIL LE No clonus either LE Negative hoffmann and tromner sign BIL No ataxia with gait   POSTURE: increased lumbar lordosis, fwd head posture, R UT more elevated than L and mild lateral shift towards L   CERVICAL ROM:    A/PROM  eval  Flexion 100%  Extension 75% s  Right lateral flexion 50% R sided pain  Left lateral flexion 50% R sided pain  Right rotation 43 deg  Left rotation 55 deg   (Blank rows = not tested) (Key: WFL = within functional limits not formally assessed, * = concordant pain, s = stiffness/stretching sensation, NT = not tested)  Comments:    LUMBAR ROM:   AROM eval  Flexion Able to touch feet with relieving R sided stretch   Extension 50% *   Right lateral flexion Just above knee, painless   Left lateral flexion Mid thigh R sided pain   Right rotation 100% soreness   Left rotation 100% soreness    (Blank rows = not tested) (Key: WFL = within functional limits not formally assessed, * = concordant pain, s = stiffness/stretching sensation, NT = not tested) Comments: flexion is relieving in clinic today although pt notes oftentimes returning to neutral from flexion is painful  RANGE OF MOTION:      Right eval Left eval  Shoulder flexion    Functional  ER combo     Functional IR combo    Knee extension    Ankle dorsiflexion     (Blank rows = not tested) (Key: WFL = within functional limits not formally assessed, * = concordant pain, s = stiffness/stretching sensation, NT = not tested)  Comments: full shoulder flex/abd ROM without pain   STRENGTH TESTING:  MMT Right eval Left eval  Shoulder flexion 5 5  Shoulder abduction 5 5  Shoulder extension 5 5  Elbow flexion    Elbow extension    Grip strength (gross)    Hip flexion 4 4 *  Hip abduction (modified sitting) 4 4  Knee flexion 4 4  Knee extension 4+ 4+  Ankle dorsiflexion 4+ 4+  Ankle plantarflexion     (Blank rows = not tested) (Key: WFL = within functional limits not formally assessed, * = concordant pain, s = stiffness/stretching sensation, NT = not tested)  Comments:     FUNCTIONAL TESTS:  5xSTS: 20.08sec no UE support; rigid trunk mechanics with reduced fwd weightshifting  GAIT: Distance walked: within clinic Assistive device utilized: None Level of assistance: Complete Independence Comments: reduced truncal rotation and arm swing, mildly reduced cadence  TREATMENT DATE:  04/26/23 POE  1 minute Press up 2 x 10 Standing lumbar extension 2 x 10 Manual: STM lower lumbar paraspinals, grade II CPA L4-5 PPT 10 x 5 second holds PPT with march 2 x 10 LTR 10 x 5-10 second holds Bridge 2 x 10 Sidelying hip abduction 2 x 10 Prone hip extension 1 x 10   [facility] Adult PT Treatment:                                                DATE: 04/21/23 Therapeutic Exercise: Pelvic tilts, marches, and cervical rotation in supine practice repetitions ; HEP handout + education ; time spent w/ education on relevant anatomy/physiology and rationale for interventions as they pertain to impairments found on exam                                                                                          PATIENT EDUCATION:  Education details: Pt education on PT impairments, prognosis, and POC.  Informed consent. Rationale for interventions, safe/appropriate HEP performance 04/26/23 HEP, lumbar roll Person educated: Patient Education method: Explanation, Demonstration, Tactile cues, Verbal cues Education comprehension: verbalized understanding, returned demonstration, verbal cues required, tactile cues required, and needs further education    HOME EXERCISE PROGRAM: Access Code: [email or web address] URL: [email or web address] Date: 04/21/2023 - Supine Posterior Pelvic Tilt  - 2-3 x daily - 1 sets - 8 reps - Supine March  - 2-3 x daily - 1 sets - 8 reps - Supine Cervical Rotation AROM on Pillow  - 2-3 x daily - 1 sets - 8 reps  04/26/23 - Standing Lumbar Extension  - 3 x daily - 7 x weekly - 1-2 sets - 10 reps - Seated Correct Posture  - 1 x daily - 7 x weekly - Supine Lower  Trunk Rotation  - 1 x daily - 7 x weekly - 10 reps - 5-10 second hold - Supine Bridge  - 1 x daily - 7 x weekly - 2 sets - 10 reps  ASSESSMENT:  CLINICAL IMPRESSION: Patient with good stretching in back with press up, c/o soreness following. Manual to hyperactive and hypomobile lumbar spine with improvement following in both resistance and symptoms. Educated on lumbar roll. Continued with core and glute strengthening as well as lumbar mobility which are tolerated well. Educated on possible muscle soreness. Patient will continue to benefit from physical therapy in order to improve function and reduce impairment.   Eval: Patient is a pleasant 58 y.o. woman who was seen today for physical therapy evaluation and treatment for neck/back pain. The former is chronic in nature and pt states is limiting, but has been stable for past few years. She states back pain is newer and more limiting, tends to affect majority of daily tasks/mobility and has culminated in reduced activity levels. On exam she demonstrates concordant limitations in cervical and lumbar mobility, reduced LE strength, and altered transfer mechanics. 5xSTS  is indicative of fall risk and reduced functional mobility. Red flag screening reassuring today. Pt tolerates exam/HEP well overall without adverse event or increase in pain. Recommend trial of skilled PT to address aforementioned deficits with aim of improving functional tolerance and reducing pain with typical activities. Pt departs today's session in no acute distress, all voiced concerns/questions addressed appropriately from PT perspective.    OBJECTIVE IMPAIRMENTS: decreased activity tolerance, decreased endurance, decreased mobility, difficulty walking, decreased ROM, decreased strength, impaired perceived functional ability, improper body mechanics, postural dysfunction, and pain.   ACTIVITY LIMITATIONS: carrying, lifting, bending, sitting, standing, squatting, sleeping, transfers, dressing, and locomotion level  PARTICIPATION LIMITATIONS: meal prep, cleaning, laundry, driving, community activity, and occupation  PERSONAL FACTORS: Time since onset of injury/illness/exacerbation and 1-2 comorbidities: anxiety, asthma  are also affecting patient's functional outcome.   REHAB POTENTIAL: Good  CLINICAL DECISION MAKING: Evolving/moderate complexity  EVALUATION COMPLEXITY: Moderate   GOALS:  SHORT TERM GOALS: Target date: 05/19/2023  Pt will demonstrate appropriate understanding and performance of initially prescribed HEP in order to facilitate improved independence with management of symptoms.  Baseline: HEP established  Goal status: INITIAL   2. Pt will report at least 25% improvement in overall pain levels over past week in order to facilitate improved tolerance to typical daily activities.   Baseline: neck 1-5/10 ; back 3-9/10   Goal status: INITIAL    LONG TERM GOALS: Target date: 06/16/2023   Pt will score less than or equal to 15/50 on NDI in order to demonstrate improved perception of function due to symptoms (MDC 10-13 pts per Loma Sender al 2009, 2010). Baseline: 28/50 Goal  status: INITIAL   2.  Pt will demonstrate full/painless lumbar sidebending/extension AROM in order to demonstrate improved tolerance to functional movement patterns.   Baseline: see ROM chart above Goal status: INITIAL  3.  Pt will demonstrate hip flex/abd MMT of at least 4+/5 bilaterally in order to demonstrate improved strength for functional movements.  Baseline: see MMT chart above Goal status: INITIAL  4. Pt will perform 5xSTS in <12 sec in order to demonstrate reduced fall risk and improved functional independence. (MCID of 2.3sec)  Baseline: 20sec  Goal status: INITIAL   5. Pt will report at least 50% decrease in overall pain levels in past week in order to facilitate improved tolerance to basic ADLs/mobility.   Baseline: neck 1-5/10 ;  back 3-9/10   Goal status: INITIAL    6. Pt will endorse ability to perform lower body dressing with less than 3 pt increase in pain in order to facilitate improved tolerance to ADLs.  Baseline: inc pain w/ LBD  Goal status: INITIAL  7. Pt will endorse ability to stand/walk for >52min with no more than 1 rest break in order to facilitate improved tolerance to household tasks.  Baseline: inc pain w/ standing > a couple minutes  Goal status: INITIAL   PLAN:  PT FREQUENCY: 1-2x/week  PT DURATION: 8 weeks  PLANNED INTERVENTIONS: 97164- PT Re-evaluation, 97110-Therapeutic exercises, 97530- Therapeutic activity, 97112- Neuromuscular re-education, 97535- Self Care, 69629- Manual therapy, [phone]- Gait training, [phone]- Aquatic Therapy, [phone]- Electrical stimulation (unattended), Patient/Family education, Balance training, Stair training, Taping, Dry Needling, Joint mobilization, Spinal mobilization, Cryotherapy, and Moist heat.  PLAN FOR NEXT SESSION: Review/update HEP PRN. Work on [employer] exercises as appropriate with emphasis on comfortable mobility, core stability, and LE strengthening. Symptom modification strategies as indicated/appropriate.     Reola Mosher Kayla Weekes, PT 04/26/2023, 8:02 AM

## 2023-04-29 ENCOUNTER — Encounter (HOSPITAL_BASED_OUTPATIENT_CLINIC_OR_DEPARTMENT_OTHER): Payer: Self-pay | Admitting: Physical Therapy

## 2023-04-29 ENCOUNTER — Ambulatory Visit (HOSPITAL_BASED_OUTPATIENT_CLINIC_OR_DEPARTMENT_OTHER): Admitting: Physical Therapy

## 2023-04-29 DIAGNOSIS — M542 Cervicalgia: Secondary | ICD-10-CM | POA: Diagnosis not present

## 2023-04-29 DIAGNOSIS — M5459 Other low back pain: Secondary | ICD-10-CM

## 2023-04-29 NOTE — Therapy (Signed)
 OUTPATIENT PHYSICAL THERAPY TREATMENT   Patient Name: Ashley Murphy MRN: [id number] DOB:[birth year], 58 y.o., female Today's Date: 04/29/2023  END OF SESSION:  PT End of Session - 04/29/23 1436     Visit Number 3    Number of Visits 17    Date for PT Re-Evaluation 06/16/23    PT Start Time 1430    PT Stop Time 1512    PT Time Calculation (min) 42 min    Activity Tolerance Patient tolerated treatment well    Behavior During Therapy [facility] for tasks assessed/performed             Past Medical History:  Diagnosis Date   Anemia    Anxiety    Asthma    Family history of colon cancer    Family history of melanoma    Thyroid disease    hypothyroid   Urinary incontinence    not severe   Past Surgical History:  Procedure Laterality Date   ANKLE ARTHROSCOPY WITH RECONSTRUCTION Right    CESAREAN SECTION     Patient Active Problem List   Diagnosis Date Noted   Acute non-recurrent pansinusitis 07/22/2022   Encounter for annual physical exam 07/08/2022   Elevated LDL cholesterol level 07/08/2022   Family history of melanoma 09/22/2020   Family history of colon cancer 09/22/2020   Granuloma annulare 06/10/2017   Melanoma of skin (HCC) 06/10/2017   Ankle fracture, right 07/28/2015   Primary hypothyroidism 07/28/2015   Restless legs syndrome (RLS) 07/28/2015   Extrinsic asthma 07/28/2015    PCP: Bary Leriche, PA-C  REFERRING PROVIDER: Bedelia Person, MD  REFERRING DIAG: M54.2 (ICD-10-CM) - Cervicalgia  Rationale for Evaluation and Treatment: Rehabilitation  THERAPY DIAG:  Other low back pain  Cervicalgia  ONSET DATE: neck pain for a few years, back pain since August 2024  SUBJECTIVE:                                                                                                                                                                                           SUBJECTIVE STATEMENT: Patient reports she was sore the night after her last visit  but she felt better the next day.  She feels like overall she is making progress.  She felt like the extension stretching is helpful.   EVAL: Pt arrives w/ referral for cervicalgia although paper referral also mentions PT for low back.  Pt states neck has been on ongoing issue for several years, feels relatively stable although it does limit her. States she has had PT in the past which improved mobility some, has continued with some of her exercises.  Pt states her back is a newer issue and her more pressing concern. States it began last year, initially fluctuating. States towards the end of last year she was on a boat that had some heavy jolting while hitting wakes and seemed to consistently worsen afterwards. She is now having increased difficulty w/ majority of movement and prolonged positioning. She states most of her pain is around belt line R>L but will sometimes radiate superiorly or into hips. She notes that neck and back both tend to have better tolerance if she is in "just the right spot". States she feels guarded when she moves/walks, historically has been fairly active but has limited activity due to pain. Occasional pain/weakness in BIL LE, R>L. Occasional N/T as well. She denies any saddle anesthesia or sensory changes. Has known history of mild urinary incontinence but states this is stable and unchanging.    PERTINENT HISTORY:  anxiety, asthma, hx incontinence  PAIN:  Are you having pain: 2/10 neck, 4/10 back Location/description: neck (upper neck, R>L) pulling and tight feeling ; R>L belt line, occasionally goes up and has random pains down legs Best-worst over past week: neck 1-5/10 ; back 3-9/10 - aggravating factors: sitting ; looking up ; bending ; lower body dressing ; standing > a couple minutes ; walking inclines/declines - Easing factors: rest breaking, stretching    PRECAUTIONS: None   WEIGHT BEARING RESTRICTIONS: No  FALLS:  Has patient fallen in last 6 months?  No  LIVING ENVIRONMENT: 2 story home, main level livable ; some difficulty with stair navigation (d/t ankle issues and back pain) has rails at home Lives w/ husband and son Housework split - pt states she usually does typical cleaning, cooking  OCCUPATION: works as Emergency planning/management officer, mostly in office, lots of sitting   PLOF: Independent  PATIENT GOALS: feel better, particularly with her back   NEXT MD VISIT: mid April   OBJECTIVE:  Note: Objective measures were completed at Evaluation unless otherwise noted.  DIAGNOSTIC FINDINGS:  03/08/23 Lumbar XR "IMPRESSION: 1. Degenerative disc disease at L3-L4. 2. Trace retrolisthesis of L2 on L3 and L3 on L4."  PATIENT SURVEYS:  NDI: 28/50 ; 56%   COGNITION: Overall cognitive status: Within functional limits for tasks assessed     SENSATION/NEURO: Light touch intact BIL LE No clonus either LE Negative hoffmann and tromner sign BIL No ataxia with gait   POSTURE: increased lumbar lordosis, fwd head posture, R UT more elevated than L and mild lateral shift towards L   CERVICAL ROM:    A/PROM  eval  Flexion 100%  Extension 75% s  Right lateral flexion 50% R sided pain  Left lateral flexion 50% R sided pain  Right rotation 43 deg  Left rotation 55 deg   (Blank rows = not tested) (Key: WFL = within functional limits not formally assessed, * = concordant pain, s = stiffness/stretching sensation, NT = not tested)  Comments:    LUMBAR ROM:   AROM eval  Flexion Able to touch feet with relieving R sided stretch   Extension 50% *   Right lateral flexion Just above knee, painless   Left lateral flexion Mid thigh R sided pain   Right rotation 100% soreness   Left rotation 100% soreness    (Blank rows = not tested) (Key: WFL = within functional limits not formally assessed, * = concordant pain, s = stiffness/stretching sensation, NT = not tested) Comments: flexion is relieving in clinic today although pt notes oftentimes  returning  to neutral from flexion is painful  RANGE OF MOTION:      Right eval Left eval  Shoulder flexion    Functional ER combo    Functional IR combo    Knee extension    Ankle dorsiflexion     (Blank rows = not tested) (Key: WFL = within functional limits not formally assessed, * = concordant pain, s = stiffness/stretching sensation, NT = not tested)  Comments: full shoulder flex/abd ROM without pain   STRENGTH TESTING:  MMT Right eval Left eval  Shoulder flexion 5 5  Shoulder abduction 5 5  Shoulder extension 5 5  Elbow flexion    Elbow extension    Grip strength (gross)    Hip flexion 4 4 *  Hip abduction (modified sitting) 4 4  Knee flexion 4 4  Knee extension 4+ 4+  Ankle dorsiflexion 4+ 4+  Ankle plantarflexion     (Blank rows = not tested) (Key: WFL = within functional limits not formally assessed, * = concordant pain, s = stiffness/stretching sensation, NT = not tested)  Comments:     FUNCTIONAL TESTS:  5xSTS: 20.08sec no UE support; rigid trunk mechanics with reduced fwd weightshifting  GAIT: Distance walked: within clinic Assistive device utilized: None Level of assistance: Complete Independence Comments: reduced truncal rotation and arm swing, mildly reduced cadence  TREATMENT DATE:   Trigger Point Dry Needling  Initial Treatment: Pt instructed on Dry Needling rational, procedures, and possible side effects. Pt instructed to expect mild to moderate muscle soreness later in the day and/or into the next day.  Pt instructed in methods to reduce muscle soreness. Pt instructed to continue prescribed HEP. Because Dry Needling was performed over or adjacent to a lung field, pt was educated on S/S of pneumothorax and to seek immediate medical attention should they occur.  Patient was educated on signs and symptoms of infection and other risk factors and advised to seek medical attention should they occur.  Patient verbalized understanding of these  instructions and education.   Patient Verbal Consent Given: Yes Education Handout Provided: Yes Muscles Treated: bilateral gluteals 2x each bilateral L5 parspinals 1x each using a .30x50 needle  Electrical Stimulation Performed: No Treatment Response/Outcome: great twitch    Manual:  Skilled palpation of trigger points  Trigger point release to bilateral gluteals and lower lumbar spine Review =ed self trigger point release using tennis ball   Neuro-re-ed:   Row red 2x12  Shoulder extension 2x12   All performed with education on TA breathing. Reviewed the role of TA in spinal stabilization.   04/26/23 POE  1 minute Press up 2 x 10 Standing lumbar extension 2 x 10 Manual: STM lower lumbar paraspinals, grade II CPA L4-5 PPT 10 x 5 second holds PPT with march 2 x 10 LTR 10 x 5-10 second holds Bridge 2 x 10 Sidelying hip abduction 2 x 10 Prone hip extension 1 x 10   [facility] Adult PT Treatment:                                                DATE: 04/21/23 Therapeutic Exercise: Pelvic tilts, marches, and cervical rotation in supine practice repetitions ; HEP handout + education ; time spent w/ education on relevant anatomy/physiology and rationale for interventions as they pertain to impairments found on exam  PATIENT EDUCATION:  Education details: Pt education on PT impairments, prognosis, and POC. Informed consent. Rationale for interventions, safe/appropriate HEP performance 04/26/23 HEP, lumbar roll Person educated: Patient Education method: Explanation, Demonstration, Tactile cues, Verbal cues Education comprehension: verbalized understanding, returned demonstration, verbal cues required, tactile cues required, and needs further education    HOME EXERCISE PROGRAM: Access Code: [email or web address] URL: [email or web address] Date: 04/21/2023 - Supine Posterior Pelvic Tilt  - 2-3 x daily  - 1 sets - 8 reps - Supine March  - 2-3 x daily - 1 sets - 8 reps - Supine Cervical Rotation AROM on Pillow  - 2-3 x daily - 1 sets - 8 reps  04/26/23 - Standing Lumbar Extension  - 3 x daily - 7 x weekly - 1-2 sets - 10 reps - Seated Correct Posture  - 1 x daily - 7 x weekly - Supine Lower Trunk Rotation  - 1 x daily - 7 x weekly - 10 reps - 5-10 second hold - Supine Bridge  - 1 x daily - 7 x weekly - 2 sets - 10 reps  ASSESSMENT:  CLINICAL IMPRESSION: Therapy performed trial of trigger point dry needling to patient's lumbar paraspinals and bilateral upper gluteals.  She has significant spasming and trigger points in these areas today.  She had a great twitch response to each needle.  We also reviewed self soft tissue mobilization using a tennis ball to reduce post needle soreness.  She is given upper body series of exercises to work on to go along with her supine series of exercises.  She is advised to pick 1 series when she is doing her exercises.  She is also advised to focusing on the stretches that make her feel better including her soft tissue mobilization.  She is given a gluteal stretch that she can perform at work she gets sore.  Patient will benefit from continued skilled therapy to reduce muscle spasms and return to active lifestyle.   Eval: Patient is a pleasant 58 y.o. woman who was seen today for physical therapy evaluation and treatment for neck/back pain. The former is chronic in nature and pt states is limiting, but has been stable for past few years. She states back pain is newer and more limiting, tends to affect majority of daily tasks/mobility and has culminated in reduced activity levels. On exam she demonstrates concordant limitations in cervical and lumbar mobility, reduced LE strength, and altered transfer mechanics. 5xSTS is indicative of fall risk and reduced functional mobility. Red flag screening reassuring today. Pt tolerates exam/HEP well overall without adverse event  or increase in pain. Recommend trial of skilled PT to address aforementioned deficits with aim of improving functional tolerance and reducing pain with typical activities. Pt departs today's session in no acute distress, all voiced concerns/questions addressed appropriately from PT perspective.    OBJECTIVE IMPAIRMENTS: decreased activity tolerance, decreased endurance, decreased mobility, difficulty walking, decreased ROM, decreased strength, impaired perceived functional ability, improper body mechanics, postural dysfunction, and pain.   ACTIVITY LIMITATIONS: carrying, lifting, bending, sitting, standing, squatting, sleeping, transfers, dressing, and locomotion level  PARTICIPATION LIMITATIONS: meal prep, cleaning, laundry, driving, community activity, and occupation  PERSONAL FACTORS: Time since onset of injury/illness/exacerbation and 1-2 comorbidities: anxiety, asthma  are also affecting patient's functional outcome.   REHAB POTENTIAL: Good  CLINICAL DECISION MAKING: Evolving/moderate complexity  EVALUATION COMPLEXITY: Moderate   GOALS:  SHORT TERM GOALS: Target date: 05/19/2023  Pt will demonstrate appropriate understanding and performance of initially prescribed HEP in order to  facilitate improved independence with management of symptoms.  Baseline: HEP established  Goal status: INITIAL   2. Pt will report at least 25% improvement in overall pain levels over past week in order to facilitate improved tolerance to typical daily activities.   Baseline: neck 1-5/10 ; back 3-9/10   Goal status: INITIAL    LONG TERM GOALS: Target date: 06/16/2023   Pt will score less than or equal to 15/50 on NDI in order to demonstrate improved perception of function due to symptoms (MDC 10-13 pts per Loma Sender al 2009, 2010). Baseline: 28/50 Goal status: INITIAL   2.  Pt will demonstrate full/painless lumbar sidebending/extension AROM in order to demonstrate improved tolerance to functional movement  patterns.   Baseline: see ROM chart above Goal status: INITIAL  3.  Pt will demonstrate hip flex/abd MMT of at least 4+/5 bilaterally in order to demonstrate improved strength for functional movements.  Baseline: see MMT chart above Goal status: INITIAL  4. Pt will perform 5xSTS in <12 sec in order to demonstrate reduced fall risk and improved functional independence. (MCID of 2.3sec)  Baseline: 20sec  Goal status: INITIAL   5. Pt will report at least 50% decrease in overall pain levels in past week in order to facilitate improved tolerance to basic ADLs/mobility.   Baseline: neck 1-5/10 ; back 3-9/10   Goal status: INITIAL    6. Pt will endorse ability to perform lower body dressing with less than 3 pt increase in pain in order to facilitate improved tolerance to ADLs.  Baseline: inc pain w/ LBD  Goal status: INITIAL  7. Pt will endorse ability to stand/walk for >32min with no more than 1 rest break in order to facilitate improved tolerance to household tasks.  Baseline: inc pain w/ standing > a couple minutes  Goal status: INITIAL   PLAN:  PT FREQUENCY: 1-2x/week  PT DURATION: 8 weeks  PLANNED INTERVENTIONS: [id number]- PT Re-evaluation, 97110-Therapeutic exercises, 97530- Therapeutic activity, 97112- Neuromuscular re-education, 97535- Self Care, [id number]- Manual therapy, [phone]- Gait training, [phone]- Aquatic Therapy, [phone]- Electrical stimulation (unattended), Patient/Family education, Balance training, Stair training, Taping, Dry Needling, Joint mobilization, Spinal mobilization, Cryotherapy, and Moist heat.  PLAN FOR NEXT SESSION: Review/update HEP PRN. Work on [employer] exercises as appropriate with emphasis on comfortable mobility, core stability, and LE strengthening. Symptom modification strategies as indicated/appropriate.    Dessie Coma, PT 04/29/2023, 2:37 PM

## 2023-04-29 NOTE — Patient Instructions (Signed)

## 2023-05-03 ENCOUNTER — Ambulatory Visit (HOSPITAL_BASED_OUTPATIENT_CLINIC_OR_DEPARTMENT_OTHER): Payer: Self-pay | Admitting: Physical Therapy

## 2023-05-03 ENCOUNTER — Encounter (HOSPITAL_BASED_OUTPATIENT_CLINIC_OR_DEPARTMENT_OTHER): Payer: Self-pay | Admitting: Physical Therapy

## 2023-05-03 DIAGNOSIS — M542 Cervicalgia: Secondary | ICD-10-CM | POA: Diagnosis not present

## 2023-05-03 DIAGNOSIS — M5459 Other low back pain: Secondary | ICD-10-CM | POA: Diagnosis not present

## 2023-05-03 NOTE — Therapy (Signed)
 OUTPATIENT PHYSICAL THERAPY TREATMENT   Patient Name: Ashley Murphy MRN: [id number] DOB:21-Oct-1965, 58 y.o., female Today's Date: 05/03/2023  END OF SESSION:  PT End of Session - 05/03/23 0847     Visit Number 4    Number of Visits 17    Date for PT Re-Evaluation 06/16/23    Authorization Type [employer]    PT Start Time 0847    PT Stop Time 0928    PT Time Calculation (min) 41 min    Activity Tolerance Patient tolerated treatment well    Behavior During Therapy [facility] for tasks assessed/performed             Past Medical History:  Diagnosis Date   Anemia    Anxiety    Asthma    Family history of colon cancer    Family history of melanoma    Thyroid disease    hypothyroid   Urinary incontinence    not severe   Past Surgical History:  Procedure Laterality Date   ANKLE ARTHROSCOPY WITH RECONSTRUCTION Right    CESAREAN SECTION     Patient Active Problem List   Diagnosis Date Noted   Acute non-recurrent pansinusitis 07/22/2022   Encounter for annual physical exam 07/08/2022   Elevated LDL cholesterol level 07/08/2022   Family history of melanoma 09/22/2020   Family history of colon cancer 09/22/2020   Granuloma annulare 06/10/2017   Melanoma of skin (HCC) 06/10/2017   Ankle fracture, right 07/28/2015   Primary hypothyroidism 07/28/2015   Restless legs syndrome (RLS) 07/28/2015   Extrinsic asthma 07/28/2015    PCP: Bary Leriche, PA-C  REFERRING PROVIDER: Bedelia Person, MD  REFERRING DIAG: M54.2 (ICD-10-CM) - Cervicalgia  Rationale for Evaluation and Treatment: Rehabilitation  THERAPY DIAG:  Other low back pain  Cervicalgia  ONSET DATE: neck pain for a few years, back pain since August 2024  SUBJECTIVE:                                                                                                                                                                                           SUBJECTIVE STATEMENT: Patient reports been a rough  couple days. Sore in low back across low back. Last night felt like it was up a little higher in the middle of the back when laying down. Felt manual was helpful. DN helped temporarily. Does stretches. Nothing seems to last overall. Driving irritates things and so does sitting. Has not tried lumbar roll yet. Sits on the edge of her seat to force better posture.   EVAL: Pt arrives w/ referral for cervicalgia although paper referral also mentions PT for  low back.  Pt states neck has been on ongoing issue for several years, feels relatively stable although it does limit her. States she has had PT in the past which improved mobility some, has continued with some of her exercises. Pt states her back is a newer issue and her more pressing concern. States it began last year, initially fluctuating. States towards the end of last year she was on a boat that had some heavy jolting while hitting wakes and seemed to consistently worsen afterwards. She is now having increased difficulty w/ majority of movement and prolonged positioning. She states most of her pain is around belt line R>L but will sometimes radiate superiorly or into hips. She notes that neck and back both tend to have better tolerance if she is in "just the right spot". States she feels guarded when she moves/walks, historically has been fairly active but has limited activity due to pain. Occasional pain/weakness in BIL LE, R>L. Occasional N/T as well. She denies any saddle anesthesia or sensory changes. Has known history of mild urinary incontinence but states this is stable and unchanging.    PERTINENT HISTORY:  anxiety, asthma, hx incontinence  PAIN:  Are you having pain: 2/10 neck, 4/10 back Location/description: neck (upper neck, R>L) pulling and tight feeling ; R>L belt line, occasionally goes up and has random pains down legs Best-worst over past week: neck 1-5/10 ; back 3-9/10 - aggravating factors: sitting ; looking up ; bending ; lower  body dressing ; standing > a couple minutes ; walking inclines/declines - Easing factors: rest breaking, stretching    PRECAUTIONS: None   WEIGHT BEARING RESTRICTIONS: No  FALLS:  Has patient fallen in last 6 months? No  LIVING ENVIRONMENT: 2 story home, main level livable ; some difficulty with stair navigation (d/t ankle issues and back pain) has rails at home Lives w/ husband and son Housework split - pt states she usually does typical cleaning, cooking  OCCUPATION: works as Emergency planning/management officer, mostly in office, lots of sitting   PLOF: Independent  PATIENT GOALS: feel better, particularly with her back   NEXT MD VISIT: mid April   OBJECTIVE:  Note: Objective measures were completed at Evaluation unless otherwise noted.  DIAGNOSTIC FINDINGS:  03/08/23 Lumbar XR "IMPRESSION: 1. Degenerative disc disease at L3-L4. 2. Trace retrolisthesis of L2 on L3 and L3 on L4."  PATIENT SURVEYS:  NDI: 28/50 ; 56%   COGNITION: Overall cognitive status: Within functional limits for tasks assessed     SENSATION/NEURO: Light touch intact BIL LE No clonus either LE Negative hoffmann and tromner sign BIL No ataxia with gait   POSTURE: increased lumbar lordosis, fwd head posture, R UT more elevated than L and mild lateral shift towards L   CERVICAL ROM:    A/PROM  eval  Flexion 100%  Extension 75% s  Right lateral flexion 50% R sided pain  Left lateral flexion 50% R sided pain  Right rotation 43 deg  Left rotation 55 deg   (Blank rows = not tested) (Key: WFL = within functional limits not formally assessed, * = concordant pain, s = stiffness/stretching sensation, NT = not tested)  Comments:    LUMBAR ROM:   AROM eval  Flexion Able to touch feet with relieving R sided stretch   Extension 50% *   Right lateral flexion Just above knee, painless   Left lateral flexion Mid thigh R sided pain   Right rotation 100% soreness   Left rotation 100% soreness    (  Blank rows =  not tested) (Key: WFL = within functional limits not formally assessed, * = concordant pain, s = stiffness/stretching sensation, NT = not tested) Comments: flexion is relieving in clinic today although pt notes oftentimes returning to neutral from flexion is painful  RANGE OF MOTION:      Right eval Left eval  Shoulder flexion    Functional ER combo    Functional IR combo    Knee extension    Ankle dorsiflexion     (Blank rows = not tested) (Key: WFL = within functional limits not formally assessed, * = concordant pain, s = stiffness/stretching sensation, NT = not tested)  Comments: full shoulder flex/abd ROM without pain   STRENGTH TESTING:  MMT Right eval Left eval  Shoulder flexion 5 5  Shoulder abduction 5 5  Shoulder extension 5 5  Elbow flexion    Elbow extension    Grip strength (gross)    Hip flexion 4 4 *  Hip abduction (modified sitting) 4 4  Knee flexion 4 4  Knee extension 4+ 4+  Ankle dorsiflexion 4+ 4+  Ankle plantarflexion     (Blank rows = not tested) (Key: WFL = within functional limits not formally assessed, * = concordant pain, s = stiffness/stretching sensation, NT = not tested)  Comments:     FUNCTIONAL TESTS:  5xSTS: 20.08sec no UE support; rigid trunk mechanics with reduced fwd weightshifting  GAIT: Distance walked: within clinic Assistive device utilized: None Level of assistance: Complete Independence Comments: reduced truncal rotation and arm swing, mildly reduced cadence  TREATMENT DATE:  05/03/23 Manual: STM lower lumbar paraspinals, grade II CPA L4-5, grade II R UPA L4-5 Sidelying hip abduction 2 x 10 Prone hip extension 2 x 10 DKTC with heels on green ball 10 x 5-10 second holds Supine ab iso with ball 10 x 5 second holds Standing Row RTB 2 x 12 Standing shoulder extension RTB 2 x 12 Palof press 2 x 12 STS with RTB at knees  04/29/23 Trigger Point Dry Needling  Initial Treatment: Pt instructed on Dry Needling rational,  procedures, and possible side effects. Pt instructed to expect mild to moderate muscle soreness later in the day and/or into the next day.  Pt instructed in methods to reduce muscle soreness. Pt instructed to continue prescribed HEP. Because Dry Needling was performed over or adjacent to a lung field, pt was educated on S/S of pneumothorax and to seek immediate medical attention should they occur.  Patient was educated on signs and symptoms of infection and other risk factors and advised to seek medical attention should they occur.  Patient verbalized understanding of these instructions and education.   Patient Verbal Consent Given: Yes Education Handout Provided: Yes Muscles Treated: bilateral gluteals 2x each bilateral L5 parspinals 1x each using a .30x50 needle  Electrical Stimulation Performed: No Treatment Response/Outcome: great twitch    Manual:  Skilled palpation of trigger points  Trigger point release to bilateral gluteals and lower lumbar spine Review =ed self trigger point release using tennis ball   Neuro-re-ed:   Row red 2x12  Shoulder extension 2x12   All performed with education on TA breathing. Reviewed the role of TA in spinal stabilization.   04/26/23 POE  1 minute Press up 2 x 10 Standing lumbar extension 2 x 10 Manual: STM lower lumbar paraspinals, grade II CPA L4-5 PPT 10 x 5 second holds PPT with march 2 x 10 LTR 10 x 5-10 second holds Bridge 2 x  10 Sidelying hip abduction 2 x 10 Prone hip extension 1 x 10   [facility] Adult PT Treatment:                                                DATE: 04/21/23 Therapeutic Exercise: Pelvic tilts, marches, and cervical rotation in supine practice repetitions ; HEP handout + education ; time spent w/ education on relevant anatomy/physiology and rationale for interventions as they pertain to impairments found on exam                                                                                          PATIENT  EDUCATION:  Education details: Pt education on PT impairments, prognosis, and POC. Informed consent. Rationale for interventions, safe/appropriate HEP performance 04/26/23 HEP, lumbar roll Person educated: Patient Education method: Explanation, Demonstration, Tactile cues, Verbal cues Education comprehension: verbalized understanding, returned demonstration, verbal cues required, tactile cues required, and needs further education    HOME EXERCISE PROGRAM: Access Code: [email or web address] URL: [email or web address] Date: 04/21/2023 - Supine Posterior Pelvic Tilt  - 2-3 x daily - 1 sets - 8 reps - Supine March  - 2-3 x daily - 1 sets - 8 reps - Supine Cervical Rotation AROM on Pillow  - 2-3 x daily - 1 sets - 8 reps  04/26/23 - Standing Lumbar Extension  - 3 x daily - 7 x weekly - 1-2 sets - 10 reps - Seated Correct Posture  - 1 x daily - 7 x weekly - Supine Lower Trunk Rotation  - 1 x daily - 7 x weekly - 10 reps - 5-10 second hold - Supine Bridge  - 1 x daily - 7 x weekly - 2 sets - 10 reps  ASSESSMENT:  CLINICAL IMPRESSION: Patient with hyperactive and tender paraspinals and hypomobile lower lumber region, both improve with manual but some paraspinal tightness remains. Continued with glute and core strengthening which is tolerated well. Cueing for TRA activation throughout session with good carry over. Valgus with STS, improves minimally with cueing. Patient will continue to benefit from physical therapy in order to improve function and reduce impairment.    Eval: Patient is a pleasant 58 y.o. woman who was seen today for physical therapy evaluation and treatment for neck/back pain. The former is chronic in nature and pt states is limiting, but has been stable for past few years. She states back pain is newer and more limiting, tends to affect majority of daily tasks/mobility and has culminated in reduced activity levels. On exam she demonstrates concordant limitations in cervical and  lumbar mobility, reduced LE strength, and altered transfer mechanics. 5xSTS is indicative of fall risk and reduced functional mobility. Red flag screening reassuring today. Pt tolerates exam/HEP well overall without adverse event or increase in pain. Recommend trial of skilled PT to address aforementioned deficits with aim of improving functional tolerance and reducing pain with typical activities. Pt departs today's session in no acute distress, all voiced concerns/questions addressed appropriately from PT perspective.  OBJECTIVE IMPAIRMENTS: decreased activity tolerance, decreased endurance, decreased mobility, difficulty walking, decreased ROM, decreased strength, impaired perceived functional ability, improper body mechanics, postural dysfunction, and pain.   ACTIVITY LIMITATIONS: carrying, lifting, bending, sitting, standing, squatting, sleeping, transfers, dressing, and locomotion level  PARTICIPATION LIMITATIONS: meal prep, cleaning, laundry, driving, community activity, and occupation  PERSONAL FACTORS: Time since onset of injury/illness/exacerbation and 1-2 comorbidities: anxiety, asthma  are also affecting patient's functional outcome.   REHAB POTENTIAL: Good  CLINICAL DECISION MAKING: Evolving/moderate complexity  EVALUATION COMPLEXITY: Moderate   GOALS:  SHORT TERM GOALS: Target date: 05/19/2023  Pt will demonstrate appropriate understanding and performance of initially prescribed HEP in order to facilitate improved independence with management of symptoms.  Baseline: HEP established  Goal status: INITIAL   2. Pt will report at least 25% improvement in overall pain levels over past week in order to facilitate improved tolerance to typical daily activities.   Baseline: neck 1-5/10 ; back 3-9/10   Goal status: INITIAL    LONG TERM GOALS: Target date: 06/16/2023   Pt will score less than or equal to 15/50 on NDI in order to demonstrate improved perception of function due to  symptoms (MDC 10-13 pts per Loma Sender al 2009, 2010). Baseline: 28/50 Goal status: INITIAL   2.  Pt will demonstrate full/painless lumbar sidebending/extension AROM in order to demonstrate improved tolerance to functional movement patterns.   Baseline: see ROM chart above Goal status: INITIAL  3.  Pt will demonstrate hip flex/abd MMT of at least 4+/5 bilaterally in order to demonstrate improved strength for functional movements.  Baseline: see MMT chart above Goal status: INITIAL  4. Pt will perform 5xSTS in <12 sec in order to demonstrate reduced fall risk and improved functional independence. (MCID of 2.3sec)  Baseline: 20sec  Goal status: INITIAL   5. Pt will report at least 50% decrease in overall pain levels in past week in order to facilitate improved tolerance to basic ADLs/mobility.   Baseline: neck 1-5/10 ; back 3-9/10   Goal status: INITIAL    6. Pt will endorse ability to perform lower body dressing with less than 3 pt increase in pain in order to facilitate improved tolerance to ADLs.  Baseline: inc pain w/ LBD  Goal status: INITIAL  7. Pt will endorse ability to stand/walk for >29min with no more than 1 rest break in order to facilitate improved tolerance to household tasks.  Baseline: inc pain w/ standing > a couple minutes  Goal status: INITIAL   PLAN:  PT FREQUENCY: 1-2x/week  PT DURATION: 8 weeks  PLANNED INTERVENTIONS: [id number]- PT Re-evaluation, 97110-Therapeutic exercises, 97530- Therapeutic activity, 97112- Neuromuscular re-education, 97535- Self Care, [id number]- Manual therapy, [phone]- Gait training, [phone]- Aquatic Therapy, [phone]- Electrical stimulation (unattended), Patient/Family education, Balance training, Stair training, Taping, Dry Needling, Joint mobilization, Spinal mobilization, Cryotherapy, and Moist heat.  PLAN FOR NEXT SESSION: Review/update HEP PRN. Work on [employer] exercises as appropriate with emphasis on comfortable mobility, core stability, and  LE strengthening. Symptom modification strategies as indicated/appropriate.    Reola Mosher Laytoya Ion, PT 05/03/2023, 9:30 AM

## 2023-05-09 NOTE — Therapy (Signed)
 OUTPATIENT PHYSICAL THERAPY TREATMENT   Patient Name: Ashley Murphy MRN: [id number] DOB:10-15-1965, 58 y.o., female Today's Date: 05/10/2023  END OF SESSION:  PT End of Session - 05/10/23 1148     Visit Number 5    Number of Visits 17    Date for PT Re-Evaluation 06/16/23    Authorization Type [employer]    PT Start Time 1148    PT Stop Time 1228    PT Time Calculation (min) 40 min    Activity Tolerance Patient tolerated treatment well              Past Medical History:  Diagnosis Date   Anemia    Anxiety    Asthma    Family history of colon cancer    Family history of melanoma    Thyroid disease    hypothyroid   Urinary incontinence    not severe   Past Surgical History:  Procedure Laterality Date   ANKLE ARTHROSCOPY WITH RECONSTRUCTION Right    CESAREAN SECTION     Patient Active Problem List   Diagnosis Date Noted   Acute non-recurrent pansinusitis 07/22/2022   Encounter for annual physical exam 07/08/2022   Elevated LDL cholesterol level 07/08/2022   Family history of melanoma 09/22/2020   Family history of colon cancer 09/22/2020   Granuloma annulare 06/10/2017   Melanoma of skin (HCC) 06/10/2017   Ankle fracture, right 07/28/2015   Primary hypothyroidism 07/28/2015   Restless legs syndrome (RLS) 07/28/2015   Extrinsic asthma 07/28/2015    PCP: Bary Leriche, PA-C  REFERRING PROVIDER: Bedelia Person, MD  REFERRING DIAG: M54.2 (ICD-10-CM) - Cervicalgia  Rationale for Evaluation and Treatment: Rehabilitation  THERAPY DIAG:  Other low back pain  Cervicalgia  ONSET DATE: neck pain for a few years, back pain since August 2024  SUBJECTIVE:                                                                                                                                                                                           SUBJECTIVE STATEMENT: 05/10/2023 not feeling too bad today but has had a rougher week. Unsure of provocative  factor. Still having trouble with sitting, standing, and bending. States she will get some soreness after PT. Has been doing HEP without issue   EVAL: Pt arrives w/ referral for cervicalgia although paper referral also mentions PT for low back.  Pt states neck has been on ongoing issue for several years, feels relatively stable although it does limit her. States she has had PT in the past which improved mobility some, has continued with some of her exercises. Pt  states her back is a newer issue and her more pressing concern. States it began last year, initially fluctuating. States towards the end of last year she was on a boat that had some heavy jolting while hitting wakes and seemed to consistently worsen afterwards. She is now having increased difficulty w/ majority of movement and prolonged positioning. She states most of her pain is around belt line R>L but will sometimes radiate superiorly or into hips. She notes that neck and back both tend to have better tolerance if she is in "just the right spot". States she feels guarded when she moves/walks, historically has been fairly active but has limited activity due to pain. Occasional pain/weakness in BIL LE, R>L. Occasional N/T as well. She denies any saddle anesthesia or sensory changes. Has known history of mild urinary incontinence but states this is stable and unchanging.    PERTINENT HISTORY:  anxiety, asthma, hx incontinence  PAIN:  Are you having pain: 4/10 back, 2/10neck   Per eval - Location/description: neck (upper neck, R>L) pulling and tight feeling ; R>L belt line, occasionally goes up and has random pains down legs Best-worst over past week: neck 1-5/10 ; back 3-9/10 - aggravating factors: sitting ; looking up ; bending ; lower body dressing ; standing > a couple minutes ; walking inclines/declines - Easing factors: rest breaking, stretching    PRECAUTIONS: None   WEIGHT BEARING RESTRICTIONS: No  FALLS:  Has patient fallen  in last 6 months? No  LIVING ENVIRONMENT: 2 story home, main level livable ; some difficulty with stair navigation (d/t ankle issues and back pain) has rails at home Lives w/ husband and son Housework split - pt states she usually does typical cleaning, cooking  OCCUPATION: works as Emergency planning/management officer, mostly in office, lots of sitting   PLOF: Independent  PATIENT GOALS: feel better, particularly with her back   NEXT MD VISIT: mid April   OBJECTIVE:  Note: Objective measures were completed at Evaluation unless otherwise noted.  DIAGNOSTIC FINDINGS:  03/08/23 Lumbar XR "IMPRESSION: 1. Degenerative disc disease at L3-L4. 2. Trace retrolisthesis of L2 on L3 and L3 on L4."  PATIENT SURVEYS:  NDI: 28/50 ; 56%   COGNITION: Overall cognitive status: Within functional limits for tasks assessed     SENSATION/NEURO: Light touch intact BIL LE No clonus either LE Negative hoffmann and tromner sign BIL No ataxia with gait   POSTURE: increased lumbar lordosis, fwd head posture, R UT more elevated than L and mild lateral shift towards L   CERVICAL ROM:    A/PROM  eval  Flexion 100%  Extension 75% s  Right lateral flexion 50% R sided pain  Left lateral flexion 50% R sided pain  Right rotation 43 deg  Left rotation 55 deg   (Blank rows = not tested) (Key: WFL = within functional limits not formally assessed, * = concordant pain, s = stiffness/stretching sensation, NT = not tested)  Comments:    LUMBAR ROM:   AROM eval  Flexion Able to touch feet with relieving R sided stretch   Extension 50% *   Right lateral flexion Just above knee, painless   Left lateral flexion Mid thigh R sided pain   Right rotation 100% soreness   Left rotation 100% soreness    (Blank rows = not tested) (Key: WFL = within functional limits not formally assessed, * = concordant pain, s = stiffness/stretching sensation, NT = not tested) Comments: flexion is relieving in clinic today although pt  notes oftentimes returning to neutral from flexion is painful  RANGE OF MOTION:      Right eval Left eval  Shoulder flexion    Functional ER combo    Functional IR combo    Knee extension    Ankle dorsiflexion     (Blank rows = not tested) (Key: WFL = within functional limits not formally assessed, * = concordant pain, s = stiffness/stretching sensation, NT = not tested)  Comments: full shoulder flex/abd ROM without pain   STRENGTH TESTING:  MMT Right eval Left eval  Shoulder flexion 5 5  Shoulder abduction 5 5  Shoulder extension 5 5  Elbow flexion    Elbow extension    Grip strength (gross)    Hip flexion 4 4 *  Hip abduction (modified sitting) 4 4  Knee flexion 4 4  Knee extension 4+ 4+  Ankle dorsiflexion 4+ 4+  Ankle plantarflexion     (Blank rows = not tested) (Key: WFL = within functional limits not formally assessed, * = concordant pain, s = stiffness/stretching sensation, NT = not tested)  Comments:     FUNCTIONAL TESTS:  5xSTS: 20.08sec no UE support; rigid trunk mechanics with reduced fwd weightshifting  GAIT: Distance walked: within clinic Assistive device utilized: None Level of assistance: Complete Independence Comments: reduced truncal rotation and arm swing, mildly reduced cadence  TREATMENT DATE:  [facility] Adult PT Treatment:                                                DATE: 05/10/23 Therapeutic Exercise: Short lever open books 2x5 BIL w/ breath control  SKTC hooklying 2x30sec BIL  DKTC 2x30sec   Neuromuscular re-ed: Bridge x12 Bridge + ball squeeze x10 Hooklying red band pull down 2x8 cues for core contraction  Manual Therapy: Prone; STM BIL lumbar paraspinals, QL, superior glute; R>L    05/03/23 Manual: STM lower lumbar paraspinals, grade II CPA L4-5, grade II R UPA L4-5 Sidelying hip abduction 2 x 10 Prone hip extension 2 x 10 DKTC with heels on green ball 10 x 5-10 second holds Supine ab iso with ball 10 x 5 second holds Standing  Row RTB 2 x 12 Standing shoulder extension RTB 2 x 12 Palof press 2 x 12 STS with RTB at knees  04/29/23 Trigger Point Dry Needling  Initial Treatment: Pt instructed on Dry Needling rational, procedures, and possible side effects. Pt instructed to expect mild to moderate muscle soreness later in the day and/or into the next day.  Pt instructed in methods to reduce muscle soreness. Pt instructed to continue prescribed HEP. Because Dry Needling was performed over or adjacent to a lung field, pt was educated on S/S of pneumothorax and to seek immediate medical attention should they occur.  Patient was educated on signs and symptoms of infection and other risk factors and advised to seek medical attention should they occur.  Patient verbalized understanding of these instructions and education.   Patient Verbal Consent Given: Yes Education Handout Provided: Yes Muscles Treated: bilateral gluteals 2x each bilateral L5 parspinals 1x each using a .30x50 needle  Electrical Stimulation Performed: No Treatment Response/Outcome: great twitch    Manual:  Skilled palpation of trigger points  Trigger point release to bilateral gluteals and lower lumbar spine Review =ed self trigger point release using tennis ball   Neuro-re-ed:  Row red 2x12  Shoulder extension 2x12   All performed with education on TA breathing. Reviewed the role of TA in spinal stabilization.   04/26/23 POE  1 minute Press up 2 x 10 Standing lumbar extension 2 x 10 Manual: STM lower lumbar paraspinals, grade II CPA L4-5 PPT 10 x 5 second holds PPT with march 2 x 10 LTR 10 x 5-10 second holds Bridge 2 x 10 Sidelying hip abduction 2 x 10 Prone hip extension 1 x 10   [facility] Adult PT Treatment:                                                DATE: 04/21/23 Therapeutic Exercise: Pelvic tilts, marches, and cervical rotation in supine practice repetitions ; HEP handout + education ; time spent w/ education on relevant  anatomy/physiology and rationale for interventions as they pertain to impairments found on exam                                                                                          PATIENT EDUCATION:  Education details: rationale for interventions, HEP  Person educated: Patient Education method: Explanation, Demonstration, Tactile cues, Verbal cues Education comprehension: verbalized understanding, returned demonstration, verbal cues required, tactile cues required, and needs further education     HOME EXERCISE PROGRAM: Access Code: [email or web address] URL: [email or web address] Date: 04/21/2023 - Supine Posterior Pelvic Tilt  - 2-3 x daily - 1 sets - 8 reps - Supine March  - 2-3 x daily - 1 sets - 8 reps - Supine Cervical Rotation AROM on Pillow  - 2-3 x daily - 1 sets - 8 reps  04/26/23 - Standing Lumbar Extension  - 3 x daily - 7 x weekly - 1-2 sets - 10 reps - Seated Correct Posture  - 1 x daily - 7 x weekly - Supine Lower Trunk Rotation  - 1 x daily - 7 x weekly - 10 reps - 5-10 second hold - Supine Bridge  - 1 x daily - 7 x weekly - 2 sets - 10 reps  ASSESSMENT:  CLINICAL IMPRESSION: 05/10/2023 Pt arrives w/ 2-4/10 neck and back pain respectively. Today we focus on trying to maximize comfort level with gentle mobility exercises emphasizing tissue extensibility and breath control. Continues to have pain when pushing to end range but tolerates well within cues for comfortable ROM and pacing. Manual at end of session to reduce soreness/fatigue. No adverse events, does not endorse any increase in pain on departure. Recommend continuing along current POC in order to address relevant deficits and improve functional tolerance. Pt departs today's session in no acute distress, all voiced questions/concerns addressed appropriately from PT perspective.     Eval: Patient is a pleasant 58 y.o. woman who was seen today for physical therapy evaluation and treatment for neck/back pain. The  former is chronic in nature and pt states is limiting, but has been stable for past few years. She states back pain is newer and more limiting, tends to affect  majority of daily tasks/mobility and has culminated in reduced activity levels. On exam she demonstrates concordant limitations in cervical and lumbar mobility, reduced LE strength, and altered transfer mechanics. 5xSTS is indicative of fall risk and reduced functional mobility. Red flag screening reassuring today. Pt tolerates exam/HEP well overall without adverse event or increase in pain. Recommend trial of skilled PT to address aforementioned deficits with aim of improving functional tolerance and reducing pain with typical activities. Pt departs today's session in no acute distress, all voiced concerns/questions addressed appropriately from PT perspective.    OBJECTIVE IMPAIRMENTS: decreased activity tolerance, decreased endurance, decreased mobility, difficulty walking, decreased ROM, decreased strength, impaired perceived functional ability, improper body mechanics, postural dysfunction, and pain.   ACTIVITY LIMITATIONS: carrying, lifting, bending, sitting, standing, squatting, sleeping, transfers, dressing, and locomotion level  PARTICIPATION LIMITATIONS: meal prep, cleaning, laundry, driving, community activity, and occupation  PERSONAL FACTORS: Time since onset of injury/illness/exacerbation and 1-2 comorbidities: anxiety, asthma  are also affecting patient's functional outcome.   REHAB POTENTIAL: Good  CLINICAL DECISION MAKING: Evolving/moderate complexity  EVALUATION COMPLEXITY: Moderate   GOALS:  SHORT TERM GOALS: Target date: 05/19/2023  Pt will demonstrate appropriate understanding and performance of initially prescribed HEP in order to facilitate improved independence with management of symptoms.  Baseline: HEP established  Goal status: INITIAL   2. Pt will report at least 25% improvement in overall pain levels over  past week in order to facilitate improved tolerance to typical daily activities.   Baseline: neck 1-5/10 ; back 3-9/10   Goal status: INITIAL    LONG TERM GOALS: Target date: 06/16/2023   Pt will score less than or equal to 15/50 on NDI in order to demonstrate improved perception of function due to symptoms (MDC 10-13 pts per Loma Sender al 2009, 2010). Baseline: 28/50 Goal status: INITIAL   2.  Pt will demonstrate full/painless lumbar sidebending/extension AROM in order to demonstrate improved tolerance to functional movement patterns.   Baseline: see ROM chart above Goal status: INITIAL  3.  Pt will demonstrate hip flex/abd MMT of at least 4+/5 bilaterally in order to demonstrate improved strength for functional movements.  Baseline: see MMT chart above Goal status: INITIAL  4. Pt will perform 5xSTS in <12 sec in order to demonstrate reduced fall risk and improved functional independence. (MCID of 2.3sec)  Baseline: 20sec  Goal status: INITIAL   5. Pt will report at least 50% decrease in overall pain levels in past week in order to facilitate improved tolerance to basic ADLs/mobility.   Baseline: neck 1-5/10 ; back 3-9/10   Goal status: INITIAL    6. Pt will endorse ability to perform lower body dressing with less than 3 pt increase in pain in order to facilitate improved tolerance to ADLs.  Baseline: inc pain w/ LBD  Goal status: INITIAL  7. Pt will endorse ability to stand/walk for >52min with no more than 1 rest break in order to facilitate improved tolerance to household tasks.  Baseline: inc pain w/ standing > a couple minutes  Goal status: INITIAL   PLAN:  PT FREQUENCY: 1-2x/week  PT DURATION: 8 weeks  PLANNED INTERVENTIONS: [id number]- PT Re-evaluation, 97110-Therapeutic exercises, 97530- Therapeutic activity, 97112- Neuromuscular re-education, 97535- Self Care, 13086- Manual therapy, [phone]- Gait training, [phone]- Aquatic Therapy, [phone]- Electrical stimulation (unattended),  Patient/Family education, Balance training, Stair training, Taping, Dry Needling, Joint mobilization, Spinal mobilization, Cryotherapy, and Moist heat.  PLAN FOR NEXT SESSION: Review/update HEP PRN. Work on [employer] as appropriate with emphasis  on comfortable mobility, core stability, and LE strengthening. Symptom modification strategies as indicated/appropriate.    Ashley Murrain PT, DPT 05/10/2023 12:31 PM

## 2023-05-10 ENCOUNTER — Ambulatory Visit (HOSPITAL_BASED_OUTPATIENT_CLINIC_OR_DEPARTMENT_OTHER): Attending: Neurosurgery | Admitting: Physical Therapy

## 2023-05-10 ENCOUNTER — Encounter (HOSPITAL_BASED_OUTPATIENT_CLINIC_OR_DEPARTMENT_OTHER): Payer: Self-pay | Admitting: Physical Therapy

## 2023-05-10 DIAGNOSIS — M542 Cervicalgia: Secondary | ICD-10-CM | POA: Diagnosis not present

## 2023-05-10 DIAGNOSIS — M5459 Other low back pain: Secondary | ICD-10-CM | POA: Insufficient documentation

## 2023-05-12 NOTE — Therapy (Signed)
 OUTPATIENT PHYSICAL THERAPY TREATMENT   Patient Name: Ashley Murphy MRN: [id number] DOB:1965/07/06, 58 y.o., female Today's Date: 05/13/2023  END OF SESSION:  PT End of Session - 05/13/23 1146     Visit Number 6    Number of Visits 17    Date for PT Re-Evaluation 06/16/23    Authorization Type BC[employer]    PT Start Time 1146    PT Stop Time 1224    PT Time Calculation (min) 38 min    Activity Tolerance Patient tolerated treatment well               Past Medical History:  Diagnosis Date   Anemia    Anxiety    Asthma    Family history of colon cancer    Family history of melanoma    Thyroid disease    hypothyroid   Urinary incontinence    not severe   Past Surgical History:  Procedure Laterality Date   ANKLE ARTHROSCOPY WITH RECONSTRUCTION Right    CESAREAN SECTION     Patient Active Problem List   Diagnosis Date Noted   Acute non-recurrent pansinusitis 07/22/2022   Encounter for annual physical exam 07/08/2022   Elevated LDL cholesterol level 07/08/2022   Family history of melanoma 09/22/2020   Family history of colon cancer 09/22/2020   Granuloma annulare 06/10/2017   Melanoma of skin (HCC) 06/10/2017   Ankle fracture, right 07/28/2015   Primary hypothyroidism 07/28/2015   Restless legs syndrome (RLS) 07/28/2015   Extrinsic asthma 07/28/2015    PCP: Bary Leriche, PA-C  REFERRING PROVIDER: Bedelia Person, MD  REFERRING DIAG: M54.2 (ICD-10-CM) - Cervicalgia  Rationale for Evaluation and Treatment: Rehabilitation  THERAPY DIAG:  Other low back pain  Cervicalgia  ONSET DATE: neck pain for a few years, back pain since August 2024  SUBJECTIVE:                                                                                                                                                                                           SUBJECTIVE STATEMENT: 05/13/2023 Pt states she felt about the same after last session. Feeling about the same  overall. HEP going well, feels like they're getting easier.     EVAL: Pt arrives w/ referral for cervicalgia although paper referral also mentions PT for low back.  Pt states neck has been on ongoing issue for several years, feels relatively stable although it does limit her. States she has had PT in the past which improved mobility some, has continued with some of her exercises. Pt states her back is a newer issue and her more pressing  concern. States it began last year, initially fluctuating. States towards the end of last year she was on a boat that had some heavy jolting while hitting wakes and seemed to consistently worsen afterwards. She is now having increased difficulty w/ majority of movement and prolonged positioning. She states most of her pain is around belt line R>L but will sometimes radiate superiorly or into hips. She notes that neck and back both tend to have better tolerance if she is in "just the right spot". States she feels guarded when she moves/walks, historically has been fairly active but has limited activity due to pain. Occasional pain/weakness in BIL LE, R>L. Occasional N/T as well. She denies any saddle anesthesia or sensory changes. Has known history of mild urinary incontinence but states this is stable and unchanging.    PERTINENT HISTORY:  anxiety, asthma, hx incontinence  PAIN:  Are you having pain: 2/10 back, neck 3/10   Per eval - Location/description: neck (upper neck, R>L) pulling and tight feeling ; R>L belt line, occasionally goes up and has random pains down legs Best-worst over past week: neck 1-5/10 ; back 3-9/10 - aggravating factors: sitting ; looking up ; bending ; lower body dressing ; standing > a couple minutes ; walking inclines/declines - Easing factors: rest breaking, stretching    PRECAUTIONS: None   WEIGHT BEARING RESTRICTIONS: No  FALLS:  Has patient fallen in last 6 months? No  LIVING ENVIRONMENT: 2 story home, main level livable  ; some difficulty with stair navigation (d/t ankle issues and back pain) has rails at home Lives w/ husband and son Housework split - pt states she usually does typical cleaning, cooking  OCCUPATION: works as Emergency planning/management officer, mostly in office, lots of sitting   PLOF: Independent  PATIENT GOALS: feel better, particularly with her back   NEXT MD VISIT: mid April   OBJECTIVE:  Note: Objective measures were completed at Evaluation unless otherwise noted.  DIAGNOSTIC FINDINGS:  03/08/23 Lumbar XR "IMPRESSION: 1. Degenerative disc disease at L3-L4. 2. Trace retrolisthesis of L2 on L3 and L3 on L4."  PATIENT SURVEYS:  NDI: 28/50 ; 56%   COGNITION: Overall cognitive status: Within functional limits for tasks assessed     SENSATION/NEURO: Light touch intact BIL LE No clonus either LE Negative hoffmann and tromner sign BIL No ataxia with gait   POSTURE: increased lumbar lordosis, fwd head posture, R UT more elevated than L and mild lateral shift towards L   CERVICAL ROM:    A/PROM  eval AROM 05/13/23  Flexion 100%   Extension 75% s   Right lateral flexion 50% R sided pain   Left lateral flexion 50% R sided pain   Right rotation 43 deg 58 deg  Left rotation 55 deg 53 deg   (Blank rows = not tested) (Key: WFL = within functional limits not formally assessed, * = concordant pain, s = stiffness/stretching sensation, NT = not tested)  Comments:    LUMBAR ROM:   AROM eval  Flexion Able to touch feet with relieving R sided stretch   Extension 50% *   Right lateral flexion Just above knee, painless   Left lateral flexion Mid thigh R sided pain   Right rotation 100% soreness   Left rotation 100% soreness    (Blank rows = not tested) (Key: WFL = within functional limits not formally assessed, * = concordant pain, s = stiffness/stretching sensation, NT = not tested) Comments: flexion is relieving in clinic today although pt notes  oftentimes returning to neutral from  flexion is painful  RANGE OF MOTION:      Right eval Left eval  Shoulder flexion    Functional ER combo    Functional IR combo    Knee extension    Ankle dorsiflexion     (Blank rows = not tested) (Key: WFL = within functional limits not formally assessed, * = concordant pain, s = stiffness/stretching sensation, NT = not tested)  Comments: full shoulder flex/abd ROM without pain   STRENGTH TESTING:  MMT Right eval Left eval  Shoulder flexion 5 5  Shoulder abduction 5 5  Shoulder extension 5 5  Elbow flexion    Elbow extension    Grip strength (gross)    Hip flexion 4 4 *  Hip abduction (modified sitting) 4 4  Knee flexion 4 4  Knee extension 4+ 4+  Ankle dorsiflexion 4+ 4+  Ankle plantarflexion     (Blank rows = not tested) (Key: WFL = within functional limits not formally assessed, * = concordant pain, s = stiffness/stretching sensation, NT = not tested)  Comments:     FUNCTIONAL TESTS:  5xSTS: 20.08sec no UE support; rigid trunk mechanics with reduced fwd weightshifting  GAIT: Distance walked: within clinic Assistive device utilized: None Level of assistance: Complete Independence Comments: reduced truncal rotation and arm swing, mildly reduced cadence  TREATMENT DATE:  [facility] Adult PT Treatment:                                                DATE: 05/13/23 Therapeutic Exercise: Supine cervical rotations x5 BIL cues for appropriate sensation and ROM Short lever open books x8 BIL  Standing cervical retraction w/ ball at wall 2x10 cues for reduced compensations  Manual Therapy: Supine; STM BIL paracervicals, suboccipitals. STM and trigger point release L LS with gentle passive pin and stretch Prone: STM BIL superior glutes and lumbar paraspinals  Neuromuscular re-ed: 4 inch runner step up x8 BIL cues for sequencing and posture 4 inch runner step up + OH press (unweighted) x8 BIL, 1# x8 BIL, cues for pacing, posture, and appropriate mechanics    [facility] Adult  PT Treatment:                                                DATE: 05/10/23 Therapeutic Exercise: Short lever open books 2x5 BIL w/ breath control  SKTC hooklying 2x30sec BIL  DKTC 2x30sec   Neuromuscular re-ed: Bridge x12 Bridge + ball squeeze x10 Hooklying red band pull down 2x8 cues for core contraction  Manual Therapy: Prone; STM BIL lumbar paraspinals, QL, superior glute; R>L    05/03/23 Manual: STM lower lumbar paraspinals, grade II CPA L4-5, grade II R UPA L4-5 Sidelying hip abduction 2 x 10 Prone hip extension 2 x 10 DKTC with heels on green ball 10 x 5-10 second holds Supine ab iso with ball 10 x 5 second holds Standing Row RTB 2 x 12 Standing shoulder extension RTB 2 x 12 Palof press 2 x 12 STS with RTB at knees  04/29/23 Trigger Point Dry Needling  Initial Treatment: Pt instructed on Dry Needling rational, procedures, and possible side effects. Pt instructed to expect mild to moderate muscle soreness later  in the day and/or into the next day.  Pt instructed in methods to reduce muscle soreness. Pt instructed to continue prescribed HEP. Because Dry Needling was performed over or adjacent to a lung field, pt was educated on S/S of pneumothorax and to seek immediate medical attention should they occur.  Patient was educated on signs and symptoms of infection and other risk factors and advised to seek medical attention should they occur.  Patient verbalized understanding of these instructions and education.   Patient Verbal Consent Given: Yes Education Handout Provided: Yes Muscles Treated: bilateral gluteals 2x each bilateral L5 parspinals 1x each using a .30x50 needle  Electrical Stimulation Performed: No Treatment Response/Outcome: great twitch    Manual:  Skilled palpation of trigger points  Trigger point release to bilateral gluteals and lower lumbar spine Review =ed self trigger point release using tennis ball   Neuro-re-ed:   Row red 2x12  Shoulder  extension 2x12   All performed with education on TA breathing. Reviewed the role of TA in spinal stabilization.   04/26/23 POE  1 minute Press up 2 x 10 Standing lumbar extension 2 x 10 Manual: STM lower lumbar paraspinals, grade II CPA L4-5 PPT 10 x 5 second holds PPT with march 2 x 10 LTR 10 x 5-10 second holds Bridge 2 x 10 Sidelying hip abduction 2 x 10 Prone hip extension 1 x 10   PATIENT EDUCATION:  Education details: rationale for interventions, HEP  Person educated: Patient Education method: Explanation, Demonstration, Tactile cues, Verbal cues Education comprehension: verbalized understanding, returned demonstration, verbal cues required, tactile cues required, and needs further education     HOME EXERCISE PROGRAM: Access Code: [email or web address] URL: [email or web address] Date: 04/21/2023 - Supine Posterior Pelvic Tilt  - 2-3 x daily - 1 sets - 8 reps - Supine March  - 2-3 x daily - 1 sets - 8 reps - Supine Cervical Rotation AROM on Pillow  - 2-3 x daily - 1 sets - 8 reps  04/26/23 - Standing Lumbar Extension  - 3 x daily - 7 x weekly - 1-2 sets - 10 reps - Seated Correct Posture  - 1 x daily - 7 x weekly - Supine Lower Trunk Rotation  - 1 x daily - 7 x weekly - 10 reps - 5-10 second hold - Supine Bridge  - 1 x daily - 7 x weekly - 2 sets - 10 reps  ASSESSMENT:  CLINICAL IMPRESSION: 05/13/2023 Pt arrives w/ report of feeling about the same overall although she notes HEP is getting easier and she demonstrates improved cervical mobility. Today we are able to progress for increased complexity with core/cervical stability training and increased volume for cervical mobility exercises. Reports good relief w/ manual as above at end of session to mitigate soreness/fatigue. Reports increased challenge this session but no increase in pain, mild muscular fatigue as expected, no adverse events. Recommend continuing along current POC in order to address relevant deficits and  improve functional tolerance. Pt departs today's session in no acute distress, all voiced questions/concerns addressed appropriately from PT perspective.     Eval: Patient is a pleasant 58 y.o. woman who was seen today for physical therapy evaluation and treatment for neck/back pain. The former is chronic in nature and pt states is limiting, but has been stable for past few years. She states back pain is newer and more limiting, tends to affect majority of daily tasks/mobility and has culminated in reduced activity levels. On exam she demonstrates concordant limitations in  cervical and lumbar mobility, reduced LE strength, and altered transfer mechanics. 5xSTS is indicative of fall risk and reduced functional mobility. Red flag screening reassuring today. Pt tolerates exam/HEP well overall without adverse event or increase in pain. Recommend trial of skilled PT to address aforementioned deficits with aim of improving functional tolerance and reducing pain with typical activities. Pt departs today's session in no acute distress, all voiced concerns/questions addressed appropriately from PT perspective.    OBJECTIVE IMPAIRMENTS: decreased activity tolerance, decreased endurance, decreased mobility, difficulty walking, decreased ROM, decreased strength, impaired perceived functional ability, improper body mechanics, postural dysfunction, and pain.   ACTIVITY LIMITATIONS: carrying, lifting, bending, sitting, standing, squatting, sleeping, transfers, dressing, and locomotion level  PARTICIPATION LIMITATIONS: meal prep, cleaning, laundry, driving, community activity, and occupation  PERSONAL FACTORS: Time since onset of injury/illness/exacerbation and 1-2 comorbidities: anxiety, asthma  are also affecting patient's functional outcome.   REHAB POTENTIAL: Good  CLINICAL DECISION MAKING: Evolving/moderate complexity  EVALUATION COMPLEXITY: Moderate   GOALS:  SHORT TERM GOALS: Target date:  05/19/2023  Pt will demonstrate appropriate understanding and performance of initially prescribed HEP in order to facilitate improved independence with management of symptoms.  Baseline: HEP established  Goal status: INITIAL   2. Pt will report at least 25% improvement in overall pain levels over past week in order to facilitate improved tolerance to typical daily activities.   Baseline: neck 1-5/10 ; back 3-9/10   Goal status: INITIAL    LONG TERM GOALS: Target date: 06/16/2023   Pt will score less than or equal to 15/50 on NDI in order to demonstrate improved perception of function due to symptoms (MDC 10-13 pts per Loma Sender al 2009, 2010). Baseline: 28/50 Goal status: INITIAL   2.  Pt will demonstrate full/painless lumbar sidebending/extension AROM in order to demonstrate improved tolerance to functional movement patterns.   Baseline: see ROM chart above Goal status: INITIAL  3.  Pt will demonstrate hip flex/abd MMT of at least 4+/5 bilaterally in order to demonstrate improved strength for functional movements.  Baseline: see MMT chart above Goal status: INITIAL  4. Pt will perform 5xSTS in <12 sec in order to demonstrate reduced fall risk and improved functional independence. (MCID of 2.3sec)  Baseline: 20sec  Goal status: INITIAL   5. Pt will report at least 50% decrease in overall pain levels in past week in order to facilitate improved tolerance to basic ADLs/mobility.   Baseline: neck 1-5/10 ; back 3-9/10   Goal status: INITIAL    6. Pt will endorse ability to perform lower body dressing with less than 3 pt increase in pain in order to facilitate improved tolerance to ADLs.  Baseline: inc pain w/ LBD  Goal status: INITIAL  7. Pt will endorse ability to stand/walk for >54min with no more than 1 rest break in order to facilitate improved tolerance to household tasks.  Baseline: inc pain w/ standing > a couple minutes  Goal status: INITIAL   PLAN:  PT FREQUENCY:  1-2x/week  PT DURATION: 8 weeks  PLANNED INTERVENTIONS: 97164- PT Re-evaluation, 97110-Therapeutic exercises, 97530- Therapeutic activity, 97112- Neuromuscular re-education, 97535- Self Care, 60109- Manual therapy, [phone]- Gait training, [phone]- Aquatic Therapy, [phone]- Electrical stimulation (unattended), Patient/Family education, Balance training, Stair training, Taping, Dry Needling, Joint mobilization, Spinal mobilization, Cryotherapy, and Moist heat.  PLAN FOR NEXT SESSION: Review/update HEP PRN. Work on [employer] exercises as appropriate with emphasis on comfortable mobility, core stability, and LE strengthening. Symptom modification strategies as indicated/appropriate.    Cliffton Asters  Yecheskel Kurek PT, DPT 05/13/2023 12:28 PM

## 2023-05-13 ENCOUNTER — Encounter (HOSPITAL_BASED_OUTPATIENT_CLINIC_OR_DEPARTMENT_OTHER): Payer: Self-pay | Admitting: Physical Therapy

## 2023-05-13 ENCOUNTER — Ambulatory Visit (HOSPITAL_BASED_OUTPATIENT_CLINIC_OR_DEPARTMENT_OTHER): Admitting: Physical Therapy

## 2023-05-13 DIAGNOSIS — M542 Cervicalgia: Secondary | ICD-10-CM

## 2023-05-13 DIAGNOSIS — M5459 Other low back pain: Secondary | ICD-10-CM | POA: Diagnosis not present

## 2023-05-17 ENCOUNTER — Encounter (HOSPITAL_BASED_OUTPATIENT_CLINIC_OR_DEPARTMENT_OTHER): Payer: Self-pay | Admitting: Physical Therapy

## 2023-05-17 ENCOUNTER — Ambulatory Visit (HOSPITAL_BASED_OUTPATIENT_CLINIC_OR_DEPARTMENT_OTHER): Admitting: Physical Therapy

## 2023-05-17 DIAGNOSIS — M542 Cervicalgia: Secondary | ICD-10-CM

## 2023-05-17 DIAGNOSIS — M5459 Other low back pain: Secondary | ICD-10-CM

## 2023-05-17 NOTE — Therapy (Signed)
 OUTPATIENT PHYSICAL THERAPY TREATMENT   Patient Name: Ashley Murphy MRN: [id number] DOB:1965-02-17, 58 y.o., female Today's Date: 05/17/2023  END OF SESSION:  PT End of Session - 05/17/23 1440     Visit Number 7    Number of Visits 17    Date for PT Re-Evaluation 06/16/23    Authorization Type [employer]    PT Start Time 1439    PT Stop Time 1519    PT Time Calculation (min) 40 min    Activity Tolerance Patient tolerated treatment well    Behavior During Therapy WFL for tasks assessed/performed               Past Medical History:  Diagnosis Date   Anemia    Anxiety    Asthma    Family history of colon cancer    Family history of melanoma    Thyroid disease    hypothyroid   Urinary incontinence    not severe   Past Surgical History:  Procedure Laterality Date   ANKLE ARTHROSCOPY WITH RECONSTRUCTION Right    CESAREAN SECTION     Patient Active Problem List   Diagnosis Date Noted   Acute non-recurrent pansinusitis 07/22/2022   Encounter for annual physical exam 07/08/2022   Elevated LDL cholesterol level 07/08/2022   Family history of melanoma 09/22/2020   Family history of colon cancer 09/22/2020   Granuloma annulare 06/10/2017   Melanoma of skin (HCC) 06/10/2017   Ankle fracture, right 07/28/2015   Primary hypothyroidism 07/28/2015   Restless legs syndrome (RLS) 07/28/2015   Extrinsic asthma 07/28/2015    PCP: Bary Leriche, PA-C  REFERRING PROVIDER: Bedelia Person, MD  REFERRING DIAG: M54.2 (ICD-10-CM) - Cervicalgia  Rationale for Evaluation and Treatment: Rehabilitation  THERAPY DIAG:  Other low back pain  Cervicalgia  ONSET DATE: neck pain for a few years, back pain since August 2024  SUBJECTIVE:                                                                                                                                                                                           SUBJECTIVE STATEMENT: Patient states back is  feeling good. Neck felt good after last time. Has to go to a wedding and is worried due to sitting, walking in heels.     EVAL: Pt arrives w/ referral for cervicalgia although paper referral also mentions PT for low back.  Pt states neck has been on ongoing issue for several years, feels relatively stable although it does limit her. States she has had PT in the past which improved mobility some, has continued with some of her  exercises. Pt states her back is a newer issue and her more pressing concern. States it began last year, initially fluctuating. States towards the end of last year she was on a boat that had some heavy jolting while hitting wakes and seemed to consistently worsen afterwards. She is now having increased difficulty w/ majority of movement and prolonged positioning. She states most of her pain is around belt line R>L but will sometimes radiate superiorly or into hips. She notes that neck and back both tend to have better tolerance if she is in "just the right spot". States she feels guarded when she moves/walks, historically has been fairly active but has limited activity due to pain. Occasional pain/weakness in BIL LE, R>L. Occasional N/T as well. She denies any saddle anesthesia or sensory changes. Has known history of mild urinary incontinence but states this is stable and unchanging.    PERTINENT HISTORY:  anxiety, asthma, hx incontinence  PAIN:  Are you having pain: 2/10 back, neck 3-4/10   Per eval - Location/description: neck (upper neck, R>L) pulling and tight feeling ; R>L belt line, occasionally goes up and has random pains down legs Best-worst over past week: neck 1-5/10 ; back 3-9/10 - aggravating factors: sitting ; looking up ; bending ; lower body dressing ; standing > a couple minutes ; walking inclines/declines - Easing factors: rest breaking, stretching    PRECAUTIONS: None   WEIGHT BEARING RESTRICTIONS: No  FALLS:  Has patient fallen in last 6 months?  No  LIVING ENVIRONMENT: 2 story home, main level livable ; some difficulty with stair navigation (d/t ankle issues and back pain) has rails at home Lives w/ husband and son Housework split - pt states she usually does typical cleaning, cooking  OCCUPATION: works as Emergency planning/management officer, mostly in office, lots of sitting   PLOF: Independent  PATIENT GOALS: feel better, particularly with her back   NEXT MD VISIT: mid April   OBJECTIVE:  Note: Objective measures were completed at Evaluation unless otherwise noted.  DIAGNOSTIC FINDINGS:  03/08/23 Lumbar XR "IMPRESSION: 1. Degenerative disc disease at L3-L4. 2. Trace retrolisthesis of L2 on L3 and L3 on L4."  PATIENT SURVEYS:  NDI: 28/50 ; 56%   COGNITION: Overall cognitive status: Within functional limits for tasks assessed     SENSATION/NEURO: Light touch intact BIL LE No clonus either LE Negative hoffmann and tromner sign BIL No ataxia with gait   POSTURE: increased lumbar lordosis, fwd head posture, R UT more elevated than L and mild lateral shift towards L   CERVICAL ROM:    A/PROM  eval AROM 05/13/23  Flexion 100%   Extension 75% s   Right lateral flexion 50% R sided pain   Left lateral flexion 50% R sided pain   Right rotation 43 deg 58 deg  Left rotation 55 deg 53 deg   (Blank rows = not tested) (Key: WFL = within functional limits not formally assessed, * = concordant pain, s = stiffness/stretching sensation, NT = not tested)  Comments:    LUMBAR ROM:   AROM eval  Flexion Able to touch feet with relieving R sided stretch   Extension 50% *   Right lateral flexion Just above knee, painless   Left lateral flexion Mid thigh R sided pain   Right rotation 100% soreness   Left rotation 100% soreness    (Blank rows = not tested) (Key: WFL = within functional limits not formally assessed, * = concordant pain, s = stiffness/stretching sensation, NT =  not tested) Comments: flexion is relieving in clinic today  although pt notes oftentimes returning to neutral from flexion is painful  RANGE OF MOTION:      Right eval Left eval  Shoulder flexion    Functional ER combo    Functional IR combo    Knee extension    Ankle dorsiflexion     (Blank rows = not tested) (Key: WFL = within functional limits not formally assessed, * = concordant pain, s = stiffness/stretching sensation, NT = not tested)  Comments: full shoulder flex/abd ROM without pain   STRENGTH TESTING:  MMT Right eval Left eval  Shoulder flexion 5 5  Shoulder abduction 5 5  Shoulder extension 5 5  Elbow flexion    Elbow extension    Grip strength (gross)    Hip flexion 4 4 *  Hip abduction (modified sitting) 4 4  Knee flexion 4 4  Knee extension 4+ 4+  Ankle dorsiflexion 4+ 4+  Ankle plantarflexion     (Blank rows = not tested) (Key: WFL = within functional limits not formally assessed, * = concordant pain, s = stiffness/stretching sensation, NT = not tested)  Comments:     FUNCTIONAL TESTS:  5xSTS: 20.08sec no UE support; rigid trunk mechanics with reduced fwd weightshifting  GAIT: Distance walked: within clinic Assistive device utilized: None Level of assistance: Complete Independence Comments: reduced truncal rotation and arm swing, mildly reduced cadence  TREATMENT DATE:  05/17/23 Manual: STM lower lumbar paraspinals, grade II - III CPA L3-L5 Standing lumbar extension 1 x 10 Palof press RTB 2 x 15 Resisted lift/chops RTB 2 x 10 each   [facility] Adult PT Treatment:                                                DATE: 05/13/23 Therapeutic Exercise: Supine cervical rotations x5 BIL cues for appropriate sensation and ROM Short lever open books x8 BIL  Standing cervical retraction w/ ball at wall 2x10 cues for reduced compensations  Manual Therapy: Supine; STM BIL paracervicals, suboccipitals. STM and trigger point release L LS with gentle passive pin and stretch Prone: STM BIL superior glutes and lumbar  paraspinals  Neuromuscular re-ed: 4 inch runner step up x8 BIL cues for sequencing and posture 4 inch runner step up + OH press (unweighted) x8 BIL, 1# x8 BIL, cues for pacing, posture, and appropriate mechanics    [facility] Adult PT Treatment:                                                DATE: 05/10/23 Therapeutic Exercise: Short lever open books 2x5 BIL w/ breath control  SKTC hooklying 2x30sec BIL  DKTC 2x30sec   Neuromuscular re-ed: Bridge x12 Bridge + ball squeeze x10 Hooklying red band pull down 2x8 cues for core contraction  Manual Therapy: Prone; STM BIL lumbar paraspinals, QL, superior glute; R>L    05/03/23 Manual: STM lower lumbar paraspinals, grade II CPA L4-5, grade II R UPA L4-5 Sidelying hip abduction 2 x 10 Prone hip extension 2 x 10 DKTC with heels on green ball 10 x 5-10 second holds Supine ab iso with ball 10 x 5 second holds Standing Row RTB 2 x 12 Standing shoulder  extension RTB 2 x 12 Palof press 2 x 12 STS with RTB at knees  04/29/23 Trigger Point Dry Needling  Initial Treatment: Pt instructed on Dry Needling rational, procedures, and possible side effects. Pt instructed to expect mild to moderate muscle soreness later in the day and/or into the next day.  Pt instructed in methods to reduce muscle soreness. Pt instructed to continue prescribed HEP. Because Dry Needling was performed over or adjacent to a lung field, pt was educated on S/S of pneumothorax and to seek immediate medical attention should they occur.  Patient was educated on signs and symptoms of infection and other risk factors and advised to seek medical attention should they occur.  Patient verbalized understanding of these instructions and education.   Patient Verbal Consent Given: Yes Education Handout Provided: Yes Muscles Treated: bilateral gluteals 2x each bilateral L5 parspinals 1x each using a .30x50 needle  Electrical Stimulation Performed: No Treatment Response/Outcome: great  twitch    Manual:  Skilled palpation of trigger points  Trigger point release to bilateral gluteals and lower lumbar spine Review =ed self trigger point release using tennis ball   Neuro-re-ed:   Row red 2x12  Shoulder extension 2x12   All performed with education on TA breathing. Reviewed the role of TA in spinal stabilization.   04/26/23 POE  1 minute Press up 2 x 10 Standing lumbar extension 2 x 10 Manual: STM lower lumbar paraspinals, grade II CPA L4-5 PPT 10 x 5 second holds PPT with march 2 x 10 LTR 10 x 5-10 second holds Bridge 2 x 10 Sidelying hip abduction 2 x 10 Prone hip extension 1 x 10   PATIENT EDUCATION:  Education details: rationale for interventions, HEP  Person educated: Patient Education method: Explanation, Demonstration, Tactile cues, Verbal cues Education comprehension: verbalized understanding, returned demonstration, verbal cues required, tactile cues required, and needs further education     HOME EXERCISE PROGRAM: Access Code: [email or web address] URL: [email or web address] Date: 04/21/2023 - Supine Posterior Pelvic Tilt  - 2-3 x daily - 1 sets - 8 reps - Supine March  - 2-3 x daily - 1 sets - 8 reps - Supine Cervical Rotation AROM on Pillow  - 2-3 x daily - 1 sets - 8 reps  04/26/23 - Standing Lumbar Extension  - 3 x daily - 7 x weekly - 1-2 sets - 10 reps - Seated Correct Posture  - 1 x daily - 7 x weekly - Supine Lower Trunk Rotation  - 1 x daily - 7 x weekly - 10 reps - 5-10 second hold - Supine Bridge  - 1 x daily - 7 x weekly - 2 sets - 10 reps  ASSESSMENT:  CLINICAL IMPRESSION: Patient with continued tenderness and hypomobility in lumbar spine. Improves with manual. Educated on trial of more extension based exercises. Continued with core strength which is tolerated well. Moderate fatigue at EOS. Patient stating about 40% improvement up to this point. Patient will continue to benefit from physical therapy in order to improve function  and reduce impairment.    Eval: Patient is a pleasant 59 y.o. woman who was seen today for physical therapy evaluation and treatment for neck/back pain. The former is chronic in nature and pt states is limiting, but has been stable for past few years. She states back pain is newer and more limiting, tends to affect majority of daily tasks/mobility and has culminated in reduced activity levels. On exam she demonstrates concordant limitations in cervical and lumbar mobility, reduced LE  strength, and altered transfer mechanics. 5xSTS is indicative of fall risk and reduced functional mobility. Red flag screening reassuring today. Pt tolerates exam/HEP well overall without adverse event or increase in pain. Recommend trial of skilled PT to address aforementioned deficits with aim of improving functional tolerance and reducing pain with typical activities. Pt departs today's session in no acute distress, all voiced concerns/questions addressed appropriately from PT perspective.    OBJECTIVE IMPAIRMENTS: decreased activity tolerance, decreased endurance, decreased mobility, difficulty walking, decreased ROM, decreased strength, impaired perceived functional ability, improper body mechanics, postural dysfunction, and pain.   ACTIVITY LIMITATIONS: carrying, lifting, bending, sitting, standing, squatting, sleeping, transfers, dressing, and locomotion level  PARTICIPATION LIMITATIONS: meal prep, cleaning, laundry, driving, community activity, and occupation  PERSONAL FACTORS: Time since onset of injury/illness/exacerbation and 1-2 comorbidities: anxiety, asthma  are also affecting patient's functional outcome.   REHAB POTENTIAL: Good  CLINICAL DECISION MAKING: Evolving/moderate complexity  EVALUATION COMPLEXITY: Moderate   GOALS:  SHORT TERM GOALS: Target date: 05/19/2023  Pt will demonstrate appropriate understanding and performance of initially prescribed HEP in order to facilitate improved  independence with management of symptoms.  Baseline: HEP established  Goal status: INITIAL   2. Pt will report at least 25% improvement in overall pain levels over past week in order to facilitate improved tolerance to typical daily activities.   Baseline: neck 1-5/10 ; back 3-9/10   Goal status: INITIAL    LONG TERM GOALS: Target date: 06/16/2023   Pt will score less than or equal to 15/50 on NDI in order to demonstrate improved perception of function due to symptoms (MDC 10-13 pts per Loma Sender al 2009, 2010). Baseline: 28/50 Goal status: INITIAL   2.  Pt will demonstrate full/painless lumbar sidebending/extension AROM in order to demonstrate improved tolerance to functional movement patterns.   Baseline: see ROM chart above Goal status: INITIAL  3.  Pt will demonstrate hip flex/abd MMT of at least 4+/5 bilaterally in order to demonstrate improved strength for functional movements.  Baseline: see MMT chart above Goal status: INITIAL  4. Pt will perform 5xSTS in <12 sec in order to demonstrate reduced fall risk and improved functional independence. (MCID of 2.3sec)  Baseline: 20sec  Goal status: INITIAL   5. Pt will report at least 50% decrease in overall pain levels in past week in order to facilitate improved tolerance to basic ADLs/mobility.   Baseline: neck 1-5/10 ; back 3-9/10   Goal status: INITIAL    6. Pt will endorse ability to perform lower body dressing with less than 3 pt increase in pain in order to facilitate improved tolerance to ADLs.  Baseline: inc pain w/ LBD  Goal status: INITIAL  7. Pt will endorse ability to stand/walk for >53min with no more than 1 rest break in order to facilitate improved tolerance to household tasks.  Baseline: inc pain w/ standing > a couple minutes  Goal status: INITIAL   PLAN:  PT FREQUENCY: 1-2x/week  PT DURATION: 8 weeks  PLANNED INTERVENTIONS: 97164- PT Re-evaluation, 97110-Therapeutic exercises, 97530- Therapeutic activity,  97112- Neuromuscular re-education, 97535- Self Care, 16109- Manual therapy, [phone]- Gait training, [phone]- Aquatic Therapy, [phone]- Electrical stimulation (unattended), Patient/Family education, Balance training, Stair training, Taping, Dry Needling, Joint mobilization, Spinal mobilization, Cryotherapy, and Moist heat.  PLAN FOR NEXT SESSION: Review/update HEP PRN. Work on [employer] exercises as appropriate with emphasis on comfortable mobility, core stability, and LE strengthening. Symptom modification strategies as indicated/appropriate.     Reola Mosher Shailey Butterbaugh, PT 05/17/2023, 2:41 PM

## 2023-05-19 ENCOUNTER — Ambulatory Visit (HOSPITAL_BASED_OUTPATIENT_CLINIC_OR_DEPARTMENT_OTHER): Admitting: Physical Therapy

## 2023-05-19 ENCOUNTER — Encounter (HOSPITAL_BASED_OUTPATIENT_CLINIC_OR_DEPARTMENT_OTHER): Payer: Self-pay | Admitting: Physical Therapy

## 2023-05-19 DIAGNOSIS — M542 Cervicalgia: Secondary | ICD-10-CM

## 2023-05-19 DIAGNOSIS — M5459 Other low back pain: Secondary | ICD-10-CM

## 2023-05-19 NOTE — Therapy (Signed)
 OUTPATIENT PHYSICAL THERAPY TREATMENT   Patient Name: Ashley Murphy MRN: [id number] DOB:April 05, 1965, 58 y.o., female Today's Date: 05/19/2023  END OF SESSION:  PT End of Session - 05/19/23 1156     Visit Number 8    Number of Visits 17    Date for PT Re-Evaluation 06/16/23    Authorization Type [employer]    PT Start Time 1154    PT Stop Time 1232    PT Time Calculation (min) 38 min    Activity Tolerance Patient tolerated treatment well    Behavior During Therapy WFL for tasks assessed/performed               Past Medical History:  Diagnosis Date   Anemia    Anxiety    Asthma    Family history of colon cancer    Family history of melanoma    Thyroid disease    hypothyroid   Urinary incontinence    not severe   Past Surgical History:  Procedure Laterality Date   ANKLE ARTHROSCOPY WITH RECONSTRUCTION Right    CESAREAN SECTION     Patient Active Problem List   Diagnosis Date Noted   Acute non-recurrent pansinusitis 07/22/2022   Encounter for annual physical exam 07/08/2022   Elevated LDL cholesterol level 07/08/2022   Family history of melanoma 09/22/2020   Family history of colon cancer 09/22/2020   Granuloma annulare 06/10/2017   Melanoma of skin (HCC) 06/10/2017   Ankle fracture, right 07/28/2015   Primary hypothyroidism 07/28/2015   Restless legs syndrome (RLS) 07/28/2015   Extrinsic asthma 07/28/2015    PCP: Bary Leriche, PA-C  REFERRING PROVIDER: Bedelia Person, MD  REFERRING DIAG: M54.2 (ICD-10-CM) - Cervicalgia  Rationale for Evaluation and Treatment: Rehabilitation  THERAPY DIAG:  Other low back pain  Cervicalgia  ONSET DATE: neck pain for a few years, back pain since August 2024  SUBJECTIVE:                                                                                                                                                                                           SUBJECTIVE STATEMENT: Patient states back felt  good after last session and then had to sit yesterday for a meeting and that bothered her back. Neck still bothering her too but more worried about back. HEP going well. Doing lumbar extension more. Doesn't give immediate relief but does feel helpful.     EVAL: Pt arrives w/ referral for cervicalgia although paper referral also mentions PT for low back.  Pt states neck has been on ongoing issue for several years, feels relatively stable although it does  limit her. States she has had PT in the past which improved mobility some, has continued with some of her exercises. Pt states her back is a newer issue and her more pressing concern. States it began last year, initially fluctuating. States towards the end of last year she was on a boat that had some heavy jolting while hitting wakes and seemed to consistently worsen afterwards. She is now having increased difficulty w/ majority of movement and prolonged positioning. She states most of her pain is around belt line R>L but will sometimes radiate superiorly or into hips. She notes that neck and back both tend to have better tolerance if she is in "just the right spot". States she feels guarded when she moves/walks, historically has been fairly active but has limited activity due to pain. Occasional pain/weakness in BIL LE, R>L. Occasional N/T as well. She denies any saddle anesthesia or sensory changes. Has known history of mild urinary incontinence but states this is stable and unchanging.    PERTINENT HISTORY:  anxiety, asthma, hx incontinence  PAIN:  Are you having pain: 2/10 back, neck 3-4/10   Per eval - Location/description: neck (upper neck, R>L) pulling and tight feeling ; R>L belt line, occasionally goes up and has random pains down legs Best-worst over past week: neck 1-5/10 ; back 3-9/10 - aggravating factors: sitting ; looking up ; bending ; lower body dressing ; standing > a couple minutes ; walking inclines/declines - Easing factors:  rest breaking, stretching    PRECAUTIONS: None   WEIGHT BEARING RESTRICTIONS: No  FALLS:  Has patient fallen in last 6 months? No  LIVING ENVIRONMENT: 2 story home, main level livable ; some difficulty with stair navigation (d/t ankle issues and back pain) has rails at home Lives w/ husband and son Housework split - pt states she usually does typical cleaning, cooking  OCCUPATION: works as Emergency planning/management officer, mostly in office, lots of sitting   PLOF: Independent  PATIENT GOALS: feel better, particularly with her back   NEXT MD VISIT: mid April   OBJECTIVE:  Note: Objective measures were completed at Evaluation unless otherwise noted.  DIAGNOSTIC FINDINGS:  03/08/23 Lumbar XR "IMPRESSION: 1. Degenerative disc disease at L3-L4. 2. Trace retrolisthesis of L2 on L3 and L3 on L4."  PATIENT SURVEYS:  NDI: 28/50 ; 56%   COGNITION: Overall cognitive status: Within functional limits for tasks assessed     SENSATION/NEURO: Light touch intact BIL LE No clonus either LE Negative hoffmann and tromner sign BIL No ataxia with gait   POSTURE: increased lumbar lordosis, fwd head posture, R UT more elevated than L and mild lateral shift towards L   CERVICAL ROM:    A/PROM  eval AROM 05/13/23  Flexion 100%   Extension 75% s   Right lateral flexion 50% R sided pain   Left lateral flexion 50% R sided pain   Right rotation 43 deg 58 deg  Left rotation 55 deg 53 deg   (Blank rows = not tested) (Key: WFL = within functional limits not formally assessed, * = concordant pain, s = stiffness/stretching sensation, NT = not tested)  Comments:    LUMBAR ROM:   AROM eval  Flexion Able to touch feet with relieving R sided stretch   Extension 50% *   Right lateral flexion Just above knee, painless   Left lateral flexion Mid thigh R sided pain   Right rotation 100% soreness   Left rotation 100% soreness    (Blank rows =  not tested) (Key: WFL = within functional limits not  formally assessed, * = concordant pain, s = stiffness/stretching sensation, NT = not tested) Comments: flexion is relieving in clinic today although pt notes oftentimes returning to neutral from flexion is painful  RANGE OF MOTION:      Right eval Left eval  Shoulder flexion    Functional ER combo    Functional IR combo    Knee extension    Ankle dorsiflexion     (Blank rows = not tested) (Key: WFL = within functional limits not formally assessed, * = concordant pain, s = stiffness/stretching sensation, NT = not tested)  Comments: full shoulder flex/abd ROM without pain   STRENGTH TESTING:  MMT Right eval Left eval  Shoulder flexion 5 5  Shoulder abduction 5 5  Shoulder extension 5 5  Elbow flexion    Elbow extension    Grip strength (gross)    Hip flexion 4 4 *  Hip abduction (modified sitting) 4 4  Knee flexion 4 4  Knee extension 4+ 4+  Ankle dorsiflexion 4+ 4+  Ankle plantarflexion     (Blank rows = not tested) (Key: WFL = within functional limits not formally assessed, * = concordant pain, s = stiffness/stretching sensation, NT = not tested)  Comments:     FUNCTIONAL TESTS:  5xSTS: 20.08sec no UE support; rigid trunk mechanics with reduced fwd weightshifting  GAIT: Distance walked: within clinic Assistive device utilized: None Level of assistance: Complete Independence Comments: reduced truncal rotation and arm swing, mildly reduced cadence  TREATMENT DATE:  05/19/23 Manual: STM lower lumbar paraspinals, grade II - III CPA L3-L5 Press up 2 x 10 Standing lumbar extension 1 x 10 Standing lumbar ext at counter with hip dip 1 x 10  STS with RTB at knees 2 x 10 Palof press RTB 1 x 15  05/17/23 Manual: STM lower lumbar paraspinals, grade II - III CPA L3-L5 Standing lumbar extension 1 x 10 Palof press RTB 2 x 15 Resisted lift/chops RTB 2 x 10 each   [facility] Adult PT Treatment:                                                DATE: 05/13/23 Therapeutic  Exercise: Supine cervical rotations x5 BIL cues for appropriate sensation and ROM Short lever open books x8 BIL  Standing cervical retraction w/ ball at wall 2x10 cues for reduced compensations  Manual Therapy: Supine; STM BIL paracervicals, suboccipitals. STM and trigger point release L LS with gentle passive pin and stretch Prone: STM BIL superior glutes and lumbar paraspinals  Neuromuscular re-ed: 4 inch runner step up x8 BIL cues for sequencing and posture 4 inch runner step up + OH press (unweighted) x8 BIL, 1# x8 BIL, cues for pacing, posture, and appropriate mechanics    [facility] Adult PT Treatment:                                                DATE: 05/10/23 Therapeutic Exercise: Short lever open books 2x5 BIL w/ breath control  SKTC hooklying 2x30sec BIL  DKTC 2x30sec   Neuromuscular re-ed: Bridge x12 Bridge + ball squeeze x10 Hooklying red band pull down 2x8 cues for core contraction  Manual Therapy: Prone; STM BIL lumbar paraspinals, QL, superior glute; R>L    05/03/23 Manual: STM lower lumbar paraspinals, grade II CPA L4-5, grade II R UPA L4-5 Sidelying hip abduction 2 x 10 Prone hip extension 2 x 10 DKTC with heels on green ball 10 x 5-10 second holds Supine ab iso with ball 10 x 5 second holds Standing Row RTB 2 x 12 Standing shoulder extension RTB 2 x 12 Palof press 2 x 12 STS with RTB at knees  04/29/23 Trigger Point Dry Needling  Initial Treatment: Pt instructed on Dry Needling rational, procedures, and possible side effects. Pt instructed to expect mild to moderate muscle soreness later in the day and/or into the next day.  Pt instructed in methods to reduce muscle soreness. Pt instructed to continue prescribed HEP. Because Dry Needling was performed over or adjacent to a lung field, pt was educated on S/S of pneumothorax and to seek immediate medical attention should they occur.  Patient was educated on signs and symptoms of infection and other risk  factors and advised to seek medical attention should they occur.  Patient verbalized understanding of these instructions and education.   Patient Verbal Consent Given: Yes Education Handout Provided: Yes Muscles Treated: bilateral gluteals 2x each bilateral L5 parspinals 1x each using a .30x50 needle  Electrical Stimulation Performed: No Treatment Response/Outcome: great twitch    Manual:  Skilled palpation of trigger points  Trigger point release to bilateral gluteals and lower lumbar spine Review =ed self trigger point release using tennis ball   Neuro-re-ed:   Row red 2x12  Shoulder extension 2x12   All performed with education on TA breathing. Reviewed the role of TA in spinal stabilization.   04/26/23 POE  1 minute Press up 2 x 10 Standing lumbar extension 2 x 10 Manual: STM lower lumbar paraspinals, grade II CPA L4-5 PPT 10 x 5 second holds PPT with march 2 x 10 LTR 10 x 5-10 second holds Bridge 2 x 10 Sidelying hip abduction 2 x 10 Prone hip extension 1 x 10   PATIENT EDUCATION:  Education details: rationale for interventions, HEP  Person educated: Patient Education method: Explanation, Demonstration, Tactile cues, Verbal cues Education comprehension: verbalized understanding, returned demonstration, verbal cues required, tactile cues required, and needs further education     HOME EXERCISE PROGRAM: Access Code: [email or web address] URL: [email or web address] Date: 04/21/2023 - Supine Posterior Pelvic Tilt  - 2-3 x daily - 1 sets - 8 reps - Supine March  - 2-3 x daily - 1 sets - 8 reps - Supine Cervical Rotation AROM on Pillow  - 2-3 x daily - 1 sets - 8 reps  04/26/23 - Standing Lumbar Extension  - 3 x daily - 7 x weekly - 1-2 sets - 10 reps - Seated Correct Posture  - 1 x daily - 7 x weekly - Supine Lower Trunk Rotation  - 1 x daily - 7 x weekly - 10 reps - 5-10 second hold - Supine Bridge  - 1 x daily - 7 x weekly - 2 sets - 10  reps  ASSESSMENT:  CLINICAL IMPRESSION: Patient with continued tender and hypomobile lumbar spine, improves with manual. Symptoms further improve with press up exercise. Appears to do well with extension. Continued with glute and functional strengthening which is tolerated well. Instructed on ways to decrease DOMS. Patient will continue to benefit from physical therapy in order to improve function and reduce impairment.    Eval: Patient is a pleasant 58 y.o.  woman who was seen today for physical therapy evaluation and treatment for neck/back pain. The former is chronic in nature and pt states is limiting, but has been stable for past few years. She states back pain is newer and more limiting, tends to affect majority of daily tasks/mobility and has culminated in reduced activity levels. On exam she demonstrates concordant limitations in cervical and lumbar mobility, reduced LE strength, and altered transfer mechanics. 5xSTS is indicative of fall risk and reduced functional mobility. Red flag screening reassuring today. Pt tolerates exam/HEP well overall without adverse event or increase in pain. Recommend trial of skilled PT to address aforementioned deficits with aim of improving functional tolerance and reducing pain with typical activities. Pt departs today's session in no acute distress, all voiced concerns/questions addressed appropriately from PT perspective.    OBJECTIVE IMPAIRMENTS: decreased activity tolerance, decreased endurance, decreased mobility, difficulty walking, decreased ROM, decreased strength, impaired perceived functional ability, improper body mechanics, postural dysfunction, and pain.   ACTIVITY LIMITATIONS: carrying, lifting, bending, sitting, standing, squatting, sleeping, transfers, dressing, and locomotion level  PARTICIPATION LIMITATIONS: meal prep, cleaning, laundry, driving, community activity, and occupation  PERSONAL FACTORS: Time since onset of  injury/illness/exacerbation and 1-2 comorbidities: anxiety, asthma  are also affecting patient's functional outcome.   REHAB POTENTIAL: Good  CLINICAL DECISION MAKING: Evolving/moderate complexity  EVALUATION COMPLEXITY: Moderate   GOALS:  SHORT TERM GOALS: Target date: 05/19/2023  Pt will demonstrate appropriate understanding and performance of initially prescribed HEP in order to facilitate improved independence with management of symptoms.  Baseline: HEP established  Goal status: INITIAL   2. Pt will report at least 25% improvement in overall pain levels over past week in order to facilitate improved tolerance to typical daily activities.   Baseline: neck 1-5/10 ; back 3-9/10   Goal status: INITIAL    LONG TERM GOALS: Target date: 06/16/2023   Pt will score less than or equal to 15/50 on NDI in order to demonstrate improved perception of function due to symptoms (MDC 10-13 pts per Loma Sender al 2009, 2010). Baseline: 28/50 Goal status: INITIAL   2.  Pt will demonstrate full/painless lumbar sidebending/extension AROM in order to demonstrate improved tolerance to functional movement patterns.   Baseline: see ROM chart above Goal status: INITIAL  3.  Pt will demonstrate hip flex/abd MMT of at least 4+/5 bilaterally in order to demonstrate improved strength for functional movements.  Baseline: see MMT chart above Goal status: INITIAL  4. Pt will perform 5xSTS in <12 sec in order to demonstrate reduced fall risk and improved functional independence. (MCID of 2.3sec)  Baseline: 20sec  Goal status: INITIAL   5. Pt will report at least 50% decrease in overall pain levels in past week in order to facilitate improved tolerance to basic ADLs/mobility.   Baseline: neck 1-5/10 ; back 3-9/10   Goal status: INITIAL    6. Pt will endorse ability to perform lower body dressing with less than 3 pt increase in pain in order to facilitate improved tolerance to ADLs.  Baseline: inc pain w/  LBD  Goal status: INITIAL  7. Pt will endorse ability to stand/walk for >4min with no more than 1 rest break in order to facilitate improved tolerance to household tasks.  Baseline: inc pain w/ standing > a couple minutes  Goal status: INITIAL   PLAN:  PT FREQUENCY: 1-2x/week  PT DURATION: 8 weeks  PLANNED INTERVENTIONS: 97164- PT Re-evaluation, 97110-Therapeutic exercises, 97530- Therapeutic activity, [id number]- Neuromuscular re-education, 97535- Self Care, [id number]-  Manual therapy, [id number]- Gait training, 56433- Aquatic Therapy, [phone]- Electrical stimulation (unattended), Patient/Family education, Balance training, Stair training, Taping, Dry Needling, Joint mobilization, Spinal mobilization, Cryotherapy, and Moist heat.  PLAN FOR NEXT SESSION: Review/update HEP PRN. Work on [employer] exercises as appropriate with emphasis on comfortable mobility, core stability, and LE strengthening. Symptom modification strategies as indicated/appropriate.     Reola Mosher Chaise Mahabir, PT 05/19/2023, 11:57 AM

## 2023-05-20 ENCOUNTER — Encounter (HOSPITAL_BASED_OUTPATIENT_CLINIC_OR_DEPARTMENT_OTHER): Admitting: Physical Therapy

## 2023-05-24 ENCOUNTER — Encounter (HOSPITAL_BASED_OUTPATIENT_CLINIC_OR_DEPARTMENT_OTHER): Payer: Self-pay | Admitting: Physical Therapy

## 2023-05-24 ENCOUNTER — Ambulatory Visit (HOSPITAL_BASED_OUTPATIENT_CLINIC_OR_DEPARTMENT_OTHER): Payer: Self-pay | Admitting: Physical Therapy

## 2023-05-24 DIAGNOSIS — M542 Cervicalgia: Secondary | ICD-10-CM

## 2023-05-24 DIAGNOSIS — M5459 Other low back pain: Secondary | ICD-10-CM | POA: Diagnosis not present

## 2023-05-24 NOTE — Therapy (Signed)
 OUTPATIENT PHYSICAL THERAPY TREATMENT   Patient Name: Ashley Murphy MRN: [id number] DOB:[birth year], 58 y.o., female Today's Date: 05/24/2023  END OF SESSION:  PT End of Session - 05/24/23 0938     Visit Number 9    Number of Visits 17    Date for PT Re-Evaluation 06/16/23    Authorization Type [employer]    PT Start Time [phone]    PT Stop Time 1015    PT Time Calculation (min) 38 min    Activity Tolerance Patient tolerated treatment well    Behavior During Therapy [facility] for tasks assessed/performed               Past Medical History:  Diagnosis Date   Anemia    Anxiety    Asthma    Family history of colon cancer    Family history of melanoma    Thyroid disease    hypothyroid   Urinary incontinence    not severe   Past Surgical History:  Procedure Laterality Date   ANKLE ARTHROSCOPY WITH RECONSTRUCTION Right    CESAREAN SECTION     Patient Active Problem List   Diagnosis Date Noted   Acute non-recurrent pansinusitis 07/22/2022   Encounter for annual physical exam 07/08/2022   Elevated LDL cholesterol level 07/08/2022   Family history of melanoma 09/22/2020   Family history of colon cancer 09/22/2020   Granuloma annulare 06/10/2017   Melanoma of skin (HCC) 06/10/2017   Ankle fracture, right 07/28/2015   Primary hypothyroidism 07/28/2015   Restless legs syndrome (RLS) 07/28/2015   Extrinsic asthma 07/28/2015    PCP: Alda Amas, PA-C  REFERRING PROVIDER: Van Gelinas, MD  REFERRING DIAG: M54.2 (ICD-10-CM) - Cervicalgia  Rationale for Evaluation and Treatment: Rehabilitation  THERAPY DIAG:  Other low back pain  Cervicalgia  ONSET DATE: neck pain for a few years, back pain since August 2024  SUBJECTIVE:                                                                                                                                                                                           SUBJECTIVE STATEMENT: Patient states back is sore  today. Was sore with sitting in the car traveling but the towel was a blessing. Doing HEP without issues.     EVAL: Pt arrives w/ referral for cervicalgia although paper referral also mentions PT for low back.  Pt states neck has been on ongoing issue for several years, feels relatively stable although it does limit her. States she has had PT in the past which improved mobility some, has continued with some of her exercises. Pt states  her back is a newer issue and her more pressing concern. States it began last year, initially fluctuating. States towards the end of last year she was on a boat that had some heavy jolting while hitting wakes and seemed to consistently worsen afterwards. She is now having increased difficulty w/ majority of movement and prolonged positioning. She states most of her pain is around belt line R>L but will sometimes radiate superiorly or into hips. She notes that neck and back both tend to have better tolerance if she is in "just the right spot". States she feels guarded when she moves/walks, historically has been fairly active but has limited activity due to pain. Occasional pain/weakness in BIL LE, R>L. Occasional N/T as well. She denies any saddle anesthesia or sensory changes. Has known history of mild urinary incontinence but states this is stable and unchanging.    PERTINENT HISTORY:  anxiety, asthma, hx incontinence  PAIN:  Are you having pain: 4/10 back, neck 3-4/10   Per eval - Location/description: neck (upper neck, R>L) pulling and tight feeling ; R>L belt line, occasionally goes up and has random pains down legs Best-worst over past week: neck 1-5/10 ; back 3-9/10 - aggravating factors: sitting ; looking up ; bending ; lower body dressing ; standing > a couple minutes ; walking inclines/declines - Easing factors: rest breaking, stretching    PRECAUTIONS: None   WEIGHT BEARING RESTRICTIONS: No  FALLS:  Has patient fallen in last 6 months? No  LIVING  ENVIRONMENT: 2 story home, main level livable ; some difficulty with stair navigation (d/t ankle issues and back pain) has rails at home Lives w/ husband and son Housework split - pt states she usually does typical cleaning, cooking  OCCUPATION: works as Emergency planning/management officer, mostly in office, lots of sitting   PLOF: Independent  PATIENT GOALS: feel better, particularly with her back   NEXT MD VISIT: mid April   OBJECTIVE:  Note: Objective measures were completed at Evaluation unless otherwise noted.  DIAGNOSTIC FINDINGS:  03/08/23 Lumbar XR "IMPRESSION: 1. Degenerative disc disease at L3-L4. 2. Trace retrolisthesis of L2 on L3 and L3 on L4."  PATIENT SURVEYS:  NDI: 28/50 ; 56%   COGNITION: Overall cognitive status: Within functional limits for tasks assessed     SENSATION/NEURO: Light touch intact BIL LE No clonus either LE Negative hoffmann and tromner sign BIL No ataxia with gait   POSTURE: increased lumbar lordosis, fwd head posture, R UT more elevated than L and mild lateral shift towards L   CERVICAL ROM:    A/PROM  eval AROM 05/13/23  Flexion 100%   Extension 75% s   Right lateral flexion 50% R sided pain   Left lateral flexion 50% R sided pain   Right rotation 43 deg 58 deg  Left rotation 55 deg 53 deg   (Blank rows = not tested) (Key: WFL = within functional limits not formally assessed, * = concordant pain, s = stiffness/stretching sensation, NT = not tested)  Comments:    LUMBAR ROM:   AROM eval  Flexion Able to touch feet with relieving R sided stretch   Extension 50% *   Right lateral flexion Just above knee, painless   Left lateral flexion Mid thigh R sided pain   Right rotation 100% soreness   Left rotation 100% soreness    (Blank rows = not tested) (Key: WFL = within functional limits not formally assessed, * = concordant pain, s = stiffness/stretching sensation, NT = not tested)  Comments: flexion is relieving in clinic today although pt  notes oftentimes returning to neutral from flexion is painful  RANGE OF MOTION:      Right eval Left eval  Shoulder flexion    Functional ER combo    Functional IR combo    Knee extension    Ankle dorsiflexion     (Blank rows = not tested) (Key: WFL = within functional limits not formally assessed, * = concordant pain, s = stiffness/stretching sensation, NT = not tested)  Comments: full shoulder flex/abd ROM without pain   STRENGTH TESTING:  MMT Right eval Left eval  Shoulder flexion 5 5  Shoulder abduction 5 5  Shoulder extension 5 5  Elbow flexion    Elbow extension    Grip strength (gross)    Hip flexion 4 4 *  Hip abduction (modified sitting) 4 4  Knee flexion 4 4  Knee extension 4+ 4+  Ankle dorsiflexion 4+ 4+  Ankle plantarflexion     (Blank rows = not tested) (Key: WFL = within functional limits not formally assessed, * = concordant pain, s = stiffness/stretching sensation, NT = not tested)  Comments:     FUNCTIONAL TESTS:  5xSTS: 20.08sec no UE support; rigid trunk mechanics with reduced fwd weightshifting  GAIT: Distance walked: within clinic Assistive device utilized: None Level of assistance: Complete Independence Comments: reduced truncal rotation and arm swing, mildly reduced cadence  TREATMENT DATE:  05/24/23 Manual: STM lower lumbar paraspinals, grade II - III CPA L3-L5, supine manual traction with red swiss ball 4 x 1 minute Supine ab set and PPT with LE extension 2 x 10 Supine 90 - 90 ab set with shoulder flexion 2 x 10  Supine 90 -90 ab set with LE extension 2 x 10  1/2 kneeling hip flexor stretch with PPT/glute set 3 x 20 second holds Tall kneeling hip hinge 2 x 10  05/19/23 Manual: STM lower lumbar paraspinals, grade II - III CPA L3-L5 Press up 2 x 10 Standing lumbar extension 1 x 10 Standing lumbar ext at counter with hip dip 1 x 10  STS with RTB at knees 2 x 10 Palof press RTB 1 x 15  05/17/23 Manual: STM lower lumbar paraspinals,  grade II - III CPA L3-L5 Standing lumbar extension 1 x 10 Palof press RTB 2 x 15 Resisted lift/chops RTB 2 x 10 each   [facility] Adult PT Treatment:                                                DATE: 05/13/23 Therapeutic Exercise: Supine cervical rotations x5 BIL cues for appropriate sensation and ROM Short lever open books x8 BIL  Standing cervical retraction w/ ball at wall 2x10 cues for reduced compensations  Manual Therapy: Supine; STM BIL paracervicals, suboccipitals. STM and trigger point release L LS with gentle passive pin and stretch Prone: STM BIL superior glutes and lumbar paraspinals  Neuromuscular re-ed: 4 inch runner step up x8 BIL cues for sequencing and posture 4 inch runner step up + OH press (unweighted) x8 BIL, 1# x8 BIL, cues for pacing, posture, and appropriate mechanics    [facility] Adult PT Treatment:  DATE: 05/10/23 Therapeutic Exercise: Short lever open books 2x5 BIL w/ breath control  SKTC hooklying 2x30sec BIL  DKTC 2x30sec   Neuromuscular re-ed: Bridge x12 Bridge + ball squeeze x10 Hooklying red band pull down 2x8 cues for core contraction  Manual Therapy: Prone; STM BIL lumbar paraspinals, QL, superior glute; R>L    05/03/23 Manual: STM lower lumbar paraspinals, grade II CPA L4-5, grade II R UPA L4-5 Sidelying hip abduction 2 x 10 Prone hip extension 2 x 10 DKTC with heels on green ball 10 x 5-10 second holds Supine ab iso with ball 10 x 5 second holds Standing Row RTB 2 x 12 Standing shoulder extension RTB 2 x 12 Palof press 2 x 12 STS with RTB at knees    PATIENT EDUCATION:  Education details: rationale for interventions, HEP  Person educated: Patient Education method: Explanation, Demonstration, Tactile cues, Verbal cues Education comprehension: verbalized understanding, returned demonstration, verbal cues required, tactile cues required, and needs further education     HOME EXERCISE  PROGRAM: Access Code: [email or web address] URL: [email or web address] Date: 04/21/2023 - Supine Posterior Pelvic Tilt  - 2-3 x daily - 1 sets - 8 reps - Supine March  - 2-3 x daily - 1 sets - 8 reps - Supine Cervical Rotation AROM on Pillow  - 2-3 x daily - 1 sets - 8 reps  04/26/23 - Standing Lumbar Extension  - 3 x daily - 7 x weekly - 1-2 sets - 10 reps - Seated Correct Posture  - 1 x daily - 7 x weekly - Supine Lower Trunk Rotation  - 1 x daily - 7 x weekly - 10 reps - 5-10 second hold - Supine Bridge  - 1 x daily - 7 x weekly - 2 sets - 10 reps  ASSESSMENT:  CLINICAL IMPRESSION: Patient with continued tender and hypomobile lumbar spine, improves with manual. No change with manual traction. Additional core strengthening performed which is tolerated well. Improvement in symptoms at EOS. Patient will continue to benefit from physical therapy in order to improve function and reduce impairment.    Eval: Patient is a pleasant 58 y.o. woman who was seen today for physical therapy evaluation and treatment for neck/back pain. The former is chronic in nature and pt states is limiting, but has been stable for past few years. She states back pain is newer and more limiting, tends to affect majority of daily tasks/mobility and has culminated in reduced activity levels. On exam she demonstrates concordant limitations in cervical and lumbar mobility, reduced LE strength, and altered transfer mechanics. 5xSTS is indicative of fall risk and reduced functional mobility. Red flag screening reassuring today. Pt tolerates exam/HEP well overall without adverse event or increase in pain. Recommend trial of skilled PT to address aforementioned deficits with aim of improving functional tolerance and reducing pain with typical activities. Pt departs today's session in no acute distress, all voiced concerns/questions addressed appropriately from PT perspective.    OBJECTIVE IMPAIRMENTS: decreased activity  tolerance, decreased endurance, decreased mobility, difficulty walking, decreased ROM, decreased strength, impaired perceived functional ability, improper body mechanics, postural dysfunction, and pain.   ACTIVITY LIMITATIONS: carrying, lifting, bending, sitting, standing, squatting, sleeping, transfers, dressing, and locomotion level  PARTICIPATION LIMITATIONS: meal prep, cleaning, laundry, driving, community activity, and occupation  PERSONAL FACTORS: Time since onset of injury/illness/exacerbation and 1-2 comorbidities: anxiety, asthma  are also affecting patient's functional outcome.   REHAB POTENTIAL: Good  CLINICAL DECISION MAKING: Evolving/moderate complexity  EVALUATION COMPLEXITY: Moderate   GOALS:  SHORT  TERM GOALS: Target date: 05/19/2023  Pt will demonstrate appropriate understanding and performance of initially prescribed HEP in order to facilitate improved independence with management of symptoms.  Baseline: HEP established  Goal status: INITIAL   2. Pt will report at least 25% improvement in overall pain levels over past week in order to facilitate improved tolerance to typical daily activities.   Baseline: neck 1-5/10 ; back 3-9/10   Goal status: INITIAL    LONG TERM GOALS: Target date: 06/16/2023   Pt will score less than or equal to 15/50 on NDI in order to demonstrate improved perception of function due to symptoms (MDC 10-13 pts per Adline Hook al 2009, 2010). Baseline: 28/50 Goal status: INITIAL   2.  Pt will demonstrate full/painless lumbar sidebending/extension AROM in order to demonstrate improved tolerance to functional movement patterns.   Baseline: see ROM chart above Goal status: INITIAL  3.  Pt will demonstrate hip flex/abd MMT of at least 4+/5 bilaterally in order to demonstrate improved strength for functional movements.  Baseline: see MMT chart above Goal status: INITIAL  4. Pt will perform 5xSTS in <12 sec in order to demonstrate reduced fall risk  and improved functional independence. (MCID of 2.3sec)  Baseline: 20sec  Goal status: INITIAL   5. Pt will report at least 50% decrease in overall pain levels in past week in order to facilitate improved tolerance to basic ADLs/mobility.   Baseline: neck 1-5/10 ; back 3-9/10   Goal status: INITIAL    6. Pt will endorse ability to perform lower body dressing with less than 3 pt increase in pain in order to facilitate improved tolerance to ADLs.  Baseline: inc pain w/ LBD  Goal status: INITIAL  7. Pt will endorse ability to stand/walk for >10min with no more than 1 rest break in order to facilitate improved tolerance to household tasks.  Baseline: inc pain w/ standing > a couple minutes  Goal status: INITIAL   PLAN:  PT FREQUENCY: 1-2x/week  PT DURATION: 8 weeks  PLANNED INTERVENTIONS: 97164- PT Re-evaluation, 97110-Therapeutic exercises, 97530- Therapeutic activity, 97112- Neuromuscular re-education, 97535- Self Care, [id number]- Manual therapy, [phone]- Gait training, [phone]- Aquatic Therapy, [phone]- Electrical stimulation (unattended), Patient/Family education, Balance training, Stair training, Taping, Dry Needling, Joint mobilization, Spinal mobilization, Cryotherapy, and Moist heat.  PLAN FOR NEXT SESSION: Review/update HEP PRN. Work on [employer] exercises as appropriate with emphasis on comfortable mobility, core stability, and LE strengthening. Symptom modification strategies as indicated/appropriate.     Perfecto Bracket Auden Tatar, PT 05/24/2023, 10:11 AM

## 2023-05-30 DIAGNOSIS — M47816 Spondylosis without myelopathy or radiculopathy, lumbar region: Secondary | ICD-10-CM | POA: Diagnosis not present

## 2023-05-31 DIAGNOSIS — L920 Granuloma annulare: Secondary | ICD-10-CM | POA: Diagnosis not present

## 2023-05-31 DIAGNOSIS — D2271 Melanocytic nevi of right lower limb, including hip: Secondary | ICD-10-CM | POA: Diagnosis not present

## 2023-05-31 DIAGNOSIS — D2239 Melanocytic nevi of other parts of face: Secondary | ICD-10-CM | POA: Diagnosis not present

## 2023-05-31 DIAGNOSIS — Z85820 Personal history of malignant melanoma of skin: Secondary | ICD-10-CM | POA: Diagnosis not present

## 2023-05-31 DIAGNOSIS — D235 Other benign neoplasm of skin of trunk: Secondary | ICD-10-CM | POA: Diagnosis not present

## 2023-05-31 DIAGNOSIS — D485 Neoplasm of uncertain behavior of skin: Secondary | ICD-10-CM | POA: Diagnosis not present

## 2023-05-31 DIAGNOSIS — Z79899 Other long term (current) drug therapy: Secondary | ICD-10-CM | POA: Diagnosis not present

## 2023-06-02 ENCOUNTER — Encounter (HOSPITAL_BASED_OUTPATIENT_CLINIC_OR_DEPARTMENT_OTHER): Payer: Self-pay | Admitting: Physical Therapy

## 2023-06-02 ENCOUNTER — Ambulatory Visit (HOSPITAL_BASED_OUTPATIENT_CLINIC_OR_DEPARTMENT_OTHER): Admitting: Physical Therapy

## 2023-06-02 DIAGNOSIS — M542 Cervicalgia: Secondary | ICD-10-CM

## 2023-06-02 DIAGNOSIS — M5459 Other low back pain: Secondary | ICD-10-CM

## 2023-06-02 NOTE — Therapy (Signed)
 OUTPATIENT PHYSICAL THERAPY TREATMENT   Patient Name: Ashley Murphy MRN: [id number] DOB:1965/09/18, 58 y.o., female Today's Date: 06/02/2023  END OF SESSION:  PT End of Session - 06/02/23 0850     Visit Number 10    Number of Visits 17    Date for PT Re-Evaluation 06/16/23    Authorization Type [employer]    PT Start Time 0850    PT Stop Time 0930    PT Time Calculation (min) 40 min    Activity Tolerance Patient tolerated treatment well    Behavior During Therapy [employer] for tasks assessed/performed               Past Medical History:  Diagnosis Date   Anemia    Anxiety    Asthma    Family history of colon cancer    Family history of melanoma    Thyroid  disease    hypothyroid   Urinary incontinence    not severe   Past Surgical History:  Procedure Laterality Date   ANKLE ARTHROSCOPY WITH RECONSTRUCTION Right    CESAREAN SECTION     Patient Active Problem List   Diagnosis Date Noted   Acute non-recurrent pansinusitis 07/22/2022   Encounter for annual physical exam 07/08/2022   Elevated LDL cholesterol level 07/08/2022   Family history of melanoma 09/22/2020   Family history of colon cancer 09/22/2020   Granuloma annulare 06/10/2017   Melanoma of skin (HCC) 06/10/2017   Ankle fracture, right 07/28/2015   Primary hypothyroidism 07/28/2015   Restless legs syndrome (RLS) 07/28/2015   Extrinsic asthma 07/28/2015    PCP: Alda Amas, PA-C  REFERRING PROVIDER: Van Gelinas, MD  REFERRING DIAG: M54.2 (ICD-10-CM) - Cervicalgia  Rationale for Evaluation and Treatment: Rehabilitation  THERAPY DIAG:  Other low back pain  Cervicalgia  ONSET DATE: neck pain for a few years, back pain since August 2024  SUBJECTIVE:                                                                                                                                                                                           SUBJECTIVE STATEMENT: Patient states back is  pretty sore. Neck is bothering her a lot today. Only had one session with focus on the neck. Mostly bothers her with rotation. Pain from head to shoulder blade (points to levator scapulae region).     EVAL: Pt arrives w/ referral for cervicalgia although paper referral also mentions PT for low back.  Pt states neck has been on ongoing issue for several years, feels relatively stable although it does limit her. States she has had PT in the  past which improved mobility some, has continued with some of her exercises. Pt states her back is a newer issue and her more pressing concern. States it began last year, initially fluctuating. States towards the end of last year she was on a boat that had some heavy jolting while hitting wakes and seemed to consistently worsen afterwards. She is now having increased difficulty w/ majority of movement and prolonged positioning. She states most of her pain is around belt line R>L but will sometimes radiate superiorly or into hips. She notes that neck and back both tend to have better tolerance if she is in "just the right spot". States she feels guarded when she moves/walks, historically has been fairly active but has limited activity due to pain. Occasional pain/weakness in BIL LE, R>L. Occasional N/T as well. She denies any saddle anesthesia or sensory changes. Has known history of mild urinary incontinence but states this is stable and unchanging.    PERTINENT HISTORY:  anxiety, asthma, hx incontinence  PAIN:  Are you having pain: 3/10 back, neck 4/10   Per eval - Location/description: neck (upper neck, R>L) pulling and tight feeling ; R>L belt line, occasionally goes up and has random pains down legs Best-worst over past week: neck 1-5/10 ; back 3-9/10 - aggravating factors: sitting ; looking up ; bending ; lower body dressing ; standing > a couple minutes ; walking inclines/declines - Easing factors: rest breaking, stretching    PRECAUTIONS:  None   WEIGHT BEARING RESTRICTIONS: No  FALLS:  Has patient fallen in last 6 months? No  LIVING ENVIRONMENT: 2 story home, main level livable ; some difficulty with stair navigation (d/t ankle issues and back pain) has rails at home Lives w/ husband and son Housework split - pt states she usually does typical cleaning, cooking  OCCUPATION: works as Emergency planning/management officer, mostly in office, lots of sitting   PLOF: Independent  PATIENT GOALS: feel better, particularly with her back   NEXT MD VISIT: mid April   OBJECTIVE:  Note: Objective measures were completed at Evaluation unless otherwise noted.  DIAGNOSTIC FINDINGS:  03/08/23 Lumbar XR "IMPRESSION: 1. Degenerative disc disease at L3-L4. 2. Trace retrolisthesis of L2 on L3 and L3 on L4."  PATIENT SURVEYS:  NDI: 28/50 ; 56%   COGNITION: Overall cognitive status: Within functional limits for tasks assessed     SENSATION/NEURO: Light touch intact BIL LE No clonus either LE Negative hoffmann and tromner sign BIL No ataxia with gait   POSTURE: increased lumbar lordosis, fwd head posture, R UT more elevated than L and mild lateral shift towards L   CERVICAL ROM:    A/PROM  eval AROM 05/13/23  Flexion 100%   Extension 75% s   Right lateral flexion 50% R sided pain   Left lateral flexion 50% R sided pain   Right rotation 43 deg 58 deg  Left rotation 55 deg 53 deg   (Blank rows = not tested) (Key: WFL = within functional limits not formally assessed, * = concordant pain, s = stiffness/stretching sensation, NT = not tested)  Comments:    LUMBAR ROM:   AROM eval  Flexion Able to touch feet with relieving R sided stretch   Extension 50% *   Right lateral flexion Just above knee, painless   Left lateral flexion Mid thigh R sided pain   Right rotation 100% soreness   Left rotation 100% soreness    (Blank rows = not tested) (Key: WFL = within functional limits not  formally assessed, * = concordant pain, s =  stiffness/stretching sensation, NT = not tested) Comments: flexion is relieving in clinic today although pt notes oftentimes returning to neutral from flexion is painful  RANGE OF MOTION:      Right eval Left eval  Shoulder flexion    Functional ER combo    Functional IR combo    Knee extension    Ankle dorsiflexion     (Blank rows = not tested) (Key: WFL = within functional limits not formally assessed, * = concordant pain, s = stiffness/stretching sensation, NT = not tested)  Comments: full shoulder flex/abd ROM without pain   STRENGTH TESTING:  MMT Right eval Left eval  Shoulder flexion 5 5  Shoulder abduction 5 5  Shoulder extension 5 5  Elbow flexion    Elbow extension    Grip strength (gross)    Hip flexion 4 4 *  Hip abduction (modified sitting) 4 4  Knee flexion 4 4  Knee extension 4+ 4+  Ankle dorsiflexion 4+ 4+  Ankle plantarflexion     (Blank rows = not tested) (Key: WFL = within functional limits not formally assessed, * = concordant pain, s = stiffness/stretching sensation, NT = not tested)  Comments:     FUNCTIONAL TESTS:  5xSTS: 20.08sec no UE support; rigid trunk mechanics with reduced fwd weightshifting  GAIT: Distance walked: within clinic Assistive device utilized: None Level of assistance: Complete Independence Comments: reduced truncal rotation and arm swing, mildly reduced cadence  TREATMENT DATE:  06/02/23 Manual: STM to suboccipitals, levator scapulae, UT Seated levator scapulae stretch 3 x 20 second holds Seated UT stretch 3 x 20 second holds Supine cervical retraction 2 x 10   05/24/23 Manual: STM lower lumbar paraspinals, grade II - III CPA L3-L5, supine manual traction with red swiss ball 4 x 1 minute Supine ab set and PPT with LE extension 2 x 10 Supine 90 - 90 ab set with shoulder flexion 2 x 10  Supine 90 -90 ab set with LE extension 2 x 10  1/2 kneeling hip flexor stretch with PPT/glute set 3 x 20 second holds Tall kneeling  hip hinge 2 x 10  05/19/23 Manual: STM lower lumbar paraspinals, grade II - III CPA L3-L5 Press up 2 x 10 Standing lumbar extension 1 x 10 Standing lumbar ext at counter with hip dip 1 x 10  STS with RTB at knees 2 x 10 Palof press RTB 1 x 15  05/17/23 Manual: STM lower lumbar paraspinals, grade II - III CPA L3-L5 Standing lumbar extension 1 x 10 Palof press RTB 2 x 15 Resisted lift/chops RTB 2 x 10 each   [facility] Adult PT Treatment:                                                DATE: 05/13/23 Therapeutic Exercise: Supine cervical rotations x5 BIL cues for appropriate sensation and ROM Short lever open books x8 BIL  Standing cervical retraction w/ ball at wall 2x10 cues for reduced compensations  Manual Therapy: Supine; STM BIL paracervicals, suboccipitals. STM and trigger point release L LS with gentle passive pin and stretch Prone: STM BIL superior glutes and lumbar paraspinals  Neuromuscular re-ed: 4 inch runner step up x8 BIL cues for sequencing and posture 4 inch runner step up + OH press (unweighted) x8 BIL, 1# x8  BIL, cues for pacing, posture, and appropriate mechanics    [facility] Adult PT Treatment:                                                DATE: 05/10/23 Therapeutic Exercise: Short lever open books 2x5 BIL w/ breath control  SKTC hooklying 2x30sec BIL  DKTC 2x30sec   Neuromuscular re-ed: Bridge x12 Bridge + ball squeeze x10 Hooklying red band pull down 2x8 cues for core contraction  Manual Therapy: Prone; STM BIL lumbar paraspinals, QL, superior glute; R>L    05/03/23 Manual: STM lower lumbar paraspinals, grade II CPA L4-5, grade II R UPA L4-5 Sidelying hip abduction 2 x 10 Prone hip extension 2 x 10 DKTC with heels on green ball 10 x 5-10 second holds Supine ab iso with ball 10 x 5 second holds Standing Row RTB 2 x 12 Standing shoulder extension RTB 2 x 12 Palof press 2 x 12 STS with RTB at knees    PATIENT EDUCATION:  Education details: rationale for  interventions, HEP  Person educated: Patient Education method: Explanation, Demonstration, Tactile cues, Verbal cues Education comprehension: verbalized understanding, returned demonstration, verbal cues required, tactile cues required, and needs further education     HOME EXERCISE PROGRAM: Access Code: [email or web address] URL: [email or web address] Date: 04/21/2023 - Supine Posterior Pelvic Tilt  - 2-3 x daily - 1 sets - 8 reps - Supine March  - 2-3 x daily - 1 sets - 8 reps - Supine Cervical Rotation AROM on Pillow  - 2-3 x daily - 1 sets - 8 reps  04/26/23 - Standing Lumbar Extension  - 3 x daily - 7 x weekly - 1-2 sets - 10 reps - Seated Correct Posture  - 1 x daily - 7 x weekly - Supine Lower Trunk Rotation  - 1 x daily - 7 x weekly - 10 reps - 5-10 second hold - Supine Bridge  - 1 x daily - 7 x weekly - 2 sets - 10 reps  ASSESSMENT:  CLINICAL IMPRESSION: Patient with continued tender cervical spine today. Manual with improvement in symptoms following. Cervical stretches and retraction with improvement in symptoms. Patient will continue to benefit from physical therapy in order to improve function and reduce impairment.    Eval: Patient is a pleasant 58 y.o. woman who was seen today for physical therapy evaluation and treatment for neck/back pain. The former is chronic in nature and pt states is limiting, but has been stable for past few years. She states back pain is newer and more limiting, tends to affect majority of daily tasks/mobility and has culminated in reduced activity levels. On exam she demonstrates concordant limitations in cervical and lumbar mobility, reduced LE strength, and altered transfer mechanics. 5xSTS is indicative of fall risk and reduced functional mobility. Red flag screening reassuring today. Pt tolerates exam/HEP well overall without adverse event or increase in pain. Recommend trial of skilled PT to address aforementioned deficits with aim of improving  functional tolerance and reducing pain with typical activities. Pt departs today's session in no acute distress, all voiced concerns/questions addressed appropriately from PT perspective.    OBJECTIVE IMPAIRMENTS: decreased activity tolerance, decreased endurance, decreased mobility, difficulty walking, decreased ROM, decreased strength, impaired perceived functional ability, improper body mechanics, postural dysfunction, and pain.   ACTIVITY LIMITATIONS: carrying, lifting, bending, sitting, standing, squatting, sleeping, transfers, dressing, and  locomotion level  PARTICIPATION LIMITATIONS: meal prep, cleaning, laundry, driving, community activity, and occupation  PERSONAL FACTORS: Time since onset of injury/illness/exacerbation and 1-2 comorbidities: anxiety, asthma  are also affecting patient's functional outcome.   REHAB POTENTIAL: Good  CLINICAL DECISION MAKING: Evolving/moderate complexity  EVALUATION COMPLEXITY: Moderate   GOALS:  SHORT TERM GOALS: Target date: 05/19/2023  Pt will demonstrate appropriate understanding and performance of initially prescribed HEP in order to facilitate improved independence with management of symptoms.  Baseline: HEP established  Goal status: INITIAL   2. Pt will report at least 25% improvement in overall pain levels over past week in order to facilitate improved tolerance to typical daily activities.   Baseline: neck 1-5/10 ; back 3-9/10   Goal status: INITIAL    LONG TERM GOALS: Target date: 06/16/2023   Pt will score less than or equal to 15/50 on NDI in order to demonstrate improved perception of function due to symptoms (MDC 10-13 pts per Adline Hook al 2009, 2010). Baseline: 28/50 Goal status: INITIAL   2.  Pt will demonstrate full/painless lumbar sidebending/extension AROM in order to demonstrate improved tolerance to functional movement patterns.   Baseline: see ROM chart above Goal status: INITIAL  3.  Pt will demonstrate hip flex/abd  MMT of at least 4+/5 bilaterally in order to demonstrate improved strength for functional movements.  Baseline: see MMT chart above Goal status: INITIAL  4. Pt will perform 5xSTS in <12 sec in order to demonstrate reduced fall risk and improved functional independence. (MCID of 2.3sec)  Baseline: 20sec  Goal status: INITIAL   5. Pt will report at least 50% decrease in overall pain levels in past week in order to facilitate improved tolerance to basic ADLs/mobility.   Baseline: neck 1-5/10 ; back 3-9/10   Goal status: INITIAL    6. Pt will endorse ability to perform lower body dressing with less than 3 pt increase in pain in order to facilitate improved tolerance to ADLs.  Baseline: inc pain w/ LBD  Goal status: INITIAL  7. Pt will endorse ability to stand/walk for >73min with no more than 1 rest break in order to facilitate improved tolerance to household tasks.  Baseline: inc pain w/ standing > a couple minutes  Goal status: INITIAL   PLAN:  PT FREQUENCY: 1-2x/week  PT DURATION: 8 weeks  PLANNED INTERVENTIONS: [id number]- PT Re-evaluation, 97110-Therapeutic exercises, 97530- Therapeutic activity, 97112- Neuromuscular re-education, 97535- Self Care, 08657- Manual therapy, [phone]- Gait training, [phone]- Aquatic Therapy, [phone]- Electrical stimulation (unattended), Patient/Family education, Balance training, Stair training, Taping, Dry Needling, Joint mobilization, Spinal mobilization, Cryotherapy, and Moist heat.  PLAN FOR NEXT SESSION: Review/update HEP PRN. Work on [employer] exercises as appropriate with emphasis on comfortable mobility, core stability, and LE strengthening. Symptom modification strategies as indicated/appropriate.     [employer]acket Tandra Rosado, PT 06/02/2023, 8:51 AM

## 2023-06-09 ENCOUNTER — Ambulatory Visit (HOSPITAL_BASED_OUTPATIENT_CLINIC_OR_DEPARTMENT_OTHER): Payer: Self-pay | Attending: Neurosurgery | Admitting: Physical Therapy

## 2023-06-09 ENCOUNTER — Encounter (HOSPITAL_BASED_OUTPATIENT_CLINIC_OR_DEPARTMENT_OTHER): Payer: Self-pay | Admitting: Physical Therapy

## 2023-06-09 DIAGNOSIS — M5459 Other low back pain: Secondary | ICD-10-CM | POA: Diagnosis not present

## 2023-06-09 DIAGNOSIS — M542 Cervicalgia: Secondary | ICD-10-CM | POA: Insufficient documentation

## 2023-06-09 NOTE — Therapy (Signed)
 OUTPATIENT PHYSICAL THERAPY TREATMENT   Patient Name: Ashley Murphy MRN: [id number] DOB:Apr 06, 1965, 58 y.o., female Today's Date: 06/09/2023  END OF SESSION:  PT End of Session - 06/09/23 0935     Visit Number 11    Number of Visits 29    Date for PT Re-Evaluation 07/21/23    Authorization Type [employer]    PT Start Time 0935    PT Stop Time 1014    PT Time Calculation (min) 39 min    Activity Tolerance Patient tolerated treatment well    Behavior During Therapy WFL for tasks assessed/performed               Past Medical History:  Diagnosis Date   Anemia    Anxiety    Asthma    Family history of colon cancer    Family history of melanoma    Thyroid  disease    hypothyroid   Urinary incontinence    not severe   Past Surgical History:  Procedure Laterality Date   ANKLE ARTHROSCOPY WITH RECONSTRUCTION Right    CESAREAN SECTION     Patient Active Problem List   Diagnosis Date Noted   Acute non-recurrent pansinusitis 07/22/2022   Encounter for annual physical exam 07/08/2022   Elevated LDL cholesterol level 07/08/2022   Family history of melanoma 09/22/2020   Family history of colon cancer 09/22/2020   Granuloma annulare 06/10/2017   Melanoma of skin (HCC) 06/10/2017   Ankle fracture, right 07/28/2015   Primary hypothyroidism 07/28/2015   Restless legs syndrome (RLS) 07/28/2015   Extrinsic asthma 07/28/2015    PCP: Alda Amas, PA-C  REFERRING PROVIDER: Van Gelinas, MD  REFERRING DIAG: M54.2 (ICD-10-CM) - Cervicalgia  Rationale for Evaluation and Treatment: Rehabilitation  THERAPY DIAG:  Other low back pain  Cervicalgia  ONSET DATE: neck pain for a few years, back pain since August 2024  SUBJECTIVE:                                                                                                                                                                                           SUBJECTIVE STATEMENT: Patient states neck is  amazing, was sore after last time. Neck was good the next day and not as tight as it was. Headaches have been better also. Not as sore at the base of the head. Back is sore. Still waiting on MRI approval. HEP going well. 40% improvement in symptoms. Not feeling as guarded, not as many jolts of pain.     EVAL: Pt arrives w/ referral for cervicalgia although paper referral also mentions PT for low back.  Pt states  neck has been on ongoing issue for several years, feels relatively stable although it does limit her. States she has had PT in the past which improved mobility some, has continued with some of her exercises. Pt states her back is a newer issue and her more pressing concern. States it began last year, initially fluctuating. States towards the end of last year she was on a boat that had some heavy jolting while hitting wakes and seemed to consistently worsen afterwards. She is now having increased difficulty w/ majority of movement and prolonged positioning. She states most of her pain is around belt line R>L but will sometimes radiate superiorly or into hips. She notes that neck and back both tend to have better tolerance if she is in "just the right spot". States she feels guarded when she moves/walks, historically has been fairly active but has limited activity due to pain. Occasional pain/weakness in BIL LE, R>L. Occasional N/T as well. She denies any saddle anesthesia or sensory changes. Has known history of mild urinary incontinence but states this is stable and unchanging.    PERTINENT HISTORY:  anxiety, asthma, hx incontinence  PAIN:  Are you having pain: 3/10 back, neck 2/10   Per eval - Location/description: neck (upper neck, R>L) pulling and tight feeling ; R>L belt line, occasionally goes up and has random pains down legs Best-worst over past week: neck 1-5/10 ; back 3-9/10 - aggravating factors: sitting ; looking up ; bending ; lower body dressing ; standing > a couple minutes ;  walking inclines/declines - Easing factors: rest breaking, stretching    PRECAUTIONS: None   WEIGHT BEARING RESTRICTIONS: No  FALLS:  Has patient fallen in last 6 months? No  LIVING ENVIRONMENT: 2 story home, main level livable ; some difficulty with stair navigation (d/t ankle issues and back pain) has rails at home Lives w/ husband and son Housework split - pt states she usually does typical cleaning, cooking  OCCUPATION: works as Emergency planning/management officer, mostly in office, lots of sitting   PLOF: Independent  PATIENT GOALS: feel better, particularly with her back   NEXT MD VISIT: mid April   OBJECTIVE:  Note: Objective measures were completed at Evaluation unless otherwise noted.  DIAGNOSTIC FINDINGS:  03/08/23 Lumbar XR "IMPRESSION: 1. Degenerative disc disease at L3-L4. 2. Trace retrolisthesis of L2 on L3 and L3 on L4."  PATIENT SURVEYS:  NDI: 28/50 ; 56%  06/09/23: 17/50  COGNITION: Overall cognitive status: Within functional limits for tasks assessed     SENSATION/NEURO: Light touch intact BIL LE No clonus either LE Negative hoffmann and tromner sign BIL No ataxia with gait   POSTURE: increased lumbar lordosis, fwd head posture, R UT more elevated than L and mild lateral shift towards L   CERVICAL ROM:    A/PROM  eval AROM 05/13/23 AROM 06/09/23  Flexion 100%  0% limited  Extension 75% s  25% limited *  Right lateral flexion 50% R sided pain  50% limited  Left lateral flexion 50% R sided pain  50% limited   Right rotation 43 deg 58 deg 63 deg  Left rotation 55 deg 53 deg 61 deg   (Blank rows = not tested) (Key: WFL = within functional limits not formally assessed, * = concordant pain, s = stiffness/stretching sensation, NT = not tested)  Comments:    LUMBAR ROM:   AROM eval 06/09/23  Flexion Able to touch feet with relieving R sided stretch  0% limited pain with return to standing  Extension 50% *  25% limited*  Right lateral flexion Just above knee,  painless  25% limited  Left lateral flexion Mid thigh R sided pain  25% limited * (pain R glutes)  Right rotation 100% soreness  0% limited  Left rotation 100% soreness  0% limited   (Blank rows = not tested) (Key: WFL = within functional limits not formally assessed, * = concordant pain, s = stiffness/stretching sensation, NT = not tested) Comments: flexion is relieving in clinic today although pt notes oftentimes returning to neutral from flexion is painful  RANGE OF MOTION:      Right eval Left eval  Shoulder flexion    Functional ER combo    Functional IR combo    Knee extension    Ankle dorsiflexion     (Blank rows = not tested) (Key: WFL = within functional limits not formally assessed, * = concordant pain, s = stiffness/stretching sensation, NT = not tested)  Comments: full shoulder flex/abd ROM without pain   STRENGTH TESTING:  MMT Right eval Left eval Right 06/09/23 Left 06/09/23  Shoulder flexion 5 5 5 5   Shoulder abduction 5 5 5 5   Shoulder extension 5 5 5 5   Elbow flexion      Elbow extension      Grip strength (gross)      Hip flexion 4 4 * 4 4  Hip abduction (modified sitting) 4 4 4+ 4+  Knee flexion 4 4 4+ 4+  Knee extension 4+ 4+ 5 5  Ankle dorsiflexion 4+ 4+ 5 5  Ankle plantarflexion       (Blank rows = not tested) (Key: WFL = within functional limits not formally assessed, * = concordant pain, s = stiffness/stretching sensation, NT = not tested)  Comments:     FUNCTIONAL TESTS:  5xSTS: 20.08sec no UE support; rigid trunk mechanics with reduced fwd weightshifting 5xSTS: 7.99 seconds with no UE support, slight valgus bilaterally  GAIT: Distance walked: within clinic Assistive device utilized: None Level of assistance: Complete Independence Comments: reduced truncal rotation and arm swing, mildly reduced cadence  TREATMENT DATE:  06/09/23 Reassessment   06/02/23 Manual: STM to suboccipitals, levator scapulae, UT Seated levator scapulae  stretch 3 x 20 second holds Seated UT stretch 3 x 20 second holds Supine cervical retraction 2 x 10   05/24/23 Manual: STM lower lumbar paraspinals, grade II - III CPA L3-L5, supine manual traction with red swiss ball 4 x 1 minute Supine ab set and PPT with LE extension 2 x 10 Supine 90 - 90 ab set with shoulder flexion 2 x 10  Supine 90 -90 ab set with LE extension 2 x 10  1/2 kneeling hip flexor stretch with PPT/glute set 3 x 20 second holds Tall kneeling hip hinge 2 x 10  05/19/23 Manual: STM lower lumbar paraspinals, grade II - III CPA L3-L5 Press up 2 x 10 Standing lumbar extension 1 x 10 Standing lumbar ext at counter with hip dip 1 x 10  STS with RTB at knees 2 x 10 Palof press RTB 1 x 15  05/17/23 Manual: STM lower lumbar paraspinals, grade II - III CPA L3-L5 Standing lumbar extension 1 x 10 Palof press RTB 2 x 15 Resisted lift/chops RTB 2 x 10 each   [facility] Adult PT Treatment:  DATE: 05/13/23 Therapeutic Exercise: Supine cervical rotations x5 BIL cues for appropriate sensation and ROM Short lever open books x8 BIL  Standing cervical retraction w/ ball at wall 2x10 cues for reduced compensations  Manual Therapy: Supine; STM BIL paracervicals, suboccipitals. STM and trigger point release L LS with gentle passive pin and stretch Prone: STM BIL superior glutes and lumbar paraspinals  Neuromuscular re-ed: 4 inch runner step up x8 BIL cues for sequencing and posture 4 inch runner step up + OH press (unweighted) x8 BIL, 1# x8 BIL, cues for pacing, posture, and appropriate mechanics    [facility] Adult PT Treatment:                                                DATE: 05/10/23 Therapeutic Exercise: Short lever open books 2x5 BIL w/ breath control  SKTC hooklying 2x30sec BIL  DKTC 2x30sec   Neuromuscular re-ed: Bridge x12 Bridge + ball squeeze x10 Hooklying red band pull down 2x8 cues for core contraction  Manual Therapy: Prone; STM  BIL lumbar paraspinals, QL, superior glute; R>L    05/03/23 Manual: STM lower lumbar paraspinals, grade II CPA L4-5, grade II R UPA L4-5 Sidelying hip abduction 2 x 10 Prone hip extension 2 x 10 DKTC with heels on green ball 10 x 5-10 second holds Supine ab iso with ball 10 x 5 second holds Standing Row RTB 2 x 12 Standing shoulder extension RTB 2 x 12 Palof press 2 x 12 STS with RTB at knees    PATIENT EDUCATION:  Education details: rationale for interventions, HEP  Person educated: Patient Education method: Explanation, Demonstration, Tactile cues, Verbal cues Education comprehension: verbalized understanding, returned demonstration, verbal cues required, tactile cues required, and needs further education     HOME EXERCISE PROGRAM: Access Code: [email or web address] URL: [email or web address] Date: 04/21/2023 - Supine Posterior Pelvic Tilt  - 2-3 x daily - 1 sets - 8 reps - Supine March  - 2-3 x daily - 1 sets - 8 reps - Supine Cervical Rotation AROM on Pillow  - 2-3 x daily - 1 sets - 8 reps  04/26/23 - Standing Lumbar Extension  - 3 x daily - 7 x weekly - 1-2 sets - 10 reps - Seated Correct Posture  - 1 x daily - 7 x weekly - Supine Lower Trunk Rotation  - 1 x daily - 7 x weekly - 10 reps - 5-10 second hold - Supine Bridge  - 1 x daily - 7 x weekly - 2 sets - 10 reps  ASSESSMENT:  CLINICAL IMPRESSION: Patient has met 2/2 short term goals and 1/7 long term goals with ability to complete HEP and improvement in symptoms,  and functional mobility.  Remaining goals not met due to continued deficits in symptoms, strength, ROM, activity tolerance, and functional mobility.. Patient has made good progress toward remaining goals. Overall, improving in mobility and strength but limited by lumbar symptoms mostly with flexion positioning and activity tolerance limitations. Extending POC 1-2x/week for 6 weeks. Patient will continue to benefit from skilled physical therapy in order to  improve function and reduce impairment.      Eval: Patient is a pleasant 58 y.o. woman who was seen today for physical therapy evaluation and treatment for neck/back pain. The former is chronic in nature and pt states is limiting, but has been stable for past few  years. She states back pain is newer and more limiting, tends to affect majority of daily tasks/mobility and has culminated in reduced activity levels. On exam she demonstrates concordant limitations in cervical and lumbar mobility, reduced LE strength, and altered transfer mechanics. 5xSTS is indicative of fall risk and reduced functional mobility. Red flag screening reassuring today. Pt tolerates exam/HEP well overall without adverse event or increase in pain. Recommend trial of skilled PT to address aforementioned deficits with aim of improving functional tolerance and reducing pain with typical activities. Pt departs today's session in no acute distress, all voiced concerns/questions addressed appropriately from PT perspective.    OBJECTIVE IMPAIRMENTS: decreased activity tolerance, decreased endurance, decreased mobility, difficulty walking, decreased ROM, decreased strength, impaired perceived functional ability, improper body mechanics, postural dysfunction, and pain.   ACTIVITY LIMITATIONS: carrying, lifting, bending, sitting, standing, squatting, sleeping, transfers, dressing, and locomotion level  PARTICIPATION LIMITATIONS: meal prep, cleaning, laundry, driving, community activity, and occupation  PERSONAL FACTORS: Time since onset of injury/illness/exacerbation and 1-2 comorbidities: anxiety, asthma  are also affecting patient's functional outcome.   REHAB POTENTIAL: Good  CLINICAL DECISION MAKING: Evolving/moderate complexity  EVALUATION COMPLEXITY: Moderate   GOALS:  SHORT TERM GOALS: Target date: 05/19/2023  Pt will demonstrate appropriate understanding and performance of initially prescribed HEP in order to facilitate  improved independence with management of symptoms.  Baseline: HEP established  Goal status: MET   2. Pt will report at least 25% improvement in overall pain levels over past week in order to facilitate improved tolerance to typical daily activities.   Baseline: neck 1-5/10 ; back 3-9/10 06/09/23: 40% improvement   Goal status: MET  LONG TERM GOALS: Target date: 06/16/2023   Pt will score less than or equal to 15/50 on NDI in order to demonstrate improved perception of function due to symptoms (MDC 10-13 pts per Adline Hook al 2009, 2010). Baseline: 28/50 06/09/23 17/50  Goal status: INITIAL   2.  Pt will demonstrate full/painless lumbar sidebending/extension AROM in order to demonstrate improved tolerance to functional movement patterns.   Baseline: see ROM chart above Goal status: INITIAL  3.  Pt will demonstrate hip flex/abd MMT of at least 4+/5 bilaterally in order to demonstrate improved strength for functional movements.  Baseline: see MMT chart above Goal status: INITIAL  4. Pt will perform 5xSTS in <12 sec in order to demonstrate reduced fall risk and improved functional independence. (MCID of 2.3sec)  Baseline: 20sec  06/09/23 5xSTS: 7.99 seconds with no UE support, slight valgus bilaterally  Goal status: MET  5. Pt will report at least 50% decrease in overall pain levels in past week in order to facilitate improved tolerance to basic ADLs/mobility.   Baseline: neck 1-5/10 ; back 3-9/10 06/09/23: 40%  Goal status: INITIAL    6. Pt will endorse ability to perform lower body dressing with less than 3 pt increase in pain in order to facilitate improved tolerance to ADLs.  Baseline: inc pain w/ LBP 06/09/23: Still a problem getting back up  Goal status: INITIAL  7. Pt will endorse ability to stand/walk for >66min with no more than 1 rest break in order to facilitate improved tolerance to household tasks.  Baseline: inc pain w/ standing > a couple minutes 06/09/23: standing max 5-10 minutes,  walking 10-15 minutes  Goal status: INITIAL   PLAN:  PT FREQUENCY: 1-2x/week  PT DURATION: 6 weeks  PLANNED INTERVENTIONS: 97164- PT Re-evaluation, 97110-Therapeutic exercises, 97530- Therapeutic activity, 97112- Neuromuscular re-education, 97535- Self Care, 16109- Manual  therapy, [id number]- Gait training, 16109- Aquatic Therapy, [id number]- Electrical stimulation (unattended), Patient/Family education, Balance training, Stair training, Taping, Dry Needling, Joint mobilization, Spinal mobilization, Cryotherapy, and Moist heat.  PLAN FOR NEXT SESSION: Review/update HEP PRN. Work on [employer] exercises as appropriate with emphasis on comfortable mobility, core stability, and LE strengthening. Symptom modification strategies as indicated/appropriate.     Perfecto Bracket Leelyn Jasinski, PT 06/09/2023, 10:14 AM

## 2023-06-17 ENCOUNTER — Encounter (HOSPITAL_BASED_OUTPATIENT_CLINIC_OR_DEPARTMENT_OTHER): Payer: Self-pay | Admitting: Physical Therapy

## 2023-06-17 ENCOUNTER — Ambulatory Visit (HOSPITAL_BASED_OUTPATIENT_CLINIC_OR_DEPARTMENT_OTHER): Admitting: Physical Therapy

## 2023-06-17 DIAGNOSIS — M5459 Other low back pain: Secondary | ICD-10-CM

## 2023-06-17 DIAGNOSIS — M542 Cervicalgia: Secondary | ICD-10-CM | POA: Diagnosis not present

## 2023-06-17 NOTE — Therapy (Signed)
 OUTPATIENT PHYSICAL THERAPY TREATMENT   Patient Name: Ashley Murphy MRN: [id number] DOB:[birth year], 58 y.o., female Today's Date: 06/17/2023  END OF SESSION:  PT End of Session - 06/17/23 0853     Visit Number 12    Number of Visits 29    Date for PT Re-Evaluation 07/21/23    Authorization Type BC[employer]    PT Start Time [id number]    PT Stop Time 0920    PT Time Calculation (min) 28 min    Activity Tolerance Patient tolerated treatment well    Behavior During Therapy [facility] for tasks assessed/performed               Past Medical History:  Diagnosis Date   Anemia    Anxiety    Asthma    Family history of colon cancer    Family history of melanoma    Thyroid  disease    hypothyroid   Urinary incontinence    not severe   Past Surgical History:  Procedure Laterality Date   ANKLE ARTHROSCOPY WITH RECONSTRUCTION Right    CESAREAN SECTION     Patient Active Problem List   Diagnosis Date Noted   Acute non-recurrent pansinusitis 07/22/2022   Encounter for annual physical exam 07/08/2022   Elevated LDL cholesterol level 07/08/2022   Family history of melanoma 09/22/2020   Family history of colon cancer 09/22/2020   Granuloma annulare 06/10/2017   Melanoma of skin (HCC) 06/10/2017   Ankle fracture, right 07/28/2015   Primary hypothyroidism 07/28/2015   Restless legs syndrome (RLS) 07/28/2015   Extrinsic asthma 07/28/2015    PCP: Alda Amas, PA-C  REFERRING PROVIDER: Van Gelinas, MD  REFERRING DIAG: M54.2 (ICD-10-CM) - Cervicalgia  Rationale for Evaluation and Treatment: Rehabilitation  THERAPY DIAG:  Other low back pain  Cervicalgia  ONSET DATE: neck pain for a few years, back pain since August 2024  SUBJECTIVE:                                                                                                                                                                                           SUBJECTIVE STATEMENT: Patient states got a new  chair at work which has been helpful. Neck is feeling sore, better since working on it but tightening back up. Trying to move back more. Not always jolt of pain when first getting up. Pushing on back in PT is helpful.     EVAL: Pt arrives w/ referral for cervicalgia although paper referral also mentions PT for low back.  Pt states neck has been on ongoing issue for several years, feels relatively stable although it does limit  her. States she has had PT in the past which improved mobility some, has continued with some of her exercises. Pt states her back is a newer issue and her more pressing concern. States it began last year, initially fluctuating. States towards the end of last year she was on a boat that had some heavy jolting while hitting wakes and seemed to consistently worsen afterwards. She is now having increased difficulty w/ majority of movement and prolonged positioning. She states most of her pain is around belt line R>L but will sometimes radiate superiorly or into hips. She notes that neck and back both tend to have better tolerance if she is in "just the right spot". States she feels guarded when she moves/walks, historically has been fairly active but has limited activity due to pain. Occasional pain/weakness in BIL LE, R>L. Occasional N/T as well. She denies any saddle anesthesia or sensory changes. Has known history of mild urinary incontinence but states this is stable and unchanging.    PERTINENT HISTORY:  anxiety, asthma, hx incontinence  PAIN:  Are you having pain: 3/10 back, neck 2/10   Per eval - Location/description: neck (upper neck, R>L) pulling and tight feeling ; R>L belt line, occasionally goes up and has random pains down legs Best-worst over past week: neck 1-5/10 ; back 3-9/10 - aggravating factors: sitting ; looking up ; bending ; lower body dressing ; standing > a couple minutes ; walking inclines/declines - Easing factors: rest breaking, stretching     PRECAUTIONS: None   WEIGHT BEARING RESTRICTIONS: No  FALLS:  Has patient fallen in last 6 months? No  LIVING ENVIRONMENT: 2 story home, main level livable ; some difficulty with stair navigation (d/t ankle issues and back pain) has rails at home Lives w/ husband and son Housework split - pt states she usually does typical cleaning, cooking  OCCUPATION: works as Emergency planning/management officer, mostly in office, lots of sitting   PLOF: Independent  PATIENT GOALS: feel better, particularly with her back   NEXT MD VISIT: mid April   OBJECTIVE:  Note: Objective measures were completed at Evaluation unless otherwise noted.  DIAGNOSTIC FINDINGS:  03/08/23 Lumbar XR "IMPRESSION: 1. Degenerative disc disease at L3-L4. 2. Trace retrolisthesis of L2 on L3 and L3 on L4."  PATIENT SURVEYS:  NDI: 28/50 ; 56%  06/09/23: 17/50  COGNITION: Overall cognitive status: Within functional limits for tasks assessed     SENSATION/NEURO: Light touch intact BIL LE No clonus either LE Negative hoffmann and tromner sign BIL No ataxia with gait   POSTURE: increased lumbar lordosis, fwd head posture, R UT more elevated than L and mild lateral shift towards L   CERVICAL ROM:    A/PROM  eval AROM 05/13/23 AROM 06/09/23  Flexion 100%  0% limited  Extension 75% s  25% limited *  Right lateral flexion 50% R sided pain  50% limited  Left lateral flexion 50% R sided pain  50% limited   Right rotation 43 deg 58 deg 63 deg  Left rotation 55 deg 53 deg 61 deg   (Blank rows = not tested) (Key: WFL = within functional limits not formally assessed, * = concordant pain, s = stiffness/stretching sensation, NT = not tested)  Comments:    LUMBAR ROM:   AROM eval 06/09/23  Flexion Able to touch feet with relieving R sided stretch  0% limited pain with return to standing  Extension 50% *  25% limited*  Right lateral flexion Just above knee, painless  25% limited  Left lateral flexion Mid thigh R sided pain  25%  limited * (pain R glutes)  Right rotation 100% soreness  0% limited  Left rotation 100% soreness  0% limited   (Blank rows = not tested) (Key: WFL = within functional limits not formally assessed, * = concordant pain, s = stiffness/stretching sensation, NT = not tested) Comments: flexion is relieving in clinic today although pt notes oftentimes returning to neutral from flexion is painful  RANGE OF MOTION:      Right eval Left eval  Shoulder flexion    Functional ER combo    Functional IR combo    Knee extension    Ankle dorsiflexion     (Blank rows = not tested) (Key: WFL = within functional limits not formally assessed, * = concordant pain, s = stiffness/stretching sensation, NT = not tested)  Comments: full shoulder flex/abd ROM without pain   STRENGTH TESTING:  MMT Right eval Left eval Right 06/09/23 Left 06/09/23  Shoulder flexion 5 5 5 5   Shoulder abduction 5 5 5 5   Shoulder extension 5 5 5 5   Elbow flexion      Elbow extension      Grip strength (gross)      Hip flexion 4 4 * 4 4  Hip abduction (modified sitting) 4 4 4+ 4+  Knee flexion 4 4 4+ 4+  Knee extension 4+ 4+ 5 5  Ankle dorsiflexion 4+ 4+ 5 5  Ankle plantarflexion       (Blank rows = not tested) (Key: WFL = within functional limits not formally assessed, * = concordant pain, s = stiffness/stretching sensation, NT = not tested)  Comments:     FUNCTIONAL TESTS:  5xSTS: 20.08sec no UE support; rigid trunk mechanics with reduced fwd weightshifting 5xSTS: 7.99 seconds with no UE support, slight valgus bilaterally  GAIT: Distance walked: within clinic Assistive device utilized: None Level of assistance: Complete Independence Comments: reduced truncal rotation and arm swing, mildly reduced cadence  TREATMENT DATE:  06/17/23 Manual: STM lower lumbar paraspinals, grade II - III CPA L3-L5 Manual: STM to lumbar paraspinals pre and post dry needling for trigger point identification and muscular  relaxation. Trigger Point Dry Needling  Subsequent Treatment: Instructions provided previously at initial dry needling treatment.  Instructions reviewed, if requested by the patient, prior to subsequent dry needling treatment.   Patient Verbal Consent Given: Yes Education Handout Provided: Previously Provided Muscles Treated: R lumbar paraspinals Electrical Stimulation Performed: No Treatment Response/Outcome: decrease in tissue tension   Seated lat/paraspinal stretch 2 x 20 second holds  06/09/23 Reassessment   06/02/23 Manual: STM to suboccipitals, levator scapulae, UT Seated levator scapulae stretch 3 x 20 second holds Seated UT stretch 3 x 20 second holds Supine cervical retraction 2 x 10   05/24/23 Manual: STM lower lumbar paraspinals, grade II - III CPA L3-L5, supine manual traction with red swiss ball 4 x 1 minute Supine ab set and PPT with LE extension 2 x 10 Supine 90 - 90 ab set with shoulder flexion 2 x 10  Supine 90 -90 ab set with LE extension 2 x 10  1/2 kneeling hip flexor stretch with PPT/glute set 3 x 20 second holds Tall kneeling hip hinge 2 x 10  05/19/23 Manual: STM lower lumbar paraspinals, grade II - III CPA L3-L5 Press up 2 x 10 Standing lumbar extension 1 x 10 Standing lumbar ext at counter with hip dip 1 x 10  STS with RTB at  knees 2 x 10 Palof press RTB 1 x 15  05/17/23 Manual: STM lower lumbar paraspinals, grade II - III CPA L3-L5 Standing lumbar extension 1 x 10 Palof press RTB 2 x 15 Resisted lift/chops RTB 2 x 10 each   [facility] Adult PT Treatment:                                                DATE: 05/13/23 Therapeutic Exercise: Supine cervical rotations x5 BIL cues for appropriate sensation and ROM Short lever open books x8 BIL  Standing cervical retraction w/ ball at wall 2x10 cues for reduced compensations  Manual Therapy: Supine; STM BIL paracervicals, suboccipitals. STM and trigger point release L LS with gentle passive pin and  stretch Prone: STM BIL superior glutes and lumbar paraspinals  Neuromuscular re-ed: 4 inch runner step up x8 BIL cues for sequencing and posture 4 inch runner step up + OH press (unweighted) x8 BIL, 1# x8 BIL, cues for pacing, posture, and appropriate mechanics    [facility] Adult PT Treatment:                                                DATE: 05/10/23 Therapeutic Exercise: Short lever open books 2x5 BIL w/ breath control  SKTC hooklying 2x30sec BIL  DKTC 2x30sec   Neuromuscular re-ed: Bridge x12 Bridge + ball squeeze x10 Hooklying red band pull down 2x8 cues for core contraction  Manual Therapy: Prone; STM BIL lumbar paraspinals, QL, superior glute; R>L    05/03/23 Manual: STM lower lumbar paraspinals, grade II CPA L4-5, grade II R UPA L4-5 Sidelying hip abduction 2 x 10 Prone hip extension 2 x 10 DKTC with heels on green ball 10 x 5-10 second holds Supine ab iso with ball 10 x 5 second holds Standing Row RTB 2 x 12 Standing shoulder extension RTB 2 x 12 Palof press 2 x 12 STS with RTB at knees    PATIENT EDUCATION:  Education details: rationale for interventions, HEP  Person educated: Patient Education method: Explanation, Demonstration, Tactile cues, Verbal cues Education comprehension: verbalized understanding, returned demonstration, verbal cues required, tactile cues required, and needs further education     HOME EXERCISE PROGRAM: Access Code: [email or web address] URL: [email or web address] Date: 04/21/2023 - Supine Posterior Pelvic Tilt  - 2-3 x daily - 1 sets - 8 reps - Supine March  - 2-3 x daily - 1 sets - 8 reps - Supine Cervical Rotation AROM on Pillow  - 2-3 x daily - 1 sets - 8 reps  04/26/23 - Standing Lumbar Extension  - 3 x daily - 7 x weekly - 1-2 sets - 10 reps - Seated Correct Posture  - 1 x daily - 7 x weekly - Supine Lower Trunk Rotation  - 1 x daily - 7 x weekly - 10 reps - 5-10 second hold - Supine Bridge  - 1 x daily - 7 x weekly - 2 sets -  10 reps  ASSESSMENT:  CLINICAL IMPRESSION: Patient needing to leave session early today. Patient with continued low back symptoms. Manual with decrease in tissue tension. No change in symptoms immediately with DN. Performed lat/paraspinal stretch which targeted area well. Patient will continue to benefit from skilled physical therapy  in order to improve function and reduce impairment.      Eval: Patient is a pleasant 58 y.o. woman who was seen today for physical therapy evaluation and treatment for neck/back pain. The former is chronic in nature and pt states is limiting, but has been stable for past few years. She states back pain is newer and more limiting, tends to affect majority of daily tasks/mobility and has culminated in reduced activity levels. On exam she demonstrates concordant limitations in cervical and lumbar mobility, reduced LE strength, and altered transfer mechanics. 5xSTS is indicative of fall risk and reduced functional mobility. Red flag screening reassuring today. Pt tolerates exam/HEP well overall without adverse event or increase in pain. Recommend trial of skilled PT to address aforementioned deficits with aim of improving functional tolerance and reducing pain with typical activities. Pt departs today's session in no acute distress, all voiced concerns/questions addressed appropriately from PT perspective.    OBJECTIVE IMPAIRMENTS: decreased activity tolerance, decreased endurance, decreased mobility, difficulty walking, decreased ROM, decreased strength, impaired perceived functional ability, improper body mechanics, postural dysfunction, and pain.   ACTIVITY LIMITATIONS: carrying, lifting, bending, sitting, standing, squatting, sleeping, transfers, dressing, and locomotion level  PARTICIPATION LIMITATIONS: meal prep, cleaning, laundry, driving, community activity, and occupation  PERSONAL FACTORS: Time since onset of injury/illness/exacerbation and 1-2 comorbidities:  anxiety, asthma are also affecting patient's functional outcome.   REHAB POTENTIAL: Good  CLINICAL DECISION MAKING: Evolving/moderate complexity  EVALUATION COMPLEXITY: Moderate   GOALS:  SHORT TERM GOALS: Target date: 05/19/2023  Pt will demonstrate appropriate understanding and performance of initially prescribed HEP in order to facilitate improved independence with management of symptoms.  Baseline: HEP established  Goal status: MET   2. Pt will report at least 25% improvement in overall pain levels over past week in order to facilitate improved tolerance to typical daily activities.   Baseline: neck 1-5/10 ; back 3-9/10 06/09/23: 40% improvement   Goal status: MET  LONG TERM GOALS: Target date: 06/16/2023   Pt will score less than or equal to 15/50 on NDI in order to demonstrate improved perception of function due to symptoms (MDC 10-13 pts per Adline Hook al 2009, 2010). Baseline: 28/50 06/09/23 17/50  Goal status: INITIAL   2.  Pt will demonstrate full/painless lumbar sidebending/extension AROM in order to demonstrate improved tolerance to functional movement patterns.   Baseline: see ROM chart above Goal status: INITIAL  3.  Pt will demonstrate hip flex/abd MMT of at least 4+/5 bilaterally in order to demonstrate improved strength for functional movements.  Baseline: see MMT chart above Goal status: INITIAL  4. Pt will perform 5xSTS in <12 sec in order to demonstrate reduced fall risk and improved functional independence. (MCID of 2.3sec)  Baseline: 20sec  06/09/23 5xSTS: 7.99 seconds with no UE support, slight valgus bilaterally  Goal status: MET  5. Pt will report at least 50% decrease in overall pain levels in past week in order to facilitate improved tolerance to basic ADLs/mobility.   Baseline: neck 1-5/10 ; back 3-9/10 06/09/23: 40%  Goal status: INITIAL    6. Pt will endorse ability to perform lower body dressing with less than 3 pt increase in pain in order to facilitate  improved tolerance to ADLs.  Baseline: inc pain w/ LBP 06/09/23: Still a problem getting back up  Goal status: INITIAL  7. Pt will endorse ability to stand/walk for >32min with no more than 1 rest break in order to facilitate improved tolerance to household tasks.  Baseline:  inc pain w/ standing > a couple minutes 06/09/23: standing max 5-10 minutes, walking 10-15 minutes  Goal status: INITIAL   PLAN:  PT FREQUENCY: 1-2x/week  PT DURATION: 6 weeks  PLANNED INTERVENTIONS: 97164- PT Re-evaluation, 97110-Therapeutic exercises, 97530- Therapeutic activity, 97112- Neuromuscular re-education, 97535- Self Care, [id number]- Manual therapy, [phone]- Gait training, [phone]- Aquatic Therapy, [phone]- Electrical stimulation (unattended), Patient/Family education, Balance training, Stair training, Taping, Dry Needling, Joint mobilization, Spinal mobilization, Cryotherapy, and Moist heat.  PLAN FOR NEXT SESSION: Review/update HEP PRN. Work on [employer] exercises as appropriate with emphasis on comfortable mobility, core stability, and LE strengthening. Symptom modification strategies as indicated/appropriate.     [employer] Raaga Maeder, PT 06/17/2023, 8:54 AM

## 2023-06-23 ENCOUNTER — Encounter (HOSPITAL_BASED_OUTPATIENT_CLINIC_OR_DEPARTMENT_OTHER): Payer: Self-pay | Admitting: Physical Therapy

## 2023-06-23 ENCOUNTER — Ambulatory Visit (HOSPITAL_BASED_OUTPATIENT_CLINIC_OR_DEPARTMENT_OTHER): Admitting: Physical Therapy

## 2023-06-23 DIAGNOSIS — M542 Cervicalgia: Secondary | ICD-10-CM

## 2023-06-23 DIAGNOSIS — M5459 Other low back pain: Secondary | ICD-10-CM

## 2023-06-23 NOTE — Therapy (Signed)
 OUTPATIENT PHYSICAL THERAPY TREATMENT   Patient Name: Ashley Murphy MRN: [id number] DOB:08-02-1965, 58 y.o., female Today's Date: 06/23/2023  END OF SESSION:  PT End of Session - 06/23/23 1527     Visit Number 13    Number of Visits 29    Date for PT Re-Evaluation 07/21/23    Authorization Type [employer]    PT Start Time 1525   arrives late   PT Stop Time 1558    PT Time Calculation (min) 33 min    Activity Tolerance Patient tolerated treatment well    Behavior During Therapy WFL for tasks assessed/performed               Past Medical History:  Diagnosis Date   Anemia    Anxiety    Asthma    Family history of colon cancer    Family history of melanoma    Thyroid  disease    hypothyroid   Urinary incontinence    not severe   Past Surgical History:  Procedure Laterality Date   ANKLE ARTHROSCOPY WITH RECONSTRUCTION Right    CESAREAN SECTION     Patient Active Problem List   Diagnosis Date Noted   Acute non-recurrent pansinusitis 07/22/2022   Encounter for annual physical exam 07/08/2022   Elevated LDL cholesterol level 07/08/2022   Family history of melanoma 09/22/2020   Family history of colon cancer 09/22/2020   Granuloma annulare 06/10/2017   Melanoma of skin (HCC) 06/10/2017   Ankle fracture, right 07/28/2015   Primary hypothyroidism 07/28/2015   Restless legs syndrome (RLS) 07/28/2015   Extrinsic asthma 07/28/2015    PCP: Alda Amas, PA-C  REFERRING PROVIDER: Van Gelinas, MD  REFERRING DIAG: M54.2 (ICD-10-CM) - Cervicalgia  Rationale for Evaluation and Treatment: Rehabilitation  THERAPY DIAG:  Other low back pain  Cervicalgia  ONSET DATE: neck pain for a few years, back pain since August 2024  SUBJECTIVE:                                                                                                                                                                                           SUBJECTIVE STATEMENT: Patient  states back is really bothering her today. DN may have helped a little.     EVAL: Pt arrives w/ referral for cervicalgia although paper referral also mentions PT for low back.  Pt states neck has been on ongoing issue for several years, feels relatively stable although it does limit her. States she has had PT in the past which improved mobility some, has continued with some of her exercises. Pt states her back is a newer issue and  her more pressing concern. States it began last year, initially fluctuating. States towards the end of last year she was on a boat that had some heavy jolting while hitting wakes and seemed to consistently worsen afterwards. She is now having increased difficulty w/ majority of movement and prolonged positioning. She states most of her pain is around belt line R>L but will sometimes radiate superiorly or into hips. She notes that neck and back both tend to have better tolerance if she is in "just the right spot". States she feels guarded when she moves/walks, historically has been fairly active but has limited activity due to pain. Occasional pain/weakness in BIL LE, R>L. Occasional N/T as well. She denies any saddle anesthesia or sensory changes. Has known history of mild urinary incontinence but states this is stable and unchanging.    PERTINENT HISTORY:  anxiety, asthma, hx incontinence  PAIN:  Are you having pain: 5/10 back, neck 2/10   Per eval - Location/description: neck (upper neck, R>L) pulling and tight feeling ; R>L belt line, occasionally goes up and has random pains down legs Best-worst over past week: neck 1-5/10 ; back 3-9/10 - aggravating factors: sitting ; looking up ; bending ; lower body dressing ; standing > a couple minutes ; walking inclines/declines - Easing factors: rest breaking, stretching    PRECAUTIONS: None   WEIGHT BEARING RESTRICTIONS: No  FALLS:  Has patient fallen in last 6 months? No  LIVING ENVIRONMENT: 2 story home, main  level livable ; some difficulty with stair navigation (d/t ankle issues and back pain) has rails at home Lives w/ husband and son Housework split - pt states she usually does typical cleaning, cooking  OCCUPATION: works as Emergency planning/management officer, mostly in office, lots of sitting   PLOF: Independent  PATIENT GOALS: feel better, particularly with her back   NEXT MD VISIT: mid April   OBJECTIVE:  Note: Objective measures were completed at Evaluation unless otherwise noted.  DIAGNOSTIC FINDINGS:  03/08/23 Lumbar XR "IMPRESSION: 1. Degenerative disc disease at L3-L4. 2. Trace retrolisthesis of L2 on L3 and L3 on L4."  PATIENT SURVEYS:  NDI: 28/50 ; 56%  06/09/23: 17/50  COGNITION: Overall cognitive status: Within functional limits for tasks assessed     SENSATION/NEURO: Light touch intact BIL LE No clonus either LE Negative hoffmann and tromner sign BIL No ataxia with gait   POSTURE: increased lumbar lordosis, fwd head posture, R UT more elevated than L and mild lateral shift towards L   CERVICAL ROM:    A/PROM  eval AROM 05/13/23 AROM 06/09/23  Flexion 100%  0% limited  Extension 75% s  25% limited *  Right lateral flexion 50% R sided pain  50% limited  Left lateral flexion 50% R sided pain  50% limited   Right rotation 43 deg 58 deg 63 deg  Left rotation 55 deg 53 deg 61 deg   (Blank rows = not tested) (Key: WFL = within functional limits not formally assessed, * = concordant pain, s = stiffness/stretching sensation, NT = not tested)  Comments:    LUMBAR ROM:   AROM eval 06/09/23  Flexion Able to touch feet with relieving R sided stretch  0% limited pain with return to standing  Extension 50% *  25% limited*  Right lateral flexion Just above knee, painless  25% limited  Left lateral flexion Mid thigh R sided pain  25% limited * (pain R glutes)  Right rotation 100% soreness  0% limited  Left rotation  100% soreness  0% limited   (Blank rows = not tested) (Key: WFL =  within functional limits not formally assessed, * = concordant pain, s = stiffness/stretching sensation, NT = not tested) Comments: flexion is relieving in clinic today although pt notes oftentimes returning to neutral from flexion is painful  RANGE OF MOTION:      Right eval Left eval  Shoulder flexion    Functional ER combo    Functional IR combo    Knee extension    Ankle dorsiflexion     (Blank rows = not tested) (Key: WFL = within functional limits not formally assessed, * = concordant pain, s = stiffness/stretching sensation, NT = not tested)  Comments: full shoulder flex/abd ROM without pain   STRENGTH TESTING:  MMT Right eval Left eval Right 06/09/23 Left 06/09/23  Shoulder flexion 5 5 5 5   Shoulder abduction 5 5 5 5   Shoulder extension 5 5 5 5   Elbow flexion      Elbow extension      Grip strength (gross)      Hip flexion 4 4 * 4 4  Hip abduction (modified sitting) 4 4 4+ 4+  Knee flexion 4 4 4+ 4+  Knee extension 4+ 4+ 5 5  Ankle dorsiflexion 4+ 4+ 5 5  Ankle plantarflexion       (Blank rows = not tested) (Key: WFL = within functional limits not formally assessed, * = concordant pain, s = stiffness/stretching sensation, NT = not tested)  Comments:     FUNCTIONAL TESTS:  5xSTS: 20.08sec no UE support; rigid trunk mechanics with reduced fwd weightshifting 5xSTS: 7.99 seconds with no UE support, slight valgus bilaterally  GAIT: Distance walked: within clinic Assistive device utilized: None Level of assistance: Complete Independence Comments: reduced truncal rotation and arm swing, mildly reduced cadence  TREATMENT DATE:  06/23/23 Manual: STM lower lumbar paraspinals, grade II - III CPA L3-L5; grade II-III CPA thoracic spine and R and L UPA lumbar; manual traction with feet on green ball    06/17/23 Manual: STM lower lumbar paraspinals, grade II - III CPA L3-L5 Manual: STM to lumbar paraspinals pre and post dry needling for trigger point identification and  muscular relaxation. Trigger Point Dry Needling  Subsequent Treatment: Instructions provided previously at initial dry needling treatment.  Instructions reviewed, if requested by the patient, prior to subsequent dry needling treatment.   Patient Verbal Consent Given: Yes Education Handout Provided: Previously Provided Muscles Treated: R lumbar paraspinals Electrical Stimulation Performed: No Treatment Response/Outcome: decrease in tissue tension   Seated lat/paraspinal stretch 2 x 20 second holds  06/09/23 Reassessment   06/02/23 Manual: STM to suboccipitals, levator scapulae, UT Seated levator scapulae stretch 3 x 20 second holds Seated UT stretch 3 x 20 second holds Supine cervical retraction 2 x 10   05/24/23 Manual: STM lower lumbar paraspinals, grade II - III CPA L3-L5, supine manual traction with red swiss ball 4 x 1 minute Supine ab set and PPT with LE extension 2 x 10 Supine 90 - 90 ab set with shoulder flexion 2 x 10  Supine 90 -90 ab set with LE extension 2 x 10  1/2 kneeling hip flexor stretch with PPT/glute set 3 x 20 second holds Tall kneeling hip hinge 2 x 10  05/19/23 Manual: STM lower lumbar paraspinals, grade II - III CPA L3-L5 Press up 2 x 10 Standing lumbar extension 1 x 10 Standing lumbar ext at counter with hip dip 1 x 10  STS with RTB at knees 2 x 10 Palof press RTB 1 x 15  05/17/23 Manual: STM lower lumbar paraspinals, grade II - III CPA L3-L5 Standing lumbar extension 1 x 10 Palof press RTB 2 x 15 Resisted lift/chops RTB 2 x 10 each   [facility] Adult PT Treatment:                                                DATE: 05/13/23 Therapeutic Exercise: Supine cervical rotations x5 BIL cues for appropriate sensation and ROM Short lever open books x8 BIL  Standing cervical retraction w/ ball at wall 2x10 cues for reduced compensations  Manual Therapy: Supine; STM BIL paracervicals, suboccipitals. STM and trigger point release L LS with gentle passive pin and  stretch Prone: STM BIL superior glutes and lumbar paraspinals  Neuromuscular re-ed: 4 inch runner step up x8 BIL cues for sequencing and posture 4 inch runner step up + OH press (unweighted) x8 BIL, 1# x8 BIL, cues for pacing, posture, and appropriate mechanics    [facility] Adult PT Treatment:                                                DATE: 05/10/23 Therapeutic Exercise: Short lever open books 2x5 BIL w/ breath control  SKTC hooklying 2x30sec BIL  DKTC 2x30sec   Neuromuscular re-ed: Bridge x12 Bridge + ball squeeze x10 Hooklying red band pull down 2x8 cues for core contraction  Manual Therapy: Prone; STM BIL lumbar paraspinals, QL, superior glute; R>L    05/03/23 Manual: STM lower lumbar paraspinals, grade II CPA L4-5, grade II R UPA L4-5 Sidelying hip abduction 2 x 10 Prone hip extension 2 x 10 DKTC with heels on green ball 10 x 5-10 second holds Supine ab iso with ball 10 x 5 second holds Standing Row RTB 2 x 12 Standing shoulder extension RTB 2 x 12 Palof press 2 x 12 STS with RTB at knees    PATIENT EDUCATION:  Education details: rationale for interventions, HEP  Person educated: Patient Education method: Explanation, Demonstration, Tactile cues, Verbal cues Education comprehension: verbalized understanding, returned demonstration, verbal cues required, tactile cues required, and needs further education     HOME EXERCISE PROGRAM: Access Code: [email or web address] URL: [email or web address] Date: 04/21/2023 - Supine Posterior Pelvic Tilt  - 2-3 x daily - 1 sets - 8 reps - Supine March  - 2-3 x daily - 1 sets - 8 reps - Supine Cervical Rotation AROM on Pillow  - 2-3 x daily - 1 sets - 8 reps  04/26/23 - Standing Lumbar Extension  - 3 x daily - 7 x weekly - 1-2 sets - 10 reps - Seated Correct Posture  - 1 x daily - 7 x weekly - Supine Lower Trunk Rotation  - 1 x daily - 7 x weekly - 10 reps - 5-10 second hold - Supine Bridge  - 1 x daily - 7 x weekly - 2 sets -  10 reps  ASSESSMENT:  CLINICAL IMPRESSION: Session limited to patient's late arrival. Patient with continued low back symptoms which are worse today following working all day. Improvement in symptoms with manual to 3/10.  Patient will continue to benefit from skilled physical therapy in  order to improve function and reduce impairment.      Eval: Patient is a pleasant 58 y.o. woman who was seen today for physical therapy evaluation and treatment for neck/back pain. The former is chronic in nature and pt states is limiting, but has been stable for past few years. She states back pain is newer and more limiting, tends to affect majority of daily tasks/mobility and has culminated in reduced activity levels. On exam she demonstrates concordant limitations in cervical and lumbar mobility, reduced LE strength, and altered transfer mechanics. 5xSTS is indicative of fall risk and reduced functional mobility. Red flag screening reassuring today. Pt tolerates exam/HEP well overall without adverse event or increase in pain. Recommend trial of skilled PT to address aforementioned deficits with aim of improving functional tolerance and reducing pain with typical activities. Pt departs today's session in no acute distress, all voiced concerns/questions addressed appropriately from PT perspective.    OBJECTIVE IMPAIRMENTS: decreased activity tolerance, decreased endurance, decreased mobility, difficulty walking, decreased ROM, decreased strength, impaired perceived functional ability, improper body mechanics, postural dysfunction, and pain.   ACTIVITY LIMITATIONS: carrying, lifting, bending, sitting, standing, squatting, sleeping, transfers, dressing, and locomotion level  PARTICIPATION LIMITATIONS: meal prep, cleaning, laundry, driving, community activity, and occupation  PERSONAL FACTORS: Time since onset of injury/illness/exacerbation and 1-2 comorbidities: anxiety, asthma are also affecting patient's  functional outcome.   REHAB POTENTIAL: Good  CLINICAL DECISION MAKING: Evolving/moderate complexity  EVALUATION COMPLEXITY: Moderate   GOALS:  SHORT TERM GOALS: Target date: 05/19/2023  Pt will demonstrate appropriate understanding and performance of initially prescribed HEP in order to facilitate improved independence with management of symptoms.  Baseline: HEP established  Goal status: MET   2. Pt will report at least 25% improvement in overall pain levels over past week in order to facilitate improved tolerance to typical daily activities.   Baseline: neck 1-5/10 ; back 3-9/10 06/09/23: 40% improvement   Goal status: MET  LONG TERM GOALS: Target date: 06/16/2023   Pt will score less than or equal to 15/50 on NDI in order to demonstrate improved perception of function due to symptoms (MDC 10-13 pts per Adline Hook al 2009, 2010). Baseline: 28/50 06/09/23 17/50  Goal status: INITIAL   2.  Pt will demonstrate full/painless lumbar sidebending/extension AROM in order to demonstrate improved tolerance to functional movement patterns.   Baseline: see ROM chart above Goal status: INITIAL  3.  Pt will demonstrate hip flex/abd MMT of at least 4+/5 bilaterally in order to demonstrate improved strength for functional movements.  Baseline: see MMT chart above Goal status: INITIAL  4. Pt will perform 5xSTS in <12 sec in order to demonstrate reduced fall risk and improved functional independence. (MCID of 2.3sec)  Baseline: 20sec  06/09/23 5xSTS: 7.99 seconds with no UE support, slight valgus bilaterally  Goal status: MET  5. Pt will report at least 50% decrease in overall pain levels in past week in order to facilitate improved tolerance to basic ADLs/mobility.   Baseline: neck 1-5/10 ; back 3-9/10 06/09/23: 40%  Goal status: INITIAL    6. Pt will endorse ability to perform lower body dressing with less than 3 pt increase in pain in order to facilitate improved tolerance to ADLs.  Baseline: inc  pain w/ LBP 06/09/23: Still a problem getting back up  Goal status: INITIAL  7. Pt will endorse ability to stand/walk for >71min with no more than 1 rest break in order to facilitate improved tolerance to household tasks.  Baseline: inc  pain w/ standing > a couple minutes 06/09/23: standing max 5-10 minutes, walking 10-15 minutes  Goal status: INITIAL   PLAN:  PT FREQUENCY: 1-2x/week  PT DURATION: 6 weeks  PLANNED INTERVENTIONS: 97164- PT Re-evaluation, 97110-Therapeutic exercises, 97530- Therapeutic activity, 97112- Neuromuscular re-education, 97535- Self Care, 16109- Manual therapy, [phone]- Gait training, [phone]- Aquatic Therapy, [phone]- Electrical stimulation (unattended), Patient/Family education, Balance training, Stair training, Taping, Dry Needling, Joint mobilization, Spinal mobilization, Cryotherapy, and Moist heat.  PLAN FOR NEXT SESSION: Review/update HEP PRN. Work on [employer] exercises as appropriate with emphasis on comfortable mobility, core stability, and LE strengthening. Symptom modification strategies as indicated/appropriate.     Perfecto Bracket Tahra Hitzeman, PT 06/23/2023, 4:00 PM

## 2023-06-28 ENCOUNTER — Ambulatory Visit (HOSPITAL_BASED_OUTPATIENT_CLINIC_OR_DEPARTMENT_OTHER): Payer: Self-pay | Admitting: Physical Therapy

## 2023-07-05 ENCOUNTER — Encounter (HOSPITAL_BASED_OUTPATIENT_CLINIC_OR_DEPARTMENT_OTHER): Payer: Self-pay | Admitting: Physical Therapy

## 2023-07-11 ENCOUNTER — Other Ambulatory Visit (HOSPITAL_COMMUNITY): Payer: Self-pay | Admitting: Surgery

## 2023-07-11 DIAGNOSIS — M47816 Spondylosis without myelopathy or radiculopathy, lumbar region: Secondary | ICD-10-CM

## 2023-07-16 ENCOUNTER — Ambulatory Visit
Admission: RE | Admit: 2023-07-16 | Discharge: 2023-07-16 | Disposition: A | Discharge status: Home / Self Care | Source: Ambulatory Visit | Attending: Surgery | Admitting: Surgery

## 2023-07-16 DIAGNOSIS — M47816 Spondylosis without myelopathy or radiculopathy, lumbar region: Secondary | ICD-10-CM

## 2023-07-28 ENCOUNTER — Other Ambulatory Visit

## 2023-08-05 DIAGNOSIS — M461 Sacroiliitis, not elsewhere classified: Secondary | ICD-10-CM | POA: Diagnosis not present

## 2023-08-11 ENCOUNTER — Ambulatory Visit: Admitting: Physician Assistant

## 2023-08-11 ENCOUNTER — Encounter: Payer: Self-pay | Admitting: Physician Assistant

## 2023-08-11 VITALS — BP 120/68 | HR 72 | Temp 97.3°F | Ht 62.0 in | Wt 126.4 lb

## 2023-08-11 DIAGNOSIS — M25559 Pain in unspecified hip: Secondary | ICD-10-CM | POA: Insufficient documentation

## 2023-08-11 DIAGNOSIS — M79652 Pain in left thigh: Secondary | ICD-10-CM | POA: Diagnosis not present

## 2023-08-11 DIAGNOSIS — Z0000 Encounter for general adult medical examination without abnormal findings: Secondary | ICD-10-CM

## 2023-08-11 DIAGNOSIS — R21 Rash and other nonspecific skin eruption: Secondary | ICD-10-CM

## 2023-08-11 DIAGNOSIS — M461 Sacroiliitis, not elsewhere classified: Secondary | ICD-10-CM | POA: Insufficient documentation

## 2023-08-11 DIAGNOSIS — M79651 Pain in right thigh: Secondary | ICD-10-CM | POA: Diagnosis not present

## 2023-08-11 MED ORDER — HYDROCORTISONE 2.5 % EX CREA: TOPICAL_CREAM | Freq: Two times a day (BID) | CUTANEOUS | 0 refills | Status: DC

## 2023-08-11 NOTE — Patient Instructions (Addendum)
 Please schedule fasting labs in the next few weeks  Next physical in one year    VISIT SUMMARY: During your visit, we discussed your concerns about leg pain, a persistent rash, back pain, and lightheadedness. We reviewed your symptoms, family history, and current treatments, and provided recommendations for managing your conditions.  YOUR PLAN: VARICOSE VEINS: You have painful and tender veins in your legs, but there are no signs of blood clots. Your family history includes deep vein thrombosis. -Consider wearing compression stockings if your varicose veins worsen, especially in the calves. -Start taking an 81 mg aspirin daily to help thin your blood. -Gather more information from your brother about his specific blood tests and results related to his clotting disorder.  CHRONIC RASH: You have a persistent rash that has not improved with Plaquenil. It occasionally itches and burns, especially under your arms and when rubbed by clothing. -Contact your dermatologist to discuss whether you should continue Plaquenil, the potential for a biopsy, and further evaluation for autoimmune conditions. -Use hydrocortisone  cream 2.5% for symptomatic relief of the rash. Apply it sparingly and not for more than two weeks at a time.  BACK PAIN AND BURSITIS: You experience back pain and numbness in your feet, likely related to S1 involvement and possible bursitis in both hips. -Continue stretching exercises to manage your back pain.  GENERAL HEALTH MAINTENANCE: Your weight and blood pressure are well-managed, but you experience occasional lightheadedness, possibly due to low blood pressure. -Increase your salt intake to help manage low blood pressure symptoms. -Ensure you stay adequately hydrated.  FOLLOW-UP: You need to follow up on several health issues, including your rash and potential clotting disorder. -Schedule fasting labs in the next few weeks, including a cholesterol panel. -Contact your  dermatologist for further discussion on rash management and potential biopsy.                      Contains text generated by [employer].                                 Contains text generated by [employer]bridge.

## 2023-08-11 NOTE — Progress Notes (Signed)
 Patient ID: Ashley Murphy, female    DOB: [birth year], 58 y.o.   MRN: [id number]   Assessment & Plan:  Annual physical exam -     CBC with Differential/Platelet; Future -     Comprehensive metabolic panel with GFR; Future -     Lipid panel; Future -     TSH; Future  Rash and nonspecific skin eruption  Bilateral thigh pain  Other orders -     Hydrocortisone ; Apply topically 2 (two) times daily. Do not apply consistently more than 2 weeks at a time.  Dispense: 45 g; Refill: 0      Assessment and Plan Assessment & Plan Varicose Veins Ashley Murphy reports increased appearance of veins in her legs, accompanied by tenderness and pain. There is a family history of deep vein thrombosis (DVT) in her brother, who has undergone multiple procedures for vein clearance. The appearance of varicose veins is common with age due to gravity and weakening of vessel walls. There is no immediate concern for DVT as there are no signs of redness or swelling indicative of clots. Discussed the option of wearing compression stockings if symptoms worsen and the potential benefit of taking aspirin to thin the blood. Coagulation studies can be exhaustive and costly, so gathering more information from her brother about his condition is advised. - Consider wearing compression stockings if varicose veins worsen, especially in the calves. - Start taking an 81 mg aspirin daily to help thin the blood and provide relief. - Gather more information from her brother regarding specific blood tests and results related to his clotting disorder.  Chronic Rash Ashley Murphy has a persistent rash that has been evaluated by a dermatologist, who prescribed Plaquenil. The rash has not improved or worsened significantly over the past year. There is a differential diagnosis of granuloma annulare versus an autoimmune rash. Ashley Murphy experiences occasional itching and burning, particularly under her arms and when her pants rub against the rash.  Discussed the possibility of Plaquenil causing neuromuscular side effects, which could contribute to her muscle aches and pains. Consideration of discontinuing Plaquenil due to lack of improvement and potential side effects was discussed. A biopsy could provide more definitive diagnosis, and autoimmune labs may be considered, though Plaquenil might affect results. - Contact dermatologist to discuss the necessity of continuing Plaquenil, potential for biopsy, and further evaluation for autoimmune conditions. - Prescribe hydrocortisone  cream 2.5% for symptomatic relief of rash, to be used sparingly and not for more than two weeks at a time.  Back Pain and Bursitis Ashley Murphy experiences back pain and has been evaluated by a neurologist who suspects S1 involvement and possible bursitis in both hips. The pain limits her physical activity, and she experiences numbness in her feet, likely related to her back issues. Discussed the complexity of her symptoms, which may be exacerbated by Plaquenil, bursitis, or other factors. - Continue stretching exercises to manage back pain.  General Health Maintenance Ashley Murphy's weight and blood pressure are well-managed. She is up to date with most screenings and continues to take thyroid  medication. She experiences occasional lightheadedness, possibly due to low blood pressure, especially when moving quickly or on uneven surfaces. Discussed increasing salt intake to help manage symptoms. - Increase salt intake to help manage low blood pressure symptoms. - Ensure adequate hydration.  Follow-up Ashley Murphy needs to follow up on several health issues, including her rash and potential clotting disorder. Discussed the importance of scheduling labs and contacting her dermatologist for further evaluation. - Schedule fasting  labs in the next few weeks, including a cholesterol panel. - Contact dermatologist for further discussion on rash management and potential biopsy.     Return in  about 1 year (around 08/10/2024) for physical.    Subjective:    Chief Complaint  Patient presents with   Annual Exam    HPI Discussed the use of AI scribe software for clinical note transcription with the patient, who gave verbal consent to proceed.  History of Present Illness Ashley Murphy is a 58 year old female who presents with concerns about leg pain and a persistent rash.  She has leg pain associated with an increase in the appearance of veins in a specific area, which are painful and tender. There is no significant redness or swelling. Her brother has been diagnosed with deep vein thrombosis, raising her concern about a possible connection.  She has a persistent rash that has not improved despite being on Plaquenil for over a year. The rash is suspected to be autoimmune in nature, possibly granuloma annulare, but this has not been confirmed. It occasionally itches, especially under her arms, and sometimes burns when rubbed by clothing. It becomes more inflamed and raised at times, turning slightly blue, but is currently calm. She requests hydrocortisone  ointment for relief.  She experiences joint pain, particularly in her neck and back, and reports fatigue, which she attributes to poor sleep. No joint stiffness or swelling, but she mentions back and knee pain. She has a history of lower back issues, with a recent evaluation suggesting S1 involvement and possible bursitis in both hips.  She experiences lightheadedness when moving quickly, particularly on uneven surfaces, and wonders if it is related to low blood pressure. She is not on blood pressure medication. She also reports numbness in her feet, which she attributes to her back issues.  Her family history includes her brother's deep vein thrombosis and her father's history of aneurysms and blood clots in the lungs. She has not experienced similar clotting issues herself.     Past Medical History:  Diagnosis Date    Anemia    Anxiety    Asthma    Family history of colon cancer    Family history of melanoma    Thyroid  disease    hypothyroid   Urinary incontinence    not severe    Past Surgical History:  Procedure Laterality Date   ANKLE ARTHROSCOPY WITH RECONSTRUCTION Right    CESAREAN SECTION      Family History  Problem Relation Age of Onset   Arthritis Mother    Cancer Mother 55       likely from reproductive organs    Arthritis Father    Heart disease Father    Hypertension Father    Hyperlipidemia Father    Melanoma Father    Alzheimer's disease Paternal Grandmother    Cancer Paternal Grandfather    Colon cancer Paternal Grandfather    Breast cancer Maternal Great-grandmother    Esophageal cancer Neg Hx    Rectal cancer Neg Hx    Stomach cancer Neg Hx     Social History   Tobacco Use   Smoking status: Former   Smokeless tobacco: Never   Tobacco comments:    Quit 30 years ago  Substance Use Topics   Alcohol use: Yes    Alcohol/week: 0.0 standard drinks of alcohol    Comment: socially   Drug use: No     No Known Allergies  Review of Systems NEGATIVE UNLESS  OTHERWISE INDICATED IN HPI      Objective:     BP 120/68 (BP Location: Left Arm, Patient Position: Sitting, Cuff Size: Normal)   Pulse 72   Temp (!) 97.3 F (36.3 C) (Temporal)   Ht 5' 2 (1.575 m)   Wt 126 lb 6.4 oz (57.3 kg)   SpO2 98%   BMI 23.12 kg/m   Wt Readings from Last 3 Encounters:  08/11/23 126 lb 6.4 oz (57.3 kg)  03/08/23 127 lb 12.8 oz (58 kg)  09/10/22 127 lb (57.6 kg)    BP Readings from Last 3 Encounters:  08/11/23 120/68  03/08/23 126/80  09/10/22 112/70     Physical Exam Vitals and nursing note reviewed.  Constitutional:      Appearance: Normal appearance.  Cardiovascular:     Rate and Rhythm: Normal rate and regular rhythm.  Pulmonary:     Effort: Pulmonary effort is normal.  Abdominal:     General: Abdomen is flat. Bowel sounds are normal.     Palpations: Abdomen  is soft. There is no mass.     Tenderness: There is no abdominal tenderness. There is no right CVA tenderness, left CVA tenderness or guarding.     Hernia: No hernia is present.  Musculoskeletal:     Lumbar back: Tenderness (diffuse low back) present. No spasms. Normal range of motion. Negative right straight leg raise test and negative left straight leg raise test.     Right hip: Normal. No tenderness or bony tenderness. Normal range of motion.     Left hip: Normal. No tenderness or bony tenderness. Normal range of motion.     Right lower leg: No edema.     Left lower leg: No edema.  Skin:    Findings: Rash (faint patches lower back and abdomen) present.  Neurological:     General: No focal deficit present.     Mental Status: She is alert.     Sensory: No sensory deficit.     Motor: No weakness.     Gait: Gait normal.  Psychiatric:        Mood and Affect: Mood normal.       Ashley Murphy M Daemyn Gariepy, PA-C

## 2023-08-18 ENCOUNTER — Other Ambulatory Visit

## 2023-08-18 DIAGNOSIS — Z0000 Encounter for general adult medical examination without abnormal findings: Secondary | ICD-10-CM | POA: Diagnosis not present

## 2023-08-18 LAB — COMPREHENSIVE METABOLIC PANEL WITH GFR
ALT: 12 U/L (ref 0–35)
AST: 17 U/L (ref 0–37)
Albumin: 4.7 g/dL (ref 3.5–5.2)
Alkaline Phosphatase: 49 U/L (ref 39–117)
BUN: 14 mg/dL (ref 6–23)
CO2: 29 meq/L (ref 19–32)
Calcium: 9.3 mg/dL (ref 8.4–10.5)
Chloride: 102 meq/L (ref 96–112)
Creatinine, Ser: 0.75 mg/dL (ref 0.40–1.20)
GFR: 87.01 mL/min (ref >60.00)
Glucose, Bld: 83 mg/dL (ref 70–99)
Potassium: 4 meq/L (ref 3.5–5.1)
Sodium: 137 meq/L (ref 135–145)
Total Bilirubin: 0.7 mg/dL (ref 0.2–1.2)
Total Protein: 7.2 g/dL (ref 6.0–8.3)

## 2023-08-18 LAB — CBC WITH DIFFERENTIAL/PLATELET
Basophils Absolute: 0 K/uL (ref 0.0–0.1)
Basophils Relative: 0.5 % (ref 0.0–3.0)
Eosinophils Absolute: 0 K/uL (ref 0.0–0.7)
Eosinophils Relative: 0.7 % (ref 0.0–5.0)
HCT: 39 % (ref 36.0–46.0)
Hemoglobin: 13.3 g/dL (ref 12.0–15.0)
Lymphocytes Relative: 23.4 % (ref 12.0–46.0)
Lymphs Abs: 1.1 K/uL (ref 0.7–4.0)
MCHC: 34.1 g/dL (ref 30.0–36.0)
MCV: 92.9 fl (ref 78.0–100.0)
Monocytes Absolute: 0.4 K/uL (ref 0.1–1.0)
Monocytes Relative: 9 % (ref 3.0–12.0)
Neutro Abs: 3.2 K/uL (ref 1.4–7.7)
Neutrophils Relative %: 66.4 % (ref 43.0–77.0)
Platelets: 210 K/uL (ref 150.0–400.0)
RBC: 4.19 Mil/uL (ref 3.87–5.11)
RDW: 12.3 % (ref 11.5–15.5)
WBC: 4.8 K/uL (ref 4.0–10.5)

## 2023-08-18 LAB — LIPID PANEL
Cholesterol: 195 mg/dL (ref 0–200)
HDL: 63.8 mg/dL (ref >39.00)
LDL Cholesterol: 114 mg/dL — ABNORMAL HIGH (ref 0–99)
NonHDL: 131.42
Total CHOL/HDL Ratio: 3
Triglycerides: 85 mg/dL (ref 0.0–149.0)
VLDL: 17 mg/dL (ref 0.0–40.0)

## 2023-08-18 LAB — TSH: TSH: 1.68 u[IU]/mL (ref 0.35–5.50)

## 2023-08-19 ENCOUNTER — Ambulatory Visit: Payer: Self-pay | Admitting: Physician Assistant

## 2023-08-20 ENCOUNTER — Encounter: Payer: Self-pay | Admitting: Physician Assistant

## 2023-08-22 ENCOUNTER — Other Ambulatory Visit: Payer: Self-pay

## 2023-08-22 MED ORDER — HYDROCORTISONE 2.5 % EX CREA: TOPICAL_CREAM | Freq: Two times a day (BID) | CUTANEOUS | 0 refills | Status: AC

## 2023-08-31 DIAGNOSIS — M461 Sacroiliitis, not elsewhere classified: Secondary | ICD-10-CM | POA: Diagnosis not present

## 2023-09-11 ENCOUNTER — Encounter: Payer: Self-pay | Admitting: Physician Assistant

## 2023-09-12 ENCOUNTER — Encounter: Admitting: Physician Assistant

## 2023-09-12 ENCOUNTER — Other Ambulatory Visit: Payer: Self-pay

## 2023-09-12 DIAGNOSIS — E039 Hypothyroidism, unspecified: Secondary | ICD-10-CM

## 2023-09-12 DIAGNOSIS — Z1231 Encounter for screening mammogram for malignant neoplasm of breast: Secondary | ICD-10-CM | POA: Diagnosis not present

## 2023-09-12 LAB — HM MAMMOGRAPHY

## 2023-09-12 MED ORDER — LEVOTHYROXINE SODIUM 88 MCG PO TABS: 88.0000 ug | ORAL_TABLET | Freq: Every day | ORAL | 3 refills | Status: DC

## 2023-09-14 ENCOUNTER — Ambulatory Visit (INDEPENDENT_AMBULATORY_CARE_PROVIDER_SITE_OTHER): Admitting: Radiology

## 2023-09-14 ENCOUNTER — Encounter: Payer: Self-pay | Admitting: Radiology

## 2023-09-14 VITALS — BP 116/74 | HR 73 | Ht 62.0 in | Wt 124.0 lb

## 2023-09-14 DIAGNOSIS — Z1331 Encounter for screening for depression: Secondary | ICD-10-CM

## 2023-09-14 DIAGNOSIS — Z79890 Hormone replacement therapy (postmenopausal): Secondary | ICD-10-CM | POA: Diagnosis not present

## 2023-09-14 DIAGNOSIS — Z01419 Encounter for gynecological examination (general) (routine) without abnormal findings: Secondary | ICD-10-CM | POA: Diagnosis not present

## 2023-09-14 MED ORDER — ESTRADIOL 0.075 MG/24HR TD PTTW: 1.0000 | MEDICATED_PATCH | TRANSDERMAL | 4 refills | Status: AC

## 2023-09-14 MED ORDER — PROGESTERONE MICRONIZED 100 MG PO CAPS: 100.0000 mg | ORAL_CAPSULE | Freq: Every evening | ORAL | 4 refills | Status: AC

## 2023-09-14 NOTE — Progress Notes (Signed)
 Ashley Murphy 04/07/1965 [id number]   History: Postmenopausal 58 y.o. presents for annual exam. Using vivelle  dot, progesterone  and Mirena  (2021) for HRT. Having breakthrough hot flashes.   Gynecologic History Postmenopausal Last Pap: 2024. Results were: normal Last mammogram: 09/03/22. Results were: normal Last colonoscopy: 2017   Obstetric History OB History  Gravida Para Term Preterm AB Living  2 2  2  2   SAB IAB Ectopic Multiple Live Births      2    # Outcome Date GA Lbr Len/2nd Weight Sex Type Anes PTL Lv  2 Preterm     M VBAC  Y LIV  1 Preterm     M CS-Unspec   LIV      09/14/2023    3:58 PM 08/11/2023    1:18 PM 07/08/2022    9:15 AM 06/30/2021    7:37 AM 06/16/2020    8:32 AM  Depression screen PHQ 2/9  Decreased Interest 0 0 0 0 0  Down, Depressed, Hopeless 0 0 0 0 0  PHQ - 2 Score 0 0 0 0 0  Altered sleeping  0 0    Tired, decreased energy  0 0    Change in appetite  0 0    Feeling bad or failure about yourself   0 0    Trouble concentrating  0 0    Moving slowly or fidgety/restless  0 0    Suicidal thoughts  0 0    PHQ-9 Score  0 0    Difficult doing work/chores  Not difficult at all Not difficult at all       The following portions of the patient's history were reviewed and updated as appropriate: allergies, current medications, past family history, past medical history, past social history, past surgical history, and problem list.  Review of Systems Pertinent items noted in HPI and remainder of comprehensive ROS otherwise negative.  Past medical history, past surgical history, family history and social history were all reviewed and documented in the EPIC chart.  Exam:  Vitals:   09/14/23 1556  BP: 116/74  Pulse: 73  SpO2: 97%  Weight: 124 lb (56.2 kg)  Height: 5' 2 (1.575 m)    Body mass index is 22.68 kg/m.  General appearance:  Normal Thyroid :  Symmetrical, normal in size, without palpable masses or  nodularity. Respiratory  Auscultation:  Clear without wheezing or rhonchi Cardiovascular  Auscultation:  Regular rate, without rubs, murmurs or gallops  Edema/varicosities:  Not grossly evident Abdominal  Soft,nontender, without masses, guarding or rebound.  Liver/spleen:  No organomegaly noted  Hernia:  None appreciated  Skin  Inspection:  Grossly normal Breasts: Examined lying and sitting.   Right: Without masses, retractions, nipple discharge or axillary adenopathy.   Left: Without masses, retractions, nipple discharge or axillary adenopathy. Genitourinary   Inguinal/mons:  Normal without inguinal adenopathy  External genitalia:  Normal appearing vulva with no masses, tenderness, or lesions  BUS/Urethra/Skene's glands:  Normal  Vagina:  Normal appearing with normal color and discharge, no lesions. Atrophy: mild   Cervix:  Normal appearing without discharge or lesions. IUD strings seen  Uterus:  Normal in size, shape and contour.  Midline and mobile, nontender  Adnexa/parametria:     Rt: Normal in size, without masses or tenderness.   Lt: Normal in size, without masses or tenderness.  Anus and perineum: Normal    Darice Hoit, CMA present for exam  Assessment/Plan:   1. Well woman exam with routine gynecological exam (  Primary) Pap 2027  2. Hormone replacement therapy - May leave IUD in place until 2029 - estradiol  (VIVELLE -DOT) 0.075 MG/24HR; Place 1 patch onto the skin 2 (two) times a week.  Dispense: 24 patch; Refill: 4 - progesterone  (PROMETRIUM ) 100 MG capsule; Take 1 capsule (100 mg total) by mouth at bedtime.  Dispense: 90 capsule; Refill: 4  3. Depression screen   Charene Mccallister B WHNP-BC, 4:10 PM 09/14/2023

## 2023-10-03 DIAGNOSIS — M461 Sacroiliitis, not elsewhere classified: Secondary | ICD-10-CM | POA: Diagnosis not present

## 2023-10-12 ENCOUNTER — Encounter: Admitting: Physician Assistant

## 2023-10-28 DIAGNOSIS — Z713 Dietary counseling and surveillance: Secondary | ICD-10-CM | POA: Diagnosis not present

## 2023-12-01 ENCOUNTER — Other Ambulatory Visit: Payer: Self-pay

## 2023-12-01 DIAGNOSIS — E039 Hypothyroidism, unspecified: Secondary | ICD-10-CM

## 2023-12-01 MED ORDER — LEVOTHYROXINE SODIUM 88 MCG PO TABS: 88.0000 ug | ORAL_TABLET | Freq: Every day | ORAL | 3 refills | Status: AC

## 2023-12-08 DIAGNOSIS — M461 Sacroiliitis, not elsewhere classified: Secondary | ICD-10-CM | POA: Diagnosis not present

## 2024-01-20 DIAGNOSIS — M461 Sacroiliitis, not elsewhere classified: Secondary | ICD-10-CM | POA: Diagnosis not present

## 2024-01-23 ENCOUNTER — Other Ambulatory Visit: Payer: Self-pay | Admitting: Neurosurgery

## 2024-01-23 DIAGNOSIS — M461 Sacroiliitis, not elsewhere classified: Secondary | ICD-10-CM

## 2024-01-25 ENCOUNTER — Other Ambulatory Visit: Payer: Self-pay | Admitting: Neurosurgery

## 2024-01-25 DIAGNOSIS — M461 Sacroiliitis, not elsewhere classified: Secondary | ICD-10-CM

## 2024-02-08 ENCOUNTER — Ambulatory Visit
Admission: RE | Admit: 2024-02-08 | Discharge: 2024-02-08 | Disposition: A | Discharge status: Home / Self Care | Source: Ambulatory Visit | Attending: Neurosurgery | Admitting: Neurosurgery

## 2024-02-08 ENCOUNTER — Inpatient Hospital Stay
Admission: RE | Admit: 2024-02-08 | Discharge: 2024-02-08 | Disposition: A | Source: Ambulatory Visit | Attending: Neurosurgery

## 2024-02-08 DIAGNOSIS — M461 Sacroiliitis, not elsewhere classified: Secondary | ICD-10-CM

## 2024-02-08 MED ORDER — IOPAMIDOL (ISOVUE-M 200) INJECTION 41%: 1.0000 mL | Freq: Once | INTRAMUSCULAR | Status: DC | PRN

## 2024-08-15 ENCOUNTER — Encounter: Admitting: Physician Assistant
